# Patient Record
Sex: Female | Born: 1935 | Race: White | Hispanic: No | Marital: Single | Smoking: Former smoker
Health system: Southern US, Community
[De-identification: ages and names within clinical notes are randomized; demographics above are authoritative.]

## PROBLEM LIST (undated history)

## (undated) DIAGNOSIS — I10 Essential (primary) hypertension: Secondary | ICD-10-CM

## (undated) DIAGNOSIS — I495 Sick sinus syndrome: Secondary | ICD-10-CM

## (undated) DIAGNOSIS — H269 Unspecified cataract: Secondary | ICD-10-CM

## (undated) DIAGNOSIS — R112 Nausea with vomiting, unspecified: Secondary | ICD-10-CM

## (undated) DIAGNOSIS — Z860100 Personal history of colon polyps, unspecified: Secondary | ICD-10-CM

## (undated) DIAGNOSIS — C4490 Unspecified malignant neoplasm of skin, unspecified: Secondary | ICD-10-CM

## (undated) DIAGNOSIS — M8580 Other specified disorders of bone density and structure, unspecified site: Secondary | ICD-10-CM

## (undated) DIAGNOSIS — I480 Paroxysmal atrial fibrillation: Secondary | ICD-10-CM

## (undated) DIAGNOSIS — E785 Hyperlipidemia, unspecified: Secondary | ICD-10-CM

## (undated) DIAGNOSIS — Z9889 Other specified postprocedural states: Secondary | ICD-10-CM

## (undated) DIAGNOSIS — R55 Syncope and collapse: Secondary | ICD-10-CM

## (undated) HISTORY — DX: Sick sinus syndrome: I49.5

## (undated) HISTORY — DX: Essential (primary) hypertension: I10

## (undated) HISTORY — DX: Unspecified malignant neoplasm of skin, unspecified: C44.90

## (undated) HISTORY — PX: BREAST BIOPSY: SHX20

## (undated) HISTORY — DX: Unspecified cataract: H26.9

## (undated) HISTORY — DX: Syncope and collapse: R55

## (undated) HISTORY — DX: Personal history of colon polyps, unspecified: Z86.0100

## (undated) HISTORY — PX: COLONOSCOPY W/ BIOPSIES: SHX1374

## (undated) HISTORY — DX: Paroxysmal atrial fibrillation: I48.0

## (undated) HISTORY — PX: TOTAL ABDOMINAL HYSTERECTOMY: SHX209

## (undated) HISTORY — DX: Hyperlipidemia, unspecified: E78.5

## (undated) HISTORY — DX: Other specified disorders of bone density and structure, unspecified site: M85.80

## (undated) HISTORY — PX: HEMORRHOID SURGERY: SHX153

## (undated) HISTORY — DX: Personal history of colonic polyps: Z86.010

## (undated) HISTORY — PX: OTHER SURGICAL HISTORY: SHX169

## (undated) HISTORY — PX: APPENDECTOMY: SHX54

---

## 2004-07-08 ENCOUNTER — Ambulatory Visit: Payer: Self-pay | Admitting: Internal Medicine

## 2004-07-15 ENCOUNTER — Ambulatory Visit: Payer: Self-pay | Admitting: Internal Medicine

## 2004-10-15 ENCOUNTER — Ambulatory Visit: Payer: Self-pay | Admitting: Internal Medicine

## 2005-01-20 ENCOUNTER — Ambulatory Visit: Payer: Self-pay | Admitting: Internal Medicine

## 2005-07-19 ENCOUNTER — Ambulatory Visit: Payer: Self-pay | Admitting: Internal Medicine

## 2005-11-02 DIAGNOSIS — R55 Syncope and collapse: Secondary | ICD-10-CM

## 2005-11-02 HISTORY — DX: Syncope and collapse: R55

## 2005-12-04 ENCOUNTER — Emergency Department (HOSPITAL_COMMUNITY): Admission: EM | Admit: 2005-12-04 | Discharge: 2005-12-04 | Payer: Self-pay | Admitting: Emergency Medicine

## 2005-12-06 ENCOUNTER — Ambulatory Visit: Payer: Self-pay | Admitting: Internal Medicine

## 2005-12-08 HISTORY — PX: US ECHOCARDIOGRAPHY: HXRAD669

## 2006-01-18 ENCOUNTER — Ambulatory Visit: Payer: Self-pay | Admitting: Internal Medicine

## 2006-01-18 LAB — CONVERTED CEMR LAB
ALT: 33 units/L (ref 0–40)
AST: 41 units/L — ABNORMAL HIGH (ref 0–37)

## 2006-04-04 HISTORY — PX: OTHER SURGICAL HISTORY: SHX169

## 2006-05-24 ENCOUNTER — Ambulatory Visit: Payer: Self-pay | Admitting: Internal Medicine

## 2006-05-24 LAB — CONVERTED CEMR LAB
ALT: 27 units/L (ref 0–40)
Calcium: 9.1 mg/dL (ref 8.4–10.5)
Chloride: 105 meq/L (ref 96–112)
GFR calc Af Amer: 89 mL/min
GFR calc non Af Amer: 74 mL/min
LDL Cholesterol: 91 mg/dL (ref 0–99)
Total CK: 49 units/L (ref 7–177)
VLDL: 12 mg/dL (ref 0–40)

## 2006-06-03 DIAGNOSIS — Z950 Presence of cardiac pacemaker: Secondary | ICD-10-CM

## 2006-06-20 ENCOUNTER — Ambulatory Visit (HOSPITAL_COMMUNITY): Admission: RE | Admit: 2006-06-20 | Discharge: 2006-06-21 | Payer: Self-pay | Admitting: *Deleted

## 2006-08-31 ENCOUNTER — Ambulatory Visit: Payer: Self-pay | Admitting: Internal Medicine

## 2006-08-31 ENCOUNTER — Encounter: Payer: Self-pay | Admitting: Internal Medicine

## 2006-09-28 ENCOUNTER — Encounter: Payer: Self-pay | Admitting: Internal Medicine

## 2006-10-03 ENCOUNTER — Ambulatory Visit: Payer: Self-pay | Admitting: Internal Medicine

## 2006-10-03 DIAGNOSIS — Z9189 Other specified personal risk factors, not elsewhere classified: Secondary | ICD-10-CM | POA: Insufficient documentation

## 2006-10-03 DIAGNOSIS — M899 Disorder of bone, unspecified: Secondary | ICD-10-CM | POA: Insufficient documentation

## 2006-10-03 DIAGNOSIS — I10 Essential (primary) hypertension: Secondary | ICD-10-CM

## 2006-10-03 DIAGNOSIS — R928 Other abnormal and inconclusive findings on diagnostic imaging of breast: Secondary | ICD-10-CM | POA: Insufficient documentation

## 2006-10-03 DIAGNOSIS — E7800 Pure hypercholesterolemia, unspecified: Secondary | ICD-10-CM

## 2006-10-03 DIAGNOSIS — M949 Disorder of cartilage, unspecified: Secondary | ICD-10-CM

## 2006-10-09 LAB — CONVERTED CEMR LAB
ALT: 37 units/L — ABNORMAL HIGH (ref 0–35)
AST: 44 units/L — ABNORMAL HIGH (ref 0–37)
BUN: 15 mg/dL (ref 6–23)
CO2: 30 meq/L (ref 19–32)
GFR calc Af Amer: 124 mL/min
Potassium: 4.7 meq/L (ref 3.5–5.1)
Sodium: 141 meq/L (ref 135–145)

## 2007-02-09 ENCOUNTER — Ambulatory Visit: Payer: Self-pay | Admitting: Internal Medicine

## 2007-02-12 LAB — CONVERTED CEMR LAB
ALT: 24 units/L (ref 0–35)
AST: 33 units/L (ref 0–37)
Basophils Relative: 0.6 % (ref 0.0–1.0)
Bilirubin, Direct: 0.1 mg/dL (ref 0.0–0.3)
CO2: 27 meq/L (ref 19–32)
Calcium: 9 mg/dL (ref 8.4–10.5)
Chloride: 103 meq/L (ref 96–112)
Creatinine, Ser: 0.9 mg/dL (ref 0.4–1.2)
Eosinophils Absolute: 0.1 10*3/uL (ref 0.0–0.6)
Eosinophils Relative: 1.8 % (ref 0.0–5.0)
Glucose, Bld: 94 mg/dL (ref 70–99)
HCT: 39.4 % (ref 36.0–46.0)
Iron: 88 ug/dL (ref 42–145)
MCV: 84.8 fL (ref 78.0–100.0)
Neutrophils Relative %: 59.4 % (ref 43.0–77.0)
Platelets: 240 10*3/uL (ref 150–400)
RBC: 4.65 M/uL (ref 3.87–5.11)
Sodium: 141 meq/L (ref 135–145)
Total Bilirubin: 0.9 mg/dL (ref 0.3–1.2)
Total CHOL/HDL Ratio: 2.1
Total Protein: 6.7 g/dL (ref 6.0–8.3)
VLDL: 10 mg/dL (ref 0–40)
WBC: 6.2 10*3/uL (ref 4.5–10.5)

## 2007-03-26 ENCOUNTER — Encounter: Payer: Self-pay | Admitting: Internal Medicine

## 2007-05-04 ENCOUNTER — Ambulatory Visit: Payer: Self-pay | Admitting: Internal Medicine

## 2007-05-11 ENCOUNTER — Telehealth: Payer: Self-pay | Admitting: Internal Medicine

## 2007-07-03 ENCOUNTER — Encounter: Payer: Self-pay | Admitting: Internal Medicine

## 2007-09-24 ENCOUNTER — Encounter: Payer: Self-pay | Admitting: Internal Medicine

## 2007-11-06 ENCOUNTER — Ambulatory Visit: Payer: Self-pay | Admitting: Internal Medicine

## 2007-11-27 ENCOUNTER — Encounter: Payer: Self-pay | Admitting: Internal Medicine

## 2007-11-27 ENCOUNTER — Ambulatory Visit: Payer: Self-pay | Admitting: Internal Medicine

## 2007-11-27 LAB — HM COLONOSCOPY

## 2007-11-29 ENCOUNTER — Encounter: Payer: Self-pay | Admitting: Internal Medicine

## 2008-03-24 ENCOUNTER — Encounter: Payer: Self-pay | Admitting: Internal Medicine

## 2008-04-04 DIAGNOSIS — C4490 Unspecified malignant neoplasm of skin, unspecified: Secondary | ICD-10-CM

## 2008-04-04 HISTORY — DX: Unspecified malignant neoplasm of skin, unspecified: C44.90

## 2008-06-13 ENCOUNTER — Ambulatory Visit: Payer: Self-pay | Admitting: Family Medicine

## 2008-06-13 DIAGNOSIS — R109 Unspecified abdominal pain: Secondary | ICD-10-CM | POA: Insufficient documentation

## 2008-09-29 ENCOUNTER — Encounter: Payer: Self-pay | Admitting: Internal Medicine

## 2008-09-29 LAB — CONVERTED CEMR LAB
Albumin: 4.3 g/dL
Alkaline Phosphatase: 72 units/L
BUN: 21 mg/dL
Creatinine, Ser: 0.8 mg/dL
Glucose, Bld: 97 mg/dL
HDL: 99 mg/dL
LDL Cholesterol: 107 mg/dL
Potassium, serum: 4.5 mmol/L
Total Protein: 7.5 g/dL
Triglycerides: 102 mg/dL

## 2008-10-09 ENCOUNTER — Ambulatory Visit: Payer: Self-pay | Admitting: Family Medicine

## 2008-10-09 ENCOUNTER — Encounter: Payer: Self-pay | Admitting: Internal Medicine

## 2008-11-10 ENCOUNTER — Encounter: Payer: Self-pay | Admitting: Internal Medicine

## 2008-11-20 ENCOUNTER — Encounter (INDEPENDENT_AMBULATORY_CARE_PROVIDER_SITE_OTHER): Payer: Self-pay | Admitting: *Deleted

## 2009-06-15 ENCOUNTER — Encounter: Payer: Self-pay | Admitting: Internal Medicine

## 2009-06-16 ENCOUNTER — Encounter: Payer: Self-pay | Admitting: Internal Medicine

## 2009-06-17 ENCOUNTER — Encounter: Payer: Self-pay | Admitting: Internal Medicine

## 2009-06-17 LAB — CONVERTED CEMR LAB
ALT: 32 units/L
AST: 44 units/L
Albumin: 4.1 g/dL
Alkaline Phosphatase: 75 units/L
Chloride, Serum: 104 mmol/L
Cholesterol: 208 mg/dL
Creatinine, Ser: 0.8 mg/dL
Glucose, Bld: 98 mg/dL
Potassium, serum: 5.4 mmol/L
Total Bilirubin: 0.6 mg/dL

## 2009-06-23 ENCOUNTER — Ambulatory Visit: Payer: Self-pay | Admitting: Internal Medicine

## 2009-06-24 LAB — CONVERTED CEMR LAB
Basophils Relative: 0.9 % (ref 0.0–3.0)
HCT: 41.9 % (ref 36.0–46.0)
Hemoglobin: 13.7 g/dL (ref 12.0–15.0)
Lymphocytes Relative: 27.1 % (ref 12.0–46.0)
Lymphs Abs: 1.5 10*3/uL (ref 0.7–4.0)
Monocytes Relative: 10.5 % (ref 3.0–12.0)
Neutro Abs: 3.2 10*3/uL (ref 1.4–7.7)
RBC: 4.71 M/uL (ref 3.87–5.11)

## 2009-09-23 ENCOUNTER — Ambulatory Visit: Payer: Self-pay | Admitting: Internal Medicine

## 2010-01-09 ENCOUNTER — Encounter: Payer: Self-pay | Admitting: Internal Medicine

## 2010-01-21 ENCOUNTER — Ambulatory Visit: Payer: Self-pay | Admitting: Internal Medicine

## 2010-04-22 ENCOUNTER — Ambulatory Visit
Admission: RE | Admit: 2010-04-22 | Discharge: 2010-04-22 | Payer: Self-pay | Source: Home / Self Care | Attending: Internal Medicine | Admitting: Internal Medicine

## 2010-04-22 ENCOUNTER — Encounter: Payer: Self-pay | Admitting: Internal Medicine

## 2010-04-22 DIAGNOSIS — I4821 Permanent atrial fibrillation: Secondary | ICD-10-CM | POA: Insufficient documentation

## 2010-04-22 DIAGNOSIS — I4891 Unspecified atrial fibrillation: Secondary | ICD-10-CM | POA: Insufficient documentation

## 2010-05-04 NOTE — Assessment & Plan Note (Signed)
Summary: shingles vac/cbs  Nurse Visit   Allergies: No Known Drug Allergies  Immunizations Administered:  Zostavax # 1:    Vaccine Type: Zostavax    Site: RIGHT [place]    Mfr: Merck    Dose: 0.65    Route: [place]    Given by: Jeremy Johann CMA    Exp. Date: 10/28/2010    Lot #: [id number]    VIS given: 01/14/05 given September 23, 2009.  Orders Added: 1)  Zoster (Shingles) Vaccine Live [90736] 2)  Admin 1st Vaccine [phone]

## 2010-05-04 NOTE — Miscellaneous (Signed)
Summary: Device preload  Clinical Lists Changes  Observations: Added new observation of PPM INDICATN: CHB (01/09/2010 12:51) Added new observation of MAGNET RTE: BOL 85 ERI 65 (01/09/2010 12:51) Added new observation of PPMLEADSTAT2: active (01/09/2010 12:51) Added new observation of PPMLEADSER2: [id number] (01/09/2010 12:51) Added new observation of PPMLEADMOD2: 5076  (01/09/2010 12:51) Added new observation of PPMLEADDOI2: 06/20/2006  (01/09/2010 12:51) Added new observation of PPMLEADLOC2: RV  (01/09/2010 12:51) Added new observation of PPMLEADSTAT1: active  (01/09/2010 12:51) Added new observation of PPMLEADSER1: [id number]  (01/09/2010 12:51) Added new observation of PPMLEADMOD1: 5076  (01/09/2010 12:51) Added new observation of PPMLEADDOI1: 06/20/2006  (01/09/2010 12:51) Added new observation of PPMLEADLOC1: RA  (01/09/2010 12:51) Added new observation of PPM IMP MD: Charlynn Court  (01/09/2010 12:51) Added new observation of PPM DOI: 06/20/2006  (01/09/2010 12:51) Added new observation of PPM SERL#: [id number]  (01/09/2010 12:51) Added new observation of PPM MODL#: [id number]  (01/09/2010 65:78) Added new observation of PACEMAKERMFG: Medtronic  (01/09/2010 12:51) Added new observation of PPM REFER MD: Vonna Drafts  (01/09/2010 12:51) Added new observation of PACEMAKER MD: Hillis Range, MD  (01/09/2010 12:51)      PPM Specifications Following MD:  Hillis Range, MD     Referring MD:  Vonna Drafts PPM Vendor:  Medtronic     PPM Model Number:  ADDR01     PPM Serial Number:  [id number] PPM DOI:  06/20/2006     PPM Implanting MD:  Charlynn Court  Lead 1    Location: RA     DOI: 06/20/2006     Model #: [id number]     Serial #: [id number]     Status: active Lead 2    Location: RV     DOI: 06/20/2006     Model #: [id number]     Serial #: [id number]     Status: active  Magnet Response Rate:  BOL 85 ERI 65  Indications:  CHB

## 2010-05-04 NOTE — Miscellaneous (Signed)
Summary: LABS FROM [facility]  Clinical Lists Changes  Observations: Added new observation of SGPT (ALT): 32 units/L (06/17/2009 8:04) Added new observation of SGOT (AST): 44 units/L (06/17/2009 8:04) Added new observation of TRIGLYCERIDE: 62 mg/dL (13/11/6576 4:69) Added new observation of HDL: 104 mg/dL (62/95/2841 3:24) Added new observation of LDL: 91 mg/dL (40/01/2724 3:66) Added new observation of CHOLESTEROL: 208 mg/dL (44/06/4740 5:95) Added new observation of PROTEIN, TOT: 7.4 g/dL (63/87/5643 3:29) Added new observation of ALBUMIN: 4.1 g/dL (51/88/4166 0:63) Added new observation of BILI TOTAL: 0.6 mg/dL (01/60/1093 2:35) Added new observation of ALK PHOS: 75 units/L (06/17/2009 8:03) Added new observation of CALCIUM: 10.0 mg/dL (57/32/2025 4:27) Added new observation of BG RANDOM: 98 mg/dL (09/25/7626 3:15) Added new observation of CREATININE: 0.8 mg/dL (17/61/6073 7:10) Added new observation of BUN: 23 mg/dL (62/69/4854 6:27) Added new observation of CO2 TOTAL: 31 mmol/L (06/17/2009 8:03) Added new observation of CHLORIDE: 104 mmol/L (06/17/2009 8:03) Added new observation of POTASSIUM: 5.4 mmol/L (06/17/2009 8:03) Added new observation of SODIUM: 142 mmol/L (06/17/2009 8:03)      Chemistry Labs Test Date: 06/17/2009                      Value Units        H/L   Reference  Sodium:             142   mmol/L             (137-145) Potassium:          5.4   mmol/L        H    (3.6-5.0) Chloride:           104   mmol/L             (101-111) CO2:                31    mmol/L             (22-31) BUN:                23    mg/dL              (0-35) Creatinine:         0.8   mg/dL              (0.0-9.3) Glucose-random:     98    mg/dL              (81-829) Calcium (total):    10.0  mg/dL              (9-37.1) Alkaline P'tase:    75    U/L                (10-120) T. Bili:            0.6   mg/dL              (6.9-6.7) Albumin:            4.1   g/dL                (3-5) Total Protein:      7.4   g/dL          H    (4-7)  Lab name:           [facility]    Lipid Panel Test Date: 06/17/2009  Value        Units        H/L   Reference  Cholesterol:          208          mg/dL              (161-096) LDL Cholesterol:      91           mg/dL              (04-540) HDL Cholesterol:      104          mg/dL         H    (98-11) Triglyceride:         62           mg/dL              (91-478) SGOT (AST)            44                              SGPT (ALT)            32                               Lab name:             [facility]  Appended Document: LABS FROM [facility] not seen for a ROV in > 2 years  advise patient:: rec a check up with me, no urgent   Appended Document: LABS FROM [facility] discussed w/ patient office visit scheduled

## 2010-05-04 NOTE — Assessment & Plan Note (Signed)
Summary: F/U/CDJ   Vital Signs:  Patient profile:   77 year old female Height:      66 inches Weight:      126.6 pounds BMI:     20.51 Pulse rate:   54 / minute BP sitting:   118 / 70  Vitals Entered By: [email or web address] CC: rov   History of Present Illness: routine office visit Hyperlipidemia-- f/u by cardiology Hypertension-- normal ambulatory BPs  Osteopenia-- last dexa 2010 , on a holiday from actonel, good medication compliance w/ Ca and vit D Pace maker checked q 6 months, note from cards reviewed   Allergies: No Known Drug Allergies  Past History:  Past Medical History: Hyperlipidemia Hypertension Osteopenia Syncope 11-2005: ECHO Ao sclerosis (observe per cards) Pacemaker  L leg skin cancer 2010, SCC  Past Surgical History: Reviewed history from 02/09/2007 and no changes required. Hemorrhoidectomy Hysterectomy Oophorectomy Appendectomy  Family History: colon ca--no Breast ca-- sister   Social History: Single live by self still drives ADL independent Tobacco--no ETOH--socially exercise-- used to walk more until she had leg surgery for skin ca diet-- very healthy  Review of Systems CV:  Denies chest pain or discomfort and swelling of feet. Resp:  Denies cough and shortness of breath. GI:  Denies bloody stools, diarrhea, nausea, and vomiting. GU:  Denies dysuria and hematuria; no vag d/c or spotting . Neuro:  Denies headaches. Psych:  Denies anxiety and depression; insomnia-- no.  Physical Exam  General:  alert, well-developed, and well-nourished.   Neck:  no masses, no thyromegaly, and normal carotid upstroke.   Lungs:  normal respiratory effort, no intercostal retractions, no accessory muscle use, and normal breath sounds.   Heart:  normal rate, regular rhythm, and no murmur.   Abdomen:  soft, non-tender, no distention, and no masses.   Extremities:  no edema Psych:  Oriented X3, memory intact for recent and remote, normally interactive, good eye  contact, not anxious appearing, and not depressed appearing.     Impression & Recommendations:  Problem # 1:  OSTEOPENIA (ICD-733.90) last dexa 2010 , on a holiday from actonel, good medication compliance w/ Ca and vit D recommend daily exercises  Orders: TLB-TSH (Thyroid Stimulating Hormone) (84443-TSH) T-Vitamin D (25-Hydroxy) (78295-62130)  Problem # 2:  HEALTH SCREENING (ICD-V70.0) chart reviewed  Td 06 pneumonia shot 2006  interested in a  shingles shot, it is in back order,see  instructions  several Cscopes  , last 8- 2009, benign polyp, next in 5 years   has a yearly breast exam with her surgeon in [facility] MMG abnormal 04-2006, Bx non malignant. normal MMG 2010,  04-2009  PAP no in several years , never had an abnormal PAP.  Hysterectomy in her 47s due to fibroids and endometriosis . PAPA if so desire     Problem # 3:  HYPERTENSION (ICD-401.9) well-controlled Her updated medication list for this problem includes:    Altace 5 Mg Caps (Ramipril) .Marland Kitchen... 1 by mouth qd  Orders: Venipuncture (86578) TLB-CBC Platelet - w/Differential (85025-CBCD)  Problem # 4:  HYPERLIPIDEMIA (ICD-272.4) per cardiology, good  medication compliance Her updated medication list for this problem includes:    Zocor 20 Mg Tabs (Simvastatin) .Marland Kitchen... Take one tablet daily  Labs Reviewed: SGOT: 44 (06/17/2009)   SGPT: 32 (06/17/2009)   HDL:104 (06/17/2009), 99 (46/96/2952)  LDL:91 (06/17/2009), 107 (84/13/2440)  Chol:208 (06/17/2009), 226 (09/29/2008)  Trig:62 (06/17/2009), 102 (09/29/2008)  Problem # 5:  PACEMAKER, PERMANENT (ICD-V45.01) sees cards routinely   Complete Medication List:  1)  Altace 5 Mg Caps (Ramipril) .Marland Kitchen.. 1 by mouth qd 2)  Calcium  3)  Basa  4)  Mvi  5)  Metrocream Crea (Metronidazole crea) .... Bid 6)  Zocor 20 Mg Tabs (Simvastatin) .... Take one tablet daily 7)  Fish Oil  .Marland Kitchen.. 1 daily  Patient Instructions: 1)  call in 3 months about the shingles shot 2)  Please  schedule a follow-up appointment in 1 year.

## 2010-05-04 NOTE — Letter (Signed)
Summary: [facility]   Imported By: Lanelle Bal 06/22/2009 13:17:41  _____________________________________________________________________  External Attachment:    Type:   Image     Comment:   External Document

## 2010-05-04 NOTE — Procedures (Signed)
Summary: pacer check/medtronic   Current Medications (verified): 1)  Altace 5 Mg  Caps (Ramipril) .Marland Kitchen.. 1 By Mouth Qd 2)  Calcium 3)  Basa 4)  Mvi 5)  Metrocream   Crea (Metronidazole Crea) .... Bid 6)  Zocor 20 Mg Tabs (Simvastatin) .... Take One Tablet Daily 7)  Fish Oil .Marland Kitchen.. 1 Daily  Allergies (verified): No Known Drug Allergies  PPM Specifications Following MD:  Hillis Range, MD     Referring MD:  Vonna Drafts PPM Vendor:  Medtronic     PPM Model Number:  ADDR01     PPM Serial Number:  [id number] PPM DOI:  06/20/2006     PPM Implanting MD:  Charlynn Court  Lead 1    Location: RA     DOI: 06/20/2006     Model #: 4098     Serial #: [id number]     Status: active Lead 2    Location: RV     DOI: 06/20/2006     Model #: 6213     Serial #: [id number]     Status: active  Magnet Response Rate:  BOL 85 ERI 65  Indications:  CHB   PPM Follow Up Remote Check?  No Battery Voltage:  2.78 V     Battery Est. Longevity:  7 years     Pacer Dependent:  No       PPM Device Measurements Atrium  Amplitude: 2.8 mV, Impedance: 459 ohms, Threshold: 0.375 V at 0.4 msec Right Ventricle  Amplitude: 11.2 mV, Impedance: 469 ohms, Threshold: 0.75 V at 0.4 msec  Episodes MS Episodes:  25     Percent Mode Switch:  <0.1%     Coumadin:  No Ventricular High Rate:  1     Atrial Pacing:  18%     Ventricular Pacing:  0.2%  Parameters Mode:  DDDR+     Lower Rate Limit:  60     Upper Rate Limit:  130 Paced AV Delay:  220     Sensed AV Delay:  220 Next Cardiology Appt Due:  04/04/2010 Tech Comments:  No parameter changes.  Device function normal. The longest A-fib episode was 3 hours, -coumadin. ROV 3months with Dr. Johney Frame. Altha Harm, LPN  January 21, 2010 9:49 AM   Appended Document: pacer check/medtronic Will discuss coumadin upon evaluation in clinic.

## 2010-05-06 NOTE — Assessment & Plan Note (Signed)
Summary: device/saf   Referring Provider:  Dr Swaziland   History of Present Illness: Peggy Mendez is a pleasant 77 yo WF with a h/o presyncope and Mobitz II second degree AV block who presentst today to establish care in the pacemaker clinic.  She reports doing very well.  She remains quite active and denies symptoms of  chest pain, shortness of breath, orthopnea, PND, lower extremity edema, dizziness, presyncope, syncope, or neurologic sequela. She reports having tachypalpitatiosn 12/05/09 lasting 4 hours but denies other symptoms.  The patient is tolerating medications without difficulties and is otherwise without complaint today.   Current Medications (verified): 1)  Altace 5 Mg  Caps (Ramipril) .Marland Kitchen.. 1 By Mouth Qd 2)  Calcium 3)  Basa 4)  Mvi 5)  Metrocream   Crea (Metronidazole Crea) .... Bid 6)  Zocor 20 Mg Tabs (Simvastatin) .... Take One Tablet Daily 7)  Fish Oil .Marland Kitchen.. 1 Daily  Allergies (verified): No Known Drug Allergies  Past History:  Past Medical History: HYPERTENSION HYPERLIPIDEMIA  ABDOMINAL PAIN  ABNORMAL MAMMOGRAM PACEMAKER, PERMANENT for mobitz II 2nd degree AV block (MDT) BREAST BIOPSY, HX OF COLONIC POLYPS  OSTEOPENIA paroxysmal atrial fibrillation Syncope 11-2005: ECHO Ao sclerosis (observe per cards)  L leg skin cancer 2010, SCC  Past Surgical History: PPM for mobitz II second degree AV block2008  Hemorrhoidectomy  Hysterectomy  Oophorectomy  Appendectomy  Family History: Reviewed history from 06/23/2009 and no changes required. colon ca--no Breast ca-- sister   Social History: Reviewed history from 06/23/2009 and no changes required. Single live by self still drives ADL independent Tobacco--no ETOH--socially exercise-- used to walk more until she had leg surgery for skin ca diet-- very healthy  Review of Systems       All systems are reviewed and negative except as listed in the HPI.   Vital Signs:  Patient profile:   77 year old  female Height:      66 inches Weight:      129 pounds BMI:     20.90 Pulse rate:   82 / minute Resp:     16 per minute BP sitting:   160 / 71  (left arm)  Vitals Entered By: Marrion Coy, CNA (April 22, 2010 10:46 AM)  Physical Exam  General:  alert, well-developed, and well-nourished.   Head:  normocephalic and atraumatic Eyes:  PERRLA/EOM intact; conjunctiva and lids normal. Mouth:  Teeth, gums and palate normal. Oral mucosa normal. Neck:  supple Chest Wall:  pacemaker pocket is well healed Lungs:  Clear bilaterally to auscultation and percussion. Heart:  normal rate, regular rhythm, and no murmur.   Abdomen:  Bowel sounds positive; abdomen soft and non-tender without masses, organomegaly, or hernias noted. No hepatosplenomegaly. Msk:  Back normal, normal gait. Muscle strength and tone normal. Extremities:  No clubbing or cyanosis. Neurologic:  Alert and oriented x 3. Skin:  Intact without lesions or rashes. Psych:  Normal affect.   PPM Specifications Following MD:  Hillis Range, MD     Referring MD:  Vonna Drafts PPM Vendor:  Medtronic     PPM Model Number:  ADDR01     PPM Serial Number:  [id number] PPM DOI:  06/20/2006     PPM Implanting MD:  Charlynn Court  Lead 1    Location: RA     DOI: 06/20/2006     Model #: 1308     Serial #: [id number]     Status: active Lead 2    Location: RV  DOI: 06/20/2006     Model #: 7829     Serial #: [id number]     Status: active  Magnet Response Rate:  BOL 85 ERI 65  Indications:  CHB   PPM Follow Up Battery Voltage:  2.78 V     Battery Est. Longevity:  7 YRS     Pacer Dependent:  No       PPM Device Measurements Atrium  Amplitude: 5.60 mV, Impedance: 458 ohms, Threshold: 0.50 V at 0.40 msec Right Ventricle  Amplitude: 31.36 mV, Impedance: 502 ohms, Threshold: 0.750 V at 0.40 msec  Episodes Peggy Episodes:  10     Percent Mode Switch:  <0.1%     Coumadin:  No Ventricular High Rate:  0     Atrial Pacing:  11.4%      Ventricular Pacing:  0.2%  Parameters Mode:  MVP     Lower Rate Limit:  60     Upper Rate Limit:  130 Paced AV Delay:  220     Sensed AV Delay:  220 Next Remote Date:  07/22/2010     Next Cardiology Appt Due:  04/05/2011 Tech Comments:  10 MODE SWITCHES--ALL LESS THAN 1 MINUTE.  NORMAL DEVICE FUNCTION. CHANGED RA OUTPUT FROM 1.5 TO 2.0 AND RV OUTPUT FROM 2.0 TO 2.5 V. CARELINK 07-22-10 AND ROV IN 12 MTHS W/JA. Vella Kohler  April 22, 2010 11:15 AM MD Comments:  agree she did have an episode of afib documented by pacemaker also 9/11 which lasted < 4 hours and correlates with her symptoms  Impression & Recommendations:  Problem # 1:  PACEMAKER, PERMANENT (ICD-V45.01) normal pacemaker function for mobitz II second degree AV block  no changes  Problem # 2:  HYPERTENSION (ICD-401.9) above goal, though she reports good BP control at home salt restriction she will check her BP at home and alert Dr Swaziland if it remains elevated  Problem # 3:  PAROXYSMAL ATRIAL FIBRILLATION (ICD-427.31) a single episode of symptomatic afib 9/11 which is confirmed by PPM interrogation Continue ASA at this time.  I would not change our therapies unless her afib burden increases  Patient Instructions: 1)  Your physician recommends that you continue on your current medications as directed. Please refer to the Current Medication list given to you today. 2)  Your physician wants you to follow-up in: 1 year.  You will receive a reminder letter in the mail two months in advance. If you don't receive a letter, please call our office to schedule the follow-up appointment.

## 2010-05-20 NOTE — Cardiovascular Report (Signed)
Summary: Office Visit   Office Visit   Imported By: Roderic Ovens 05/12/2010 10:18:48  _____________________________________________________________________  External Attachment:    Type:   Image     Comment:   External Document

## 2010-07-22 ENCOUNTER — Encounter: Payer: Self-pay | Admitting: *Deleted

## 2010-07-25 ENCOUNTER — Encounter: Payer: Self-pay | Admitting: *Deleted

## 2010-07-27 ENCOUNTER — Other Ambulatory Visit: Payer: Self-pay | Admitting: Internal Medicine

## 2010-07-27 ENCOUNTER — Ambulatory Visit (INDEPENDENT_AMBULATORY_CARE_PROVIDER_SITE_OTHER): Payer: Medicare Other | Admitting: *Deleted

## 2010-07-27 DIAGNOSIS — I442 Atrioventricular block, complete: Secondary | ICD-10-CM

## 2010-07-27 DIAGNOSIS — Z950 Presence of cardiac pacemaker: Secondary | ICD-10-CM

## 2010-07-27 DIAGNOSIS — I4891 Unspecified atrial fibrillation: Secondary | ICD-10-CM

## 2010-07-29 ENCOUNTER — Encounter: Payer: Self-pay | Admitting: Internal Medicine

## 2010-07-30 ENCOUNTER — Ambulatory Visit (INDEPENDENT_AMBULATORY_CARE_PROVIDER_SITE_OTHER): Payer: Medicare Other | Admitting: Internal Medicine

## 2010-07-30 ENCOUNTER — Encounter: Payer: Self-pay | Admitting: Internal Medicine

## 2010-07-30 DIAGNOSIS — M899 Disorder of bone, unspecified: Secondary | ICD-10-CM

## 2010-07-30 DIAGNOSIS — I10 Essential (primary) hypertension: Secondary | ICD-10-CM

## 2010-07-30 DIAGNOSIS — M949 Disorder of cartilage, unspecified: Secondary | ICD-10-CM

## 2010-07-30 DIAGNOSIS — Z950 Presence of cardiac pacemaker: Secondary | ICD-10-CM

## 2010-07-30 DIAGNOSIS — E785 Hyperlipidemia, unspecified: Secondary | ICD-10-CM

## 2010-07-30 DIAGNOSIS — Z0000 Encounter for general adult medical examination without abnormal findings: Secondary | ICD-10-CM | POA: Insufficient documentation

## 2010-07-30 NOTE — Assessment & Plan Note (Addendum)
Labs  Will call for RF

## 2010-07-30 NOTE — Assessment & Plan Note (Signed)
DEA 08-2006 and 10-2008 -------> osteopenia @ hip Plan: Ca and Vit D. Repeat DEXA ~ 7-12 Last vit D level wnl

## 2010-07-30 NOTE — Progress Notes (Signed)
  Subjective:    Patient ID: Peggy Mendez, female    DOB: Aug 03, 1926, 77 y.o.   MRN: [id number]  HPI Here for Medicare AWV:  1. Risk factors based on Past M, S, F history: reviewed  2. Physical Activities:  Very active  3. Depression/mood:  No problems noted or reported  4. Hearing:  No problems noted or reported  5. ADL's:  independent 6. Fall Risk: low risk for age 71. Home Safety: does feelsafe at home  8. Height, weight, &visual acuity:  See VS, vision corrected w/ glasses  9. Counseling: provided 10. Labs ordered based on risk factors: yes  11. Referral Coordination:if needed 12.  Care Plan, see a/p  13.   Cognitive Assessment: motor and cognition seem appropiate for age   In addition we discussed the following: HTN-- amb BPs wnl , good med compliance Cholesterol meds-- good med compliance, no myalgias , great life style  Pacemaker-- no recent activity, f/u by cards routinely    Past Medical History  Diagnosis Date  . Hypertension   . Hyperlipidemia   . Pacemaker     permanent for mobitz II 2nd degree AV block (MDT)  . History of colonic polyps   . Osteopenia   . Paroxysmal atrial fibrillation   . Syncope 11/2005    ECHO ao sclerosis (observe per cards)  . Skin cancer 2010    Left, SCC     Review of Systems  Constitutional: Negative for fever and fatigue.  Respiratory: Negative for cough and shortness of breath.   Cardiovascular: Negative for chest pain, palpitations and leg swelling.  Gastrointestinal: Negative for nausea, abdominal pain, diarrhea and blood in stool.  Genitourinary: Negative for dysuria, hematuria, vaginal bleeding and vaginal discharge.       SBE normal       Objective:   Physical Exam  Constitutional: She is oriented to person, place, and time. She appears well-developed and well-nourished. No distress.  Eyes: No scleral icterus.  Neck: No thyromegaly present.  Cardiovascular: Normal rate, regular rhythm and normal heart sounds.   No  murmur heard. Pulmonary/Chest: Effort normal and breath sounds normal. No respiratory distress. She has no wheezes. She has no rales. She exhibits no tenderness.  Abdominal: Soft. Bowel sounds are normal. She exhibits no distension and no mass. There is no tenderness. There is no rebound.  Genitourinary:       Breast exam WNL w/ no axilary LADs  Musculoskeletal: She exhibits no edema.  Neurological: She is alert and oriented to person, place, and time.  Skin: Skin is warm and dry. She is not diaphoretic.  Psychiatric: She has a normal mood and affect. Her behavior is normal. Judgment and thought content normal.          Assessment & Plan:

## 2010-07-30 NOTE — Assessment & Plan Note (Addendum)
Td , next 2016 Had a shingles shot Pneumonia shot UTD H/o hysterectomy for benign reason remotely, since then had ? 1 PAP... It was normal: no further PAPs H/o an abnormal MMG and breast Bx, last 3 MMGs neg per pt, last MMG ~ 04-2010; breast exam neg today: cont w/ SBE Last Cscope 8-09, 2 polyps, next 5 years Diet, exercise discussed and encouraged

## 2010-07-30 NOTE — Assessment & Plan Note (Signed)
BP slt elevated today, amb BPs great, no change

## 2010-07-30 NOTE — Assessment & Plan Note (Addendum)
Per cards (Allred, also needs to see Dr Swaziland once a year)

## 2010-07-31 LAB — ALT: ALT: 17 U/L (ref 0–35)

## 2010-07-31 LAB — CBC WITH DIFFERENTIAL/PLATELET
Eosinophils Absolute: 0.2 10*3/uL (ref 0.0–0.7)
HCT: 42.7 % (ref 36.0–46.0)
Hemoglobin: 13.7 g/dL (ref 12.0–15.0)
Lymphs Abs: 2 10*3/uL (ref 0.7–4.0)
MCH: 28.4 pg (ref 26.0–34.0)
MCV: 88.6 fL (ref 78.0–100.0)
Monocytes Absolute: 0.9 10*3/uL (ref 0.1–1.0)
Monocytes Relative: 11 % (ref 3–12)
Neutrophils Relative %: 64 % (ref 43–77)
RBC: 4.82 MIL/uL (ref 3.87–5.11)

## 2010-07-31 LAB — LIPID PANEL
Cholesterol: 215 mg/dL — ABNORMAL HIGH (ref 0–200)
Triglycerides: 72 mg/dL (ref <150)
VLDL: 14 mg/dL (ref 0–40)

## 2010-07-31 LAB — BASIC METABOLIC PANEL
BUN: 23 mg/dL (ref 6–23)
CO2: 24 mEq/L (ref 19–32)
Calcium: 9.5 mg/dL (ref 8.4–10.5)
Glucose, Bld: 82 mg/dL (ref 70–99)
Sodium: 137 mEq/L (ref 135–145)

## 2010-07-31 LAB — AST: AST: 28 U/L (ref 0–37)

## 2010-07-31 LAB — TSH: TSH: 2.723 u[IU]/mL (ref 0.350–4.500)

## 2010-08-20 NOTE — H&P (Signed)
NAMEFARRIS, Peggy Mendez            ACCOUNT NO.:  [id number]   MEDICAL RECORD NO.:  [id number]           PATIENT TYPE:   LOCATION:                                 FACILITY:   PHYSICIAN:  Elmore Guise., M.D.DATE OF BIRTH:  [birth year]   DATE OF ADMISSION:  06/20/2006  DATE OF DISCHARGE:                              HISTORY & PHYSICAL   INDICATION FOR ADMISSION:  Presyncope with palpitations.  Event monitor  showing episodes of AV block and ventricular standstill.   HISTORY OF PRESENT ILLNESS:  Ms. Peggy Mendez is a very pleasant 77-year-  old white female with a past medical history of syncope and hypertension  who has been followed by Dr. Swaziland.  Her workup has included an  echocardiogram showing normal LV size and function with an EF of 55-60%.  She also had an event monitor which showed evidence of third-degree  heart block and ventricular standstill.  She will be admitted for  pacemaker implant.  She denies any exertional chest pain.  She does try  to stay active; she either walks three miles daily or works out on a  stationary bike for 30 minutes at a time.  Her spells are infrequent  however have been debilitating when they happen.  She denies any  orthopnea or PND.  Her last two events lasted for seconds at a time.  Her longest spell lasted approximately 30 seconds.  She denies any fever  or cough.   REVIEW OF SYSTEMS:  Otherwise negative.   CURRENT MEDICATIONS:  1. Vytorin 10/10 mg daily.  2. Actonel 35 mg weekly.  3. Multivitamin once daily.  4. Caltrate with vitamin D once daily.  5. Aspirin 81 mg daily.  6. Altace 5 mg daily.   ALLERGIES:  None.   FAMILY HISTORY:  Positive for heart disease with her mother, father, and  brothers.   SOCIAL HISTORY:  She is retired.  She previously worked at [employer].  She is single.  She exercises daily, quit smoking in 1990, and  does drink an occasional alcoholic beverage.   PHYSICAL EXAMINATION:  VITAL SIGNS:  Her  weight is 128 pounds.  Her  blood pressure is 170/100.  Her heart rate is 92 and regular.  GENERAL:  In general, she is a very pleasant elderly white female, alert  and oriented x4, in no acute distress.  NECK:  She has no JVD.  LUNGS:  Clear.  HEART:  Regular with a normal S1-S2.  ABDOMEN:  Soft, nontender, nondistended.  EXTREMITIES:  Warm with 2+ pulses and no significant edema.   Her event monitor was evaluated and it showed evidence of AV  dissociation and ventricular standstill with episodes of atrial flutter  and recurrent AV dissociation.  Otherwise, her event monitor noted sinus  rhythm and sinus tachycardia.  Her blood work is pending at the time of  dictation.   IMPRESSION:  1. A history of atrioventricular dissociation with presyncope.  2. Hypertension.  3. Dyslipidemia.   PLAN:  I discussed the risks and benefits of pacemaker implant with her  at length.  She will be  scheduled for pacemaker implant early next week.  She will have routine blood work and chest x-ray done today.  She is to  call the office should she have any further problems.      Elmore Guise., M.D.  Electronically Signed     TWK/MEDQ  D:  06/15/2006  T:  06/17/2006  Job:  [id number]

## 2010-08-20 NOTE — Discharge Summary (Signed)
NAMECOLLIN, HENDLEY NO.:  [id number]   MEDICAL RECORD NO.:  [id number]          PATIENT TYPE:  OIB   LOCATION:  3742                         FACILITY:  [facility]   PHYSICIAN:  Elmore Guise., M.D.DATE OF BIRTH:  Sep 19, 1926   DATE OF ADMISSION:  06/20/2006  DATE OF DISCHARGE:  06/21/2006                               DISCHARGE SUMMARY   DISCHARGE DIAGNOSES:  1. History of presyncope with intermittent complete heart block.  2. Hypertension.  3. Dyslipidemia.   HISTORY OF PRESENT ILLNESS:  Ms. Bayne is a very pleasant 77-year-  old white female who presented with presyncope. She was found to have  intermittent third-degree heart block. She was referred for pacemaker  implant.   HOSPITAL COURSE:  The patient's hospital course was uncomplicated. She  underwent dual chamber permanent pacemaker implant on June 20, 2006.  She tolerated procedure well. Her postprocedure chest x-ray showed  appropriate placement of her atrial and ventricular leads. Her pacemaker  was interrogated and functioned appropriately. Her pacemaker site showed  no swelling or erythema.  There was mild ecchymosis noted in and around  her site; however, no active areas. She did have some skin tears from  removal of her adhesive; otherwise, was doing very well.   She will be discharged home today to continue her prior medications.  These include Vytorin 10/10 mg daily, Actonel 35 mg weekly, multivitamin  once daily, Caltrate once daily, aspirin 81 mg daily, Altace 5 mg daily,  Tylenol Extra Strength 1-2 every six hours as needed for pain.   She is also to betadine her Steri-Strips twice daily for the next three  days. Post pacemaker instructions and restriction sheet was given to the  patient. The patient has been up and ambulatory with no significant  problems. I did discuss her restrictions with her at length. She is to  slowly increase her activity. However, she is to limit any lifting  or  strenuous activity with her left arm.   FOLLOWUP:  She will follow up with Dr. Lady Deutscher in one week.      Elmore Guise., M.D.  Electronically Signed     TWK/MEDQ  D:  06/21/2006  T:  06/21/2006  Job:  [id number]

## 2010-08-20 NOTE — Op Note (Signed)
NAMEDAJIAH, KOOI NO.:  [id number]   MEDICAL RECORD NO.:  [id number]          PATIENT TYPE:  OIB   LOCATION:  2899                         FACILITY:  [facility]   PHYSICIAN:  Elmore Guise., M.D.DATE OF BIRTH:  Apr 22, 1926   DATE OF PROCEDURE:  06/20/2006  DATE OF DISCHARGE:                               OPERATIVE REPORT   PROCEDURE:  Dual-chamber permanent pacemaker implant.   INDICATIONS FOR PROCEDURE:  Intermittent third-degree heart block with  presyncope.   HISTORY OF PRESENT ILLNESS:  Ms. Peggy Mendez is a very pleasant, 77-year-  old, white female whose had episodes of syncope.  She was found to have  intermittent AV dissociation and third-degree heart block.  She is now  referred for a permanent pacemaker implant.   DESCRIPTION OF PROCEDURE:  The patient was brought to the cardiac cath  lab after appropriate informed consent.  She was prepped and draped in a  sterile fashion.  Approximately 20 mL of 1% lidocaine was used for local  anesthesia.  A 2-inch incision was made in the left deltopectoral  groove.  A subcutaneous pocket was then made with blunt and Bovie  dissection.  A venogram was then performed showing the course of the  left axillary/subclavian vein.  The left axillary vein was then accessed  in two separate sticks under fluoroscopic guidance. Two 7-French safety  sheaths were placed over the retained wires.  The leads were then  placed. The ventricular lead is a Medtronic 5076 - 52 cm, serial #  [id number] active fixation lead.  The following measurements were  obtained.  R waves measured 10.4 millivolts with a threshold of 1.0  volts at 0.5 milliseconds, impedance of 760 ohms, slew rate 1.1 and  current of 1.6 mA.  A 10-volt check was negative.  The ventricular lead  was then sewn into place.  The atrial lead was then placed. The atrial  lead is a Medtronic 5076 - 45 cm, serial #[id number]. The following  measurements were obtained.  P  waves measured 4.1 mV with a threshold of  0.7 volts at 0.5 msec, impedance of 807 ohms, slew rate of 2.1 and  current of 1.1 mA.  A 10-volt checked was again negative.  The peel-away  sheaths were removed prior to sewing the atrial and ventricular leads  down into the pocket. The pocket then was irrigated with Kanamycin  solution.  Hemostasis was obtained.  The atrial and ventricular leads  were then identified and placed in the appropriate portion and a  Medtronic Adapta ADDR01, serial [id number] generator.  The generator  was sewn into the pocket.  The pocket was then closed in three  continuous layers with 2-0 followed by 2-0 followed by 4-0 Vicryl  suture.  The patient tolerated the procedure well.  No apparent  complications.      Elmore Guise., M.D.  Electronically Signed     TWK/MEDQ  D:  06/20/2006  T:  06/20/2006  Job:  [id number]   cc:   Peter M. [place], M.D.

## 2010-08-23 ENCOUNTER — Encounter: Payer: Self-pay | Admitting: *Deleted

## 2010-08-25 NOTE — Progress Notes (Signed)
Pacer remote check  

## 2010-09-22 ENCOUNTER — Other Ambulatory Visit: Payer: Self-pay | Admitting: Internal Medicine

## 2010-09-22 MED ORDER — SIMVASTATIN 20 MG PO TABS: 20.0000 mg | ORAL_TABLET | Freq: Every day | ORAL | Status: DC

## 2010-09-22 NOTE — Telephone Encounter (Signed)
Placed rx upfront for pt to pick up.

## 2010-11-17 ENCOUNTER — Other Ambulatory Visit: Payer: Self-pay

## 2010-11-18 ENCOUNTER — Ambulatory Visit (INDEPENDENT_AMBULATORY_CARE_PROVIDER_SITE_OTHER): Payer: Medicare Other | Admitting: *Deleted

## 2010-11-18 DIAGNOSIS — Z950 Presence of cardiac pacemaker: Secondary | ICD-10-CM

## 2010-11-18 DIAGNOSIS — I4891 Unspecified atrial fibrillation: Secondary | ICD-10-CM

## 2010-11-19 LAB — REMOTE PACEMAKER DEVICE
AL AMPLITUDE: 2.8 mv
BAMS-0001: 175 {beats}/min
BATTERY VOLTAGE: 2.77 V
VENTRICULAR PACING PM: 0

## 2010-11-26 ENCOUNTER — Encounter: Payer: Self-pay | Admitting: *Deleted

## 2010-11-26 NOTE — Progress Notes (Signed)
Pacer remote check  

## 2011-01-04 ENCOUNTER — Telehealth: Payer: Self-pay | Admitting: Cardiology

## 2011-01-04 ENCOUNTER — Other Ambulatory Visit: Payer: Self-pay | Admitting: Internal Medicine

## 2011-01-04 MED ORDER — RAMIPRIL 5 MG PO CAPS: 5.0000 mg | ORAL_CAPSULE | Freq: Every day | ORAL | Status: DC

## 2011-01-04 NOTE — Telephone Encounter (Signed)
appointment

## 2011-01-04 NOTE — Telephone Encounter (Signed)
Last time she saw dr [place] was march 2011

## 2011-01-04 NOTE — Telephone Encounter (Signed)
Pt calling re a question she has

## 2011-01-04 NOTE — Telephone Encounter (Signed)
Rx Done . 

## 2011-01-04 NOTE — Telephone Encounter (Signed)
Refill ramipril 5mg  once a day - 90 day supply - x'3 refills - patient will pickup wed [phone]

## 2011-01-05 ENCOUNTER — Telehealth: Payer: Self-pay | Admitting: *Deleted

## 2011-01-05 NOTE — Telephone Encounter (Signed)
Called to schedule an app. Last seen 3/11. Will see  10/18 as 1 yr

## 2011-01-06 ENCOUNTER — Encounter: Payer: Self-pay | Admitting: Cardiology

## 2011-01-20 ENCOUNTER — Ambulatory Visit (INDEPENDENT_AMBULATORY_CARE_PROVIDER_SITE_OTHER): Payer: Medicare Other | Admitting: Cardiology

## 2011-01-20 ENCOUNTER — Encounter: Payer: Self-pay | Admitting: Cardiology

## 2011-01-20 VITALS — BP 150/83 | HR 80 | Ht 66.0 in | Wt 126.8 lb

## 2011-01-20 DIAGNOSIS — E7800 Pure hypercholesterolemia, unspecified: Secondary | ICD-10-CM

## 2011-01-20 DIAGNOSIS — I10 Essential (primary) hypertension: Secondary | ICD-10-CM

## 2011-01-20 DIAGNOSIS — I441 Atrioventricular block, second degree: Secondary | ICD-10-CM

## 2011-01-20 NOTE — Patient Instructions (Signed)
Monitor your blood pressure at home. Let us know if it is staying high.  I will see you again in 1 year.  We will keep an eye on your rhythm, if it appears you are having more atrial fibrillation we may need to put you on a blood thinner.

## 2011-01-21 DIAGNOSIS — I441 Atrioventricular block, second degree: Secondary | ICD-10-CM | POA: Insufficient documentation

## 2011-01-21 NOTE — Progress Notes (Signed)
[name] Date of Birth: 1926-10-21 Medical Record #[id number]  History of Present Illness: Peggy Mendez is seen for yearly followup. She has a history of symptomatic AV block and is status post pacemaker implant. She reports that she has done very well this year. She has had no symptoms of palpitations, dizziness, chest pain, or shortness of breath. On her last pacemaker evaluation she was noted to have some episodes of mode switching that lasted less than 5 minutes. She does have a history of hypertension and hyperlipidemia.  Current Outpatient Prescriptions on File Prior to Visit  Medication Sig Dispense Refill  . aspirin 81 MG tablet Take 81 mg by mouth daily.        . calcium carbonate 200 MG capsule Take 250 mg by mouth 2 (two) times daily with a meal.        . metroNIDAZOLE (METROCREAM) 0.75 % cream Apply topically 2 (two) times daily.        . multivitamin (THERAGRAN) per tablet Take 1 tablet by mouth daily.        . ramipril (ALTACE) 5 MG capsule Take 1 capsule (5 mg total) by mouth daily.  90 capsule  1  . simvastatin (ZOCOR) 20 MG tablet Take 1 tablet (20 mg total) by mouth at bedtime.  90 tablet  1    No Known Allergies  Past Medical History  Diagnosis Date  . Hypertension   . Hyperlipidemia   . Pacemaker     permanent for mobitz II 2nd degree AV block (MDT)  . History of colonic polyps   . Osteopenia   . Paroxysmal atrial fibrillation   . Syncope 11/2005    ECHO ao sclerosis (observe per cards)  . Skin cancer 2010    Left, SCC    Past Surgical History  Procedure Date  . Ppm     For mobitz II second degree AV block 2008  . Hemorrhoid surgery   . Abdominal hysterectomy   . Oophorectomy     B  . Appendectomy   . Breast biopsy   . Insert / replace / remove pacemaker   . US echocardiography 12/08/2005    EF 55-60%    History  Smoking status  . Never Smoker   Smokeless tobacco  . Not on file    History  Alcohol Use  . Yes    socially     Family History  Problem Relation Age of Onset  . Colon cancer Neg Hx   . Diabetes Neg Hx   . Breast cancer Sister   . Heart attack Sister   . Hypertension Sister   . Coronary artery disease      M, F,other family members  . Heart attack Mother   . Heart failure Mother   . Heart attack Father   . Heart attack Brother     Review of Systems: As noted in history of present illness..  All other systems were reviewed and are negative.  Physical Exam: BP 150/83  Pulse 80  Ht 5\' 6"  (1.676 m)  Wt 126 lb 12.8 oz (57.516 kg)  BMI 20.47 kg/m2 The patient is alert and oriented x 3.  The mood and affect are normal.  The skin is warm and dry.  Color is normal.  The HEENT exam reveals that the sclera are nonicteric.  The mucous membranes are moist.  The carotids are 2+ without bruits.  There is no thyromegaly.  There is no JVD.  The lungs are  clear.  The chest wall is non tender.  The heart exam reveals a regular rate with a normal S1 and S2.  There are no murmurs, gallops, or rubs.  The PMI is not displaced.   Abdominal exam reveals good bowel sounds.  There is no guarding or rebound.  There is no hepatosplenomegaly or tenderness.  There are no masses.  Exam of the legs reveal no clubbing, cyanosis, or edema.  The legs are without rashes.  The distal pulses are intact.  Cranial nerves II - XII are intact.  Motor and sensory functions are intact.  The gait is normal.  LABORATORY DATA: ECG demonstrates normal sinus rhythm with occasional PVCs. It is otherwise normal.  Assessment / Plan:

## 2011-01-21 NOTE — Assessment & Plan Note (Signed)
She has a history of symptomatic AV block. She is asymptomatic with pacemaker in place. It is concerning that she has episodes of mode switching suggesting that she has paroxysmal atrial fibrillation. She has a [place] score of 2. If on her followup pacemaker evaluation she has had increased episodes I think this would argue for long-term anticoagulation. I did discuss this with her today. She is scheduled to see Dr. Johney Frame in December.

## 2011-01-27 ENCOUNTER — Ambulatory Visit (INDEPENDENT_AMBULATORY_CARE_PROVIDER_SITE_OTHER): Payer: Medicare Other | Admitting: Internal Medicine

## 2011-01-27 ENCOUNTER — Encounter: Payer: Self-pay | Admitting: Internal Medicine

## 2011-01-27 VITALS — BP 118/70 | HR 83 | Temp 97.9°F | Resp 14 | Wt 127.5 lb

## 2011-01-27 DIAGNOSIS — M949 Disorder of cartilage, unspecified: Secondary | ICD-10-CM

## 2011-01-27 DIAGNOSIS — E785 Hyperlipidemia, unspecified: Secondary | ICD-10-CM

## 2011-01-27 DIAGNOSIS — I441 Atrioventricular block, second degree: Secondary | ICD-10-CM

## 2011-01-27 DIAGNOSIS — M899 Disorder of bone, unspecified: Secondary | ICD-10-CM

## 2011-01-27 DIAGNOSIS — Z23 Encounter for immunization: Secondary | ICD-10-CM

## 2011-01-27 DIAGNOSIS — I10 Essential (primary) hypertension: Secondary | ICD-10-CM

## 2011-01-27 NOTE — Assessment & Plan Note (Signed)
Well-controlled, no change 

## 2011-01-27 NOTE — Progress Notes (Signed)
  Subjective:    Patient ID: Peggy Mendez, female    DOB: 1927/01/12, 77 y.o.   MRN: [id number]  HPI Routine office visit, doing well Chart reviewed, saw  cardiology few days ago, she was asymptomatic.   Past Medical History  Diagnosis Date  . Hypertension   . Hyperlipidemia   . Pacemaker     permanent for mobitz II 2nd degree AV block (MDT)  . History of colonic polyps   . Osteopenia   . Paroxysmal atrial fibrillation   . Syncope 11/2005    ECHO ao sclerosis (observe per cards)  . Skin cancer 2010    Left, SCC   Past Surgical History  Procedure Date  . Ppm     For mobitz II second degree AV block 2008  . Hemorrhoid surgery   . Abdominal hysterectomy   . Oophorectomy     B  . Appendectomy   . Breast biopsy   . Insert / replace / remove pacemaker   . US echocardiography 12/08/2005    EF 55-60%   Review of Systems Good medication compliance. diet very healthy 95% of the time.  She remains active, walks outdoors an hour daily or bikes indoors instead  depending on the weather. Denies any chest pain or shortness of breath Ambulatory blood pressures within normal.    Objective:   Physical Exam  Constitutional: She is oriented to person, place, and time. She appears well-developed and well-nourished.  Cardiovascular: Normal rate, regular rhythm and normal heart sounds.   Pulmonary/Chest: Effort normal and breath sounds normal. No respiratory distress. She has no wheezes. She has no rales.  Musculoskeletal: She exhibits no edema.  Neurological: She is alert and oriented to person, place, and time.  Psychiatric: She has a normal mood and affect. Her behavior is normal. Judgment and thought content normal.          Assessment & Plan:

## 2011-01-27 NOTE — Assessment & Plan Note (Signed)
Stable, followed by cardiology

## 2011-01-27 NOTE — Assessment & Plan Note (Addendum)
  DEA 08-2006 and 10-2008 -------> osteopenia @ hip Plan: Due for a bone density test, we'll schedule

## 2011-01-27 NOTE — Assessment & Plan Note (Addendum)
We reviewed with the patient all her previous panels, HDL is excellent, LDL slightly > 100. Plan: Continue with present care and  recheck on RTC

## 2011-02-28 ENCOUNTER — Other Ambulatory Visit: Payer: Self-pay | Admitting: Internal Medicine

## 2011-02-28 DIAGNOSIS — M8580 Other specified disorders of bone density and structure, unspecified site: Secondary | ICD-10-CM

## 2011-03-15 ENCOUNTER — Ambulatory Visit (INDEPENDENT_AMBULATORY_CARE_PROVIDER_SITE_OTHER)
Admission: RE | Admit: 2011-03-15 | Discharge: 2011-03-15 | Disposition: A | Discharge status: Home / Self Care | Payer: Medicare Other | Source: Ambulatory Visit

## 2011-03-15 DIAGNOSIS — M8580 Other specified disorders of bone density and structure, unspecified site: Secondary | ICD-10-CM

## 2011-03-15 DIAGNOSIS — M899 Disorder of bone, unspecified: Secondary | ICD-10-CM

## 2011-04-06 ENCOUNTER — Other Ambulatory Visit: Payer: Self-pay | Admitting: Internal Medicine

## 2011-04-06 DIAGNOSIS — Z1231 Encounter for screening mammogram for malignant neoplasm of breast: Secondary | ICD-10-CM

## 2011-04-14 ENCOUNTER — Telehealth: Payer: Self-pay | Admitting: Internal Medicine

## 2011-04-14 MED ORDER — SIMVASTATIN 20 MG PO TABS: 20.0000 mg | ORAL_TABLET | Freq: Every day | ORAL | Status: DC

## 2011-04-14 NOTE — Telephone Encounter (Signed)
Patient would like a 90 day refill 20mg  of simvastatin sent to [facility] on [address]

## 2011-04-18 ENCOUNTER — Ambulatory Visit (INDEPENDENT_AMBULATORY_CARE_PROVIDER_SITE_OTHER): Payer: Medicare Other | Admitting: Internal Medicine

## 2011-04-18 ENCOUNTER — Encounter: Payer: Self-pay | Admitting: Internal Medicine

## 2011-04-18 DIAGNOSIS — I10 Essential (primary) hypertension: Secondary | ICD-10-CM | POA: Diagnosis not present

## 2011-04-18 DIAGNOSIS — Z950 Presence of cardiac pacemaker: Secondary | ICD-10-CM

## 2011-04-18 DIAGNOSIS — I441 Atrioventricular block, second degree: Secondary | ICD-10-CM

## 2011-04-18 DIAGNOSIS — I4891 Unspecified atrial fibrillation: Secondary | ICD-10-CM | POA: Diagnosis not present

## 2011-04-18 LAB — PACEMAKER DEVICE OBSERVATION
AL AMPLITUDE: 5.6 mv
ATRIAL PACING PM: 12
BAMS-0001: 175 {beats}/min
BATTERY VOLTAGE: 2.77 V
RV LEAD IMPEDENCE PM: 532 Ohm
RV LEAD THRESHOLD: 0.75 V
VENTRICULAR PACING PM: 0

## 2011-04-18 NOTE — Assessment & Plan Note (Signed)
Elevated today, though she reports good bp control at home No changes 2 gram sodium restriction advised

## 2011-04-18 NOTE — Assessment & Plan Note (Signed)
Though her chadsvasc score is 3, she has had only 1 episode of afib lasting <6 minutes since last interrogation 1/12.  She would therefore prefer to continue ASA, but will consider coumadin/ pradaxa/ xarelto if her afib burden increases.

## 2011-04-18 NOTE — Patient Instructions (Signed)
Your physician wants you to follow-up in: 12 months with Dr Allred You will receive a reminder letter in the mail two months in advance. If you don't receive a letter, please call our office to schedule the follow-up appointment.   Remote monitoring is used to monitor your Pacemaker of ICD from home. This monitoring reduces the number of office visits required to check your device to one time per year. It allows us to keep an eye on the functioning of your device to ensure it is working properly. You are scheduled for a device check from home on 07/21/11. You may send your transmission at any time that day. If you have a wireless device, the transmission will be sent automatically. After your physician reviews your transmission, you will receive a postcard with your next transmission date.   

## 2011-04-18 NOTE — Assessment & Plan Note (Signed)
Normal pacemaker function See Pace Art report No changes today  

## 2011-04-18 NOTE — Progress Notes (Signed)
PCP:  Willow Ora, MD, MD  The patient presents today for routine electrophysiology followup.  Since last being seen in our clinic, the patient reports doing very well.  Today, she denies symptoms of palpitations, chest pain, shortness of breath, dizziness, presyncope, or syncope.  The patient feels that she is tolerating medications without difficulties and is otherwise without complaint today.   Past Medical History  Diagnosis Date  . Hypertension   . Hyperlipidemia   . Pacemaker     permanent for mobitz II 2nd degree AV block (MDT)  . History of colonic polyps   . Osteopenia   . Paroxysmal atrial fibrillation   . Syncope 11/2005    ECHO ao sclerosis (observe per cards)  . Skin cancer 2010    Left, SCC   Past Surgical History  Procedure Date  . Ppm     For mobitz II second degree AV block 2008  . Hemorrhoid surgery   . Abdominal hysterectomy   . Oophorectomy     B  . Appendectomy   . Breast biopsy   . Insert / replace / remove pacemaker   . US echocardiography 12/08/2005    EF 55-60%    Current Outpatient Prescriptions  Medication Sig Dispense Refill  . aspirin 81 MG tablet Take 81 mg by mouth daily.        . calcium citrate-vitamin D 200-200 MG-UNIT TABS Take 1 tablet by mouth daily. Pt takes 300 mg calcium daily.       . metroNIDAZOLE (METROCREAM) 0.75 % cream Apply topically 2 (two) times daily as needed.       . multivitamin (THERAGRAN) per tablet Take 1 tablet by mouth daily.        . Omega-3 Fatty Acids (FISH OIL) 1200 MG CAPS Take by mouth. Take one tablet daily       . ramipril (ALTACE) 5 MG capsule Take 1 capsule (5 mg total) by mouth daily.  90 capsule  1  . simvastatin (ZOCOR) 20 MG tablet Take 1 tablet (20 mg total) by mouth at bedtime.  90 tablet  0    No Known Allergies  History   Social History  . Marital Status: Single    Spouse Name: N/A    Number of Children: 0  . Years of Education: N/A   Occupational History  . retired     Social History Main  Topics  . Smoking status: Former Games developer  . Smokeless tobacco: Not on file   Comment: >20 yrs ago  . Alcohol Use: Yes     socially  . Drug Use: No  . Sexually Active: Not on file   Other Topics Concern  . Not on file   Social History Narrative   Lives by herself.... still drives...Marland KitchenADL independent.Marland KitchenMarland KitchenMarland KitchenDiet- very healthy    Family History  Problem Relation Age of Onset  . Colon cancer Neg Hx   . Diabetes Neg Hx   . Breast cancer Sister   . Heart attack Sister   . Hypertension Sister   . Coronary artery disease      M, F,other family members  . Heart attack Mother   . Heart failure Mother   . Heart attack Father   . Heart attack Brother    Physical Exam: Filed Vitals:   04/18/11 1115  BP: 151/78  Pulse: 84  Weight: 129 lb (58.514 kg)    GEN- The patient is well appearing, alert and oriented x 3 today.   Head- normocephalic, atraumatic Eyes-  Sclera clear, conjunctiva pink Ears- hearing intact Oropharynx- clear Neck- supple, no JVP Lymph- no cervical lymphadenopathy Lungs- Clear to ausculation bilaterally, normal work of breathing Chest- pacemaker pocket is well healed Heart- Regular rate and rhythm, no murmurs, rubs or gallops, PMI not laterally displaced GI- soft, NT, ND, + BS Extremities- no clubbing, cyanosis, or edema  Pacemaker interrogation- reviewed in detail today,  See PACEART report  Assessment and Plan:

## 2011-04-26 ENCOUNTER — Encounter: Payer: Self-pay | Admitting: Internal Medicine

## 2011-05-03 ENCOUNTER — Ambulatory Visit (HOSPITAL_COMMUNITY)
Admission: RE | Admit: 2011-05-03 | Discharge: 2011-05-03 | Disposition: A | Discharge status: Home / Self Care | Payer: Medicare Other | Source: Ambulatory Visit | Attending: Internal Medicine | Admitting: Internal Medicine

## 2011-05-03 DIAGNOSIS — Z1231 Encounter for screening mammogram for malignant neoplasm of breast: Secondary | ICD-10-CM | POA: Insufficient documentation

## 2011-06-29 ENCOUNTER — Encounter: Payer: Self-pay | Admitting: Internal Medicine

## 2011-07-08 ENCOUNTER — Other Ambulatory Visit: Payer: Self-pay | Admitting: Internal Medicine

## 2011-07-08 MED ORDER — SIMVASTATIN 20 MG PO TABS: 20.0000 mg | ORAL_TABLET | Freq: Every day | ORAL | Status: DC

## 2011-07-08 NOTE — Telephone Encounter (Signed)
Pt came into office to request that we fax rx for simvastatin 20 mg. Take 1 tablet at bedtime. Qty 90. Last fill 04-16-11. [facility] [address]

## 2011-07-08 NOTE — Telephone Encounter (Signed)
Refill done.  

## 2011-07-21 ENCOUNTER — Encounter: Payer: Medicare Other | Admitting: *Deleted

## 2011-07-27 ENCOUNTER — Encounter: Payer: Self-pay | Admitting: *Deleted

## 2011-08-02 ENCOUNTER — Encounter: Payer: Self-pay | Admitting: Internal Medicine

## 2011-08-03 ENCOUNTER — Ambulatory Visit (INDEPENDENT_AMBULATORY_CARE_PROVIDER_SITE_OTHER): Payer: Medicare Other | Admitting: *Deleted

## 2011-08-03 DIAGNOSIS — Z950 Presence of cardiac pacemaker: Secondary | ICD-10-CM

## 2011-08-03 DIAGNOSIS — I4891 Unspecified atrial fibrillation: Secondary | ICD-10-CM | POA: Diagnosis not present

## 2011-08-03 DIAGNOSIS — I441 Atrioventricular block, second degree: Secondary | ICD-10-CM

## 2011-08-03 LAB — REMOTE PACEMAKER DEVICE
AL IMPEDENCE PM: 446 Ohm
ATRIAL PACING PM: 8
BATTERY VOLTAGE: 2.77 V
RV LEAD IMPEDENCE PM: 544 Ohm
VENTRICULAR PACING PM: 0

## 2011-08-08 NOTE — Progress Notes (Signed)
Remote pacer check  

## 2011-08-15 ENCOUNTER — Encounter: Payer: Self-pay | Admitting: *Deleted

## 2011-08-24 ENCOUNTER — Ambulatory Visit (INDEPENDENT_AMBULATORY_CARE_PROVIDER_SITE_OTHER): Payer: Medicare Other | Admitting: Internal Medicine

## 2011-08-24 ENCOUNTER — Encounter: Payer: Self-pay | Admitting: Internal Medicine

## 2011-08-24 VITALS — BP 126/68 | HR 83 | Temp 97.9°F | Ht 65.25 in | Wt 126.0 lb

## 2011-08-24 DIAGNOSIS — L719 Rosacea, unspecified: Secondary | ICD-10-CM | POA: Insufficient documentation

## 2011-08-24 DIAGNOSIS — M949 Disorder of cartilage, unspecified: Secondary | ICD-10-CM

## 2011-08-24 DIAGNOSIS — E785 Hyperlipidemia, unspecified: Secondary | ICD-10-CM

## 2011-08-24 DIAGNOSIS — R1900 Intra-abdominal and pelvic swelling, mass and lump, unspecified site: Secondary | ICD-10-CM

## 2011-08-24 DIAGNOSIS — I10 Essential (primary) hypertension: Secondary | ICD-10-CM

## 2011-08-24 DIAGNOSIS — Z0000 Encounter for general adult medical examination without abnormal findings: Secondary | ICD-10-CM | POA: Diagnosis not present

## 2011-08-24 DIAGNOSIS — M899 Disorder of bone, unspecified: Secondary | ICD-10-CM | POA: Diagnosis not present

## 2011-08-24 LAB — LIPID PANEL
Cholesterol: 219 mg/dL — ABNORMAL HIGH (ref 0–200)
Total CHOL/HDL Ratio: 2

## 2011-08-24 LAB — CBC WITH DIFFERENTIAL/PLATELET
Basophils Absolute: 0.1 10*3/uL (ref 0.0–0.1)
Basophils Relative: 1 % (ref 0.0–3.0)
Eosinophils Absolute: 0.1 10*3/uL (ref 0.0–0.7)
HCT: 40.3 % (ref 36.0–46.0)
Hemoglobin: 13.3 g/dL (ref 12.0–15.0)
Lymphs Abs: 1.5 10*3/uL (ref 0.7–4.0)
MCHC: 32.9 g/dL (ref 30.0–36.0)
MCV: 86.8 fl (ref 78.0–100.0)
Monocytes Absolute: 0.5 10*3/uL (ref 0.1–1.0)
Neutro Abs: 4.4 10*3/uL (ref 1.4–7.7)
RBC: 4.64 Mil/uL (ref 3.87–5.11)
RDW: 13.6 % (ref 11.5–14.6)

## 2011-08-24 LAB — COMPREHENSIVE METABOLIC PANEL
Albumin: 4.1 g/dL (ref 3.5–5.2)
Alkaline Phosphatase: 64 U/L (ref 39–117)
CO2: 27 mEq/L (ref 19–32)
GFR: 76.1 mL/min (ref >60.00)
Glucose, Bld: 87 mg/dL (ref 70–99)
Potassium: 4.3 mEq/L (ref 3.5–5.1)
Sodium: 142 mEq/L (ref 135–145)
Total Protein: 7.2 g/dL (ref 6.0–8.3)

## 2011-08-24 NOTE — Assessment & Plan Note (Signed)
Good medication compliance, we'll check labs

## 2011-08-24 NOTE — Assessment & Plan Note (Addendum)
Td , next 2016 Had a shingles shot 2011  Pneumonia shot 2005 H/o hysterectomy for benign reason remotely, since then had other normal   PAPs, we agreed on no further screening H/o an abnormal MMG and breast Bx,multiple (-) MMGs , last 04-2011--->  breast exam neg today: cont w/ SBE Last Cscope 8-09, 2 polyps, next 5 years Diet, exercise discussed and encouraged  Pulsatile mass in the epigastric area, likely an enlarged aorta versus AAA ---> check a ultrasound.

## 2011-08-24 NOTE — Assessment & Plan Note (Signed)
Good medication compliance, good ambulatory BPs. No change, labs

## 2011-08-24 NOTE — Progress Notes (Signed)
Subjective:    Patient ID: Peggy Mendez, female    DOB: [birth year], 78 y.o.   MRN: [id number]  HPI  Here for Medicare AWV: 1. Risk factors based on Past M, S, F history: reviewed   2. Physical Activities:  Very active, almost every day   3. Depression/mood:  No problems noted or reported   4. Hearing:  No problems noted or reported   5. ADL's:  independent 6. Fall Risk: no recent falls, prevention discussed  7. Home Safety: does feelsafe at home   8. Height, weight, &visual acuity:  See VS, vision corrected w/ glasses , sees eye doctor routinely, has early cataracts  9. Counseling: provided counseling about future care, has a living will, POA, etc 10. Labs ordered based on risk factors: yes   11. Referral Coordination:if needed 12.  Care Plan, see a/p   13.   Cognitive Assessment: motor and cognition seem appropiate for age   In addition we discussed the following: High cholesterol, good medication compliance without apparent side effects Hypertension, takes Altace routinely, ambulatory blood pressures under excellent control.  Pacemaker, sees cardiology routinely. Rosacea, uses MetroCream as needed  Past Medical History  Diagnosis Date  . Hypertension   . Hyperlipidemia   . Pacemaker     permanent for mobitz II 2nd degree AV block (MDT)  . History of colonic polyps   . Osteopenia   . Paroxysmal atrial fibrillation   . Syncope 11/2005    ECHO ao sclerosis (observe per cards)  . Skin cancer 2010    Left leg, SCC, sees derm    Past Surgical History  Procedure Date  . Ppm     For mobitz II second degree AV block 2008  . Hemorrhoid surgery   . Abdominal hysterectomy   . Oophorectomy     B  . Appendectomy   . Breast biopsy   . Insert / replace / remove pacemaker   . US echocardiography 12/08/2005    EF 55-60%    Family History  Problem Relation Age of Onset  . Colon cancer Neg Hx   . Diabetes Neg Hx   . Breast cancer Sister   . Hypertension Sister   .  Coronary artery disease      F, brothers x 2other family members  . Heart failure Mother   . Prostate cancer Brother     History   Social History  . Marital Status: Single    Spouse Name: N/A    Number of Children: 0  . Years of Education: N/A   Occupational History  . retired     Social History Main Topics  . Smoking status: Former Smoker    Quit date: 08/23/1988  . Smokeless tobacco: Not on file   Comment: >20 yrs ago, used to smoke ~ 1 ppd  . Alcohol Use: Yes     socially  . Drug Use: No  . Sexually Active: Not on file   Other Topics Concern  . Not on file   Social History Narrative   Lives by herself,  still drives, ADL independent, Diet- very healthy. Has a twin sister in [place], a nephew in [place]    Review of Systems No chest pain or shortness or breath Note nausea, vomiting, diarrhea. No blood in the stools.     Objective:   Physical Exam General -- alert, well-developed, and well-nourished.   Neck --no thyromegaly Breasts-- No mass, nodules, thickening, tenderness, bulging, retraction, inflamation, nipple discharge or skin  changes noted.  no axillary lymph nodes Lungs -- normal respiratory effort, no intercostal retractions, no accessory muscle use, and normal breath sounds.   Heart-- normal rate, regular rhythm, no murmur, and no gallop.   Abdomen--soft, non-tender, no distention, no masses;   Pulsatile mass @  the upper abdomen, nontender, no bruit Extremities-- no pretibial edema bilaterally Neurologic-- alert & oriented X3 and strength normal in all extremities. Psych-- Cognition and judgment appear intact. Alert and cooperative with normal attention span and concentration.  not anxious appearing and not depressed appearing.        Assessment & Plan:

## 2011-08-24 NOTE — Assessment & Plan Note (Addendum)
DEA 08-2006 and 10-2008 -----T score at the spine normal. DEXA 02-2011 T score of the hips was -2.0, -2.1, mild osteopenia. Previous Vit d was normal Currently on calcium, vitamin D. She is very active. She reports height loss.(2008 height was 66 inches, today 65.2 inches) Option of biphosphonate is discussed , she declined at this time, benefits discussed

## 2011-08-25 ENCOUNTER — Encounter: Payer: Self-pay | Admitting: Internal Medicine

## 2011-08-26 MED ORDER — ATORVASTATIN CALCIUM 20 MG PO TABS: 20.0000 mg | ORAL_TABLET | Freq: Every day | ORAL | Status: DC

## 2011-08-26 NOTE — Progress Notes (Signed)
Addended by: Edwena Felty T on: 08/26/2011 04:09 PM   Modules accepted: Orders

## 2011-09-07 ENCOUNTER — Other Ambulatory Visit: Payer: Self-pay | Admitting: Cardiology

## 2011-09-07 DIAGNOSIS — I700 Atherosclerosis of aorta: Secondary | ICD-10-CM

## 2011-09-14 ENCOUNTER — Encounter (INDEPENDENT_AMBULATORY_CARE_PROVIDER_SITE_OTHER): Payer: Medicare Other

## 2011-09-14 DIAGNOSIS — I700 Atherosclerosis of aorta: Secondary | ICD-10-CM

## 2011-09-14 DIAGNOSIS — I739 Peripheral vascular disease, unspecified: Secondary | ICD-10-CM

## 2011-10-31 ENCOUNTER — Other Ambulatory Visit (INDEPENDENT_AMBULATORY_CARE_PROVIDER_SITE_OTHER): Payer: Medicare Other

## 2011-10-31 DIAGNOSIS — E785 Hyperlipidemia, unspecified: Secondary | ICD-10-CM

## 2011-10-31 LAB — LIPID PANEL
LDL Cholesterol: 83 mg/dL (ref 0–99)
Total CHOL/HDL Ratio: 2

## 2011-10-31 LAB — AST: AST: 26 U/L (ref 0–37)

## 2011-11-01 ENCOUNTER — Encounter: Payer: Self-pay | Admitting: *Deleted

## 2011-11-10 ENCOUNTER — Encounter: Payer: Self-pay | Admitting: *Deleted

## 2011-11-10 ENCOUNTER — Encounter: Payer: Self-pay | Admitting: Internal Medicine

## 2011-11-10 ENCOUNTER — Ambulatory Visit (INDEPENDENT_AMBULATORY_CARE_PROVIDER_SITE_OTHER): Payer: Medicare Other | Admitting: *Deleted

## 2011-11-10 DIAGNOSIS — I441 Atrioventricular block, second degree: Secondary | ICD-10-CM

## 2011-11-10 LAB — REMOTE PACEMAKER DEVICE
AL THRESHOLD: 0.375 V
ATRIAL PACING PM: 8
BAMS-0001: 175 {beats}/min
RV LEAD THRESHOLD: 0.5 V

## 2011-11-14 ENCOUNTER — Other Ambulatory Visit: Payer: Self-pay | Admitting: Internal Medicine

## 2011-11-14 NOTE — Telephone Encounter (Signed)
rx sent to pharmacy by e-script  

## 2011-11-22 ENCOUNTER — Encounter: Payer: Self-pay | Admitting: *Deleted

## 2012-01-04 DIAGNOSIS — H2510 Age-related nuclear cataract, unspecified eye: Secondary | ICD-10-CM | POA: Diagnosis not present

## 2012-01-04 DIAGNOSIS — H524 Presbyopia: Secondary | ICD-10-CM | POA: Diagnosis not present

## 2012-01-04 DIAGNOSIS — G43809 Other migraine, not intractable, without status migrainosus: Secondary | ICD-10-CM | POA: Diagnosis not present

## 2012-01-04 DIAGNOSIS — H538 Other visual disturbances: Secondary | ICD-10-CM | POA: Diagnosis not present

## 2012-01-04 DIAGNOSIS — H53149 Visual discomfort, unspecified: Secondary | ICD-10-CM | POA: Diagnosis not present

## 2012-01-20 ENCOUNTER — Ambulatory Visit (INDEPENDENT_AMBULATORY_CARE_PROVIDER_SITE_OTHER): Payer: Medicare Other | Admitting: Cardiology

## 2012-01-20 ENCOUNTER — Encounter: Payer: Self-pay | Admitting: Cardiology

## 2012-01-20 VITALS — BP 138/66 | HR 79 | Ht 62.5 in | Wt 127.0 lb

## 2012-01-20 DIAGNOSIS — Z950 Presence of cardiac pacemaker: Secondary | ICD-10-CM

## 2012-01-20 DIAGNOSIS — I4891 Unspecified atrial fibrillation: Secondary | ICD-10-CM | POA: Diagnosis not present

## 2012-01-20 DIAGNOSIS — I10 Essential (primary) hypertension: Secondary | ICD-10-CM

## 2012-01-20 DIAGNOSIS — E785 Hyperlipidemia, unspecified: Secondary | ICD-10-CM | POA: Diagnosis not present

## 2012-01-20 MED ORDER — APIXABAN 2.5 MG PO TABS: 2.5000 mg | ORAL_TABLET | Freq: Two times a day (BID) | ORAL | Status: DC

## 2012-01-20 NOTE — Progress Notes (Signed)
D Peggy Mendez Date of Birth: 09-Mar-1927 Medical Record #[id number]  History of Present Illness: Peggy Mendez is seen for yearly followup. She has a history of symptomatic AV block and is status post pacemaker implant. On pacemaker followup she has been found to have paroxysmal atrial fibrillation. These have been asymptomatic. Her last pacemaker check in August showed 35 mode switch episodes the longest lasting 3 hours and 49 minutes. She denies any chest pain. She has no shortness of breath.  Current Outpatient Prescriptions on File Prior to Visit  Medication Sig Dispense Refill  . aspirin 81 MG tablet Take 81 mg by mouth daily.        Marland Kitchen atorvastatin (LIPITOR) 20 MG tablet TAKE 1 TABLET BY MOUTH DAILY  90 tablet  0  . calcium citrate-vitamin D 200-200 MG-UNIT TABS Take 1 tablet by mouth daily. Pt takes 300 mg calcium daily.       . metroNIDAZOLE (METROCREAM) 0.75 % cream Apply topically 2 (two) times daily as needed.       . multivitamin (THERAGRAN) per tablet Take 1 tablet by mouth daily.        . Omega-3 Fatty Acids (FISH OIL) 1200 MG CAPS Take by mouth. Take one tablet daily       . ramipril (ALTACE) 5 MG capsule TAKE ONE CAPSULE EVERY DAY  90 capsule  3  . apixaban (ELIQUIS) 2.5 MG TABS tablet Take 1 tablet (2.5 mg total) by mouth 2 (two) times daily.  60 tablet  6    No Known Allergies  Past Medical History  Diagnosis Date  . Hypertension   . Hyperlipidemia   . Pacemaker     permanent for mobitz II 2nd degree AV block (MDT)  . History of colonic polyps   . Osteopenia   . Paroxysmal atrial fibrillation   . Syncope 11/2005    ECHO ao sclerosis (observe per cards)  . Skin cancer 2010    Left leg, SCC, sees derm    Past Surgical History  Procedure Date  . Ppm     For mobitz II second degree AV block 2008  . Hemorrhoid surgery   . Abdominal hysterectomy   . Oophorectomy     B  . Appendectomy   . Breast biopsy   . Insert / replace / remove pacemaker   . US  echocardiography 12/08/2005    EF 55-60%    History  Smoking status  . Former Smoker  . Quit date: 08/23/1988  Smokeless tobacco  . Not on file  Comment: >20 yrs ago, used to smoke ~ 1 ppd    History  Alcohol Use  . Yes    socially    Family History  Problem Relation Age of Onset  . Colon cancer Neg Hx   . Diabetes Neg Hx   . Breast cancer Sister   . Hypertension Sister   . Coronary artery disease      F, brothers x 2other family members  . Heart failure Mother   . Prostate cancer Brother     Review of Systems: As noted in history of present illness.  All other systems were reviewed and are negative.  Physical Exam: BP 138/66  Pulse 79  Ht 5' 2.5" (1.588 m)  Wt 57.607 kg (127 lb)  BMI 22.86 kg/m2  SpO2 97% She is very pleasant and in no distress. The skin is warm and dry.  Color is normal.  The HEENT exam reveals that the sclera are nonicteric.  The mucous membranes are moist.  The carotids are 2+ without bruits.  There is no thyromegaly.  There is no JVD.  The lungs are clear.  The chest wall is non tender.  The heart exam reveals a regular rate with a normal S1 and S2.  There are no murmurs, gallops, or rubs.  The PMI is not displaced.     There is no hepatosplenomegaly or tenderness.  There are no masses.  Exam of the legs reveal no clubbing, cyanosis, or edema.  The legs are without rashes.  The distal pulses are intact.  Cranial nerves II - XII are intact.  Motor and sensory functions are intact.  The gait is normal.  LABORATORY DATA: ECG demonstrates normal sinus rhythm with a normal ECG.  Assessment / Plan: 1. Paroxysmal atrial fibrillation. Patient is asymptomatic. Since she has AV block her rate does not change with atrial fibrillation and she is asymptomatic. Based on her risk score she has an annual risk of stroke of about 8%. She really has no significant bleeding risk. I recommended she go on anticoagulation now. We will start her on Apixaban 2.5 mg twice a  day. I instructed her to hold her aspirin. We will followup again in 3 months and check a CBC and basic metabolic panel at that time.  2. Mobitz type 2 second degree AV block status post pacemaker implant.  3. Hypertension, controlled.  4. Hyperlipidemia.

## 2012-01-20 NOTE — Patient Instructions (Addendum)
We will start Eliquis 2.5 mg twice a day for blood thinner.  Stop ASA  I will see you in 3 months with lab work.

## 2012-02-13 ENCOUNTER — Ambulatory Visit (INDEPENDENT_AMBULATORY_CARE_PROVIDER_SITE_OTHER): Payer: Medicare Other | Admitting: *Deleted

## 2012-02-13 ENCOUNTER — Encounter: Payer: Self-pay | Admitting: Internal Medicine

## 2012-02-13 DIAGNOSIS — Z950 Presence of cardiac pacemaker: Secondary | ICD-10-CM

## 2012-02-13 DIAGNOSIS — I441 Atrioventricular block, second degree: Secondary | ICD-10-CM

## 2012-02-16 LAB — REMOTE PACEMAKER DEVICE
AL AMPLITUDE: 2.8 mv
AL IMPEDENCE PM: 446 Ohm
BATTERY VOLTAGE: 2.76 V
RV LEAD AMPLITUDE: 22.4 mv
RV LEAD IMPEDENCE PM: 513 Ohm

## 2012-02-20 ENCOUNTER — Other Ambulatory Visit: Payer: Self-pay | Admitting: Internal Medicine

## 2012-02-20 NOTE — Telephone Encounter (Signed)
Refill done.  

## 2012-02-28 ENCOUNTER — Encounter: Payer: Self-pay | Admitting: *Deleted

## 2012-03-07 DIAGNOSIS — H2510 Age-related nuclear cataract, unspecified eye: Secondary | ICD-10-CM | POA: Diagnosis not present

## 2012-03-14 DIAGNOSIS — L259 Unspecified contact dermatitis, unspecified cause: Secondary | ICD-10-CM | POA: Diagnosis not present

## 2012-03-14 DIAGNOSIS — L708 Other acne: Secondary | ICD-10-CM | POA: Diagnosis not present

## 2012-03-14 DIAGNOSIS — D485 Neoplasm of uncertain behavior of skin: Secondary | ICD-10-CM | POA: Diagnosis not present

## 2012-03-14 DIAGNOSIS — L570 Actinic keratosis: Secondary | ICD-10-CM | POA: Diagnosis not present

## 2012-03-20 DIAGNOSIS — H269 Unspecified cataract: Secondary | ICD-10-CM | POA: Diagnosis not present

## 2012-03-20 DIAGNOSIS — H2510 Age-related nuclear cataract, unspecified eye: Secondary | ICD-10-CM | POA: Diagnosis not present

## 2012-04-02 ENCOUNTER — Other Ambulatory Visit: Payer: Self-pay | Admitting: Internal Medicine

## 2012-04-02 DIAGNOSIS — Z1231 Encounter for screening mammogram for malignant neoplasm of breast: Secondary | ICD-10-CM

## 2012-04-16 DIAGNOSIS — H2510 Age-related nuclear cataract, unspecified eye: Secondary | ICD-10-CM | POA: Diagnosis not present

## 2012-04-17 ENCOUNTER — Other Ambulatory Visit (INDEPENDENT_AMBULATORY_CARE_PROVIDER_SITE_OTHER): Payer: Medicare Other

## 2012-04-17 ENCOUNTER — Encounter: Payer: Self-pay | Admitting: Cardiology

## 2012-04-17 ENCOUNTER — Ambulatory Visit (INDEPENDENT_AMBULATORY_CARE_PROVIDER_SITE_OTHER): Payer: Medicare Other | Admitting: Cardiology

## 2012-04-17 VITALS — BP 138/70 | HR 94 | Resp 16 | Ht 66.0 in | Wt 125.1 lb

## 2012-04-17 DIAGNOSIS — E785 Hyperlipidemia, unspecified: Secondary | ICD-10-CM | POA: Diagnosis not present

## 2012-04-17 DIAGNOSIS — I4891 Unspecified atrial fibrillation: Secondary | ICD-10-CM

## 2012-04-17 DIAGNOSIS — I441 Atrioventricular block, second degree: Secondary | ICD-10-CM

## 2012-04-17 DIAGNOSIS — I10 Essential (primary) hypertension: Secondary | ICD-10-CM | POA: Diagnosis not present

## 2012-04-17 DIAGNOSIS — R0989 Other specified symptoms and signs involving the circulatory and respiratory systems: Secondary | ICD-10-CM

## 2012-04-17 LAB — BASIC METABOLIC PANEL
CO2: 29 mEq/L (ref 19–32)
Glucose, Bld: 97 mg/dL (ref 70–99)
Potassium: 4 mEq/L (ref 3.5–5.1)
Sodium: 138 mEq/L (ref 135–145)

## 2012-04-17 MED ORDER — APIXABAN 2.5 MG PO TABS: 2.5000 mg | ORAL_TABLET | Freq: Two times a day (BID) | ORAL | Status: DC

## 2012-04-17 NOTE — Progress Notes (Signed)
[name] Date of Birth: 1926/06/09 Medical Record #[id number]  History of Present Illness: Peggy Mendez is seen for followup. She has a history of symptomatic AV block and is status post pacemaker implant. On pacemaker followup she has been found to have paroxysmal atrial fibrillation. These episodes have been asymptomatic.  She denies any chest pain. She has no shortness of breath. She is on anticoagulation with Eliquis. She reports on October 20th that she awoke and felt like the was a cat sitting on her chest and she needed to swat it away. She isn't sure whether she was dreaming or if something was going on with her heart rhythm.  Current Outpatient Prescriptions on File Prior to Visit  Medication Sig Dispense Refill  . apixaban (ELIQUIS) 2.5 MG TABS tablet Take 1 tablet (2.5 mg total) by mouth 2 (two) times daily.  180 tablet  3  . atorvastatin (LIPITOR) 20 MG tablet TAKE 1 TABLET BY MOUTH DAILY  90 tablet  1  . calcium citrate-vitamin D 200-200 MG-UNIT TABS Take 1 tablet by mouth daily. Pt takes 300 mg calcium daily.       . multivitamin (THERAGRAN) per tablet Take 1 tablet by mouth daily.        . Omega-3 Fatty Acids (FISH OIL) 1200 MG CAPS Take by mouth. Take one tablet daily       . ramipril (ALTACE) 5 MG capsule TAKE ONE CAPSULE EVERY DAY  90 capsule  3    No Known Allergies  Past Medical History  Diagnosis Date  . Hypertension   . Hyperlipidemia   . Pacemaker     permanent for mobitz II 2nd degree AV block (MDT)  . History of colonic polyps   . Osteopenia   . Paroxysmal atrial fibrillation   . Syncope 11/2005    ECHO ao sclerosis (observe per cards)  . Skin cancer 2010    Left leg, SCC, sees derm    Past Surgical History  Procedure Date  . Ppm     For mobitz II second degree AV block 2008  . Hemorrhoid surgery   . Abdominal hysterectomy   . Oophorectomy     B  . Appendectomy   . Breast biopsy   . Insert / replace / remove pacemaker   . US  echocardiography 12/08/2005    EF 55-60%    History  Smoking status  . Former Smoker  . Quit date: 08/23/1988  Smokeless tobacco  . Not on file    Comment: >20 yrs ago, used to smoke ~ 1 ppd    History  Alcohol Use  . Yes    Comment: socially    Family History  Problem Relation Age of Onset  . Colon cancer Neg Hx   . Diabetes Neg Hx   . Breast cancer Sister   . Hypertension Sister   . Coronary artery disease      F, brothers x 2other family members  . Heart failure Mother   . Prostate cancer Brother     Review of Systems: As noted in history of present illness.  All other systems were reviewed and are negative.  Physical Exam: BP 138/70  Pulse 94  Resp 16  Ht 5\' 6"  (1.676 m)  Wt 125 lb 1.9 oz (56.754 kg)  BMI 20.19 kg/m2  SpO2 99% She is very pleasant and in no distress. The skin is warm and dry.   The HEENT exam is normal. The carotids are 2+ without bruits.  There is no thyromegaly.  There is no JVD.  The lungs are clear.  The chest wall is non tender.  The heart exam reveals a regular rate with a normal S1 and S2.  There are no murmurs, gallops, or rubs.  The PMI is not displaced.     There is no hepatosplenomegaly or tenderness.  There are no masses.  Exam of the legs reveal no clubbing, cyanosis, or edema.  The legs are without rashes.  The distal pulses are intact.  Cranial nerves II - XII are intact.  Motor and sensory functions are intact.  The gait is normal.  LABORATORY DATA: Lab Results  Component Value Date   WBC 6.6 08/24/2011   HGB 13.3 08/24/2011   HCT 40.3 08/24/2011   PLT 212.0 08/24/2011   GLUCOSE 97 04/17/2012   CHOL 195 10/31/2011   TRIG 64.0 10/31/2011   HDL 99.50 10/31/2011   LDLDIRECT 125.6 08/24/2011   LDLCALC 83 10/31/2011   ALT 19 10/31/2011   AST 26 10/31/2011   NA 138 04/17/2012   K 4.0 04/17/2012   CL 102 04/17/2012   CREATININE 0.7 04/17/2012   BUN 18 04/17/2012   CO2 29 04/17/2012   TSH 2.723 07/30/2010     Assessment / Plan: 1.  Paroxysmal atrial fibrillation. Patient is asymptomatic. Since she has AV block her rate does not change with atrial fibrillation and she is asymptomatic.  Continue on Apixaban 2.5 mg twice a day due to age. I instructed her to hold her aspirin. BMET today is normal.  2. Mobitz type 2 second degree AV block status post pacemaker implant. Keep follow up in pacemaker clinic.  3. Hypertension, controlled.  4. Hyperlipidemia.

## 2012-04-17 NOTE — Patient Instructions (Signed)
Continue your current therapy  I will see you again in 6 months.   

## 2012-04-20 ENCOUNTER — Ambulatory Visit (INDEPENDENT_AMBULATORY_CARE_PROVIDER_SITE_OTHER): Payer: Medicare Other | Admitting: Internal Medicine

## 2012-04-20 ENCOUNTER — Encounter: Payer: Self-pay | Admitting: Internal Medicine

## 2012-04-20 VITALS — BP 134/80 | HR 88 | Ht 66.0 in | Wt 126.4 lb

## 2012-04-20 DIAGNOSIS — I441 Atrioventricular block, second degree: Secondary | ICD-10-CM

## 2012-04-20 DIAGNOSIS — I4891 Unspecified atrial fibrillation: Secondary | ICD-10-CM

## 2012-04-20 LAB — PACEMAKER DEVICE OBSERVATION
ATRIAL PACING PM: 11
BAMS-0001: 175 {beats}/min
BATTERY VOLTAGE: 2.76 V
VENTRICULAR PACING PM: 0

## 2012-04-20 NOTE — Progress Notes (Signed)
PCP: Willow Ora, MD Primary Cardiologist:  Dr [place]  Peggy Mendez is a 79 y.o. female who presents today for routine electrophysiology followup.  Since last being seen in our clinic, the patient reports doing very well.  Today, she denies symptoms of palpitations, chest pain, shortness of breath,  lower extremity edema, dizziness, presyncope, or syncope.  The patient is otherwise without complaint today.   Past Medical History  Diagnosis Date  . Hypertension   . Hyperlipidemia   . Pacemaker     permanent for mobitz II 2nd degree AV block (MDT)  . History of colonic polyps   . Osteopenia   . Paroxysmal atrial fibrillation   . Syncope 11/2005    ECHO ao sclerosis (observe per cards)  . Skin cancer 2010    Left leg, SCC, sees derm   Past Surgical History  Procedure Date  . Ppm     For mobitz II second degree AV block 2008  . Hemorrhoid surgery   . Abdominal hysterectomy   . Oophorectomy     B  . Appendectomy   . Breast biopsy   . Insert / replace / remove pacemaker   . US echocardiography 12/08/2005    EF 55-60%    Current Outpatient Prescriptions  Medication Sig Dispense Refill  . apixaban (ELIQUIS) 2.5 MG TABS tablet Take 1 tablet (2.5 mg total) by mouth 2 (two) times daily.  180 tablet  3  . atorvastatin (LIPITOR) 20 MG tablet TAKE 1 TABLET BY MOUTH DAILY  90 tablet  1  . calcium citrate-vitamin Peggy 200-200 MG-UNIT TABS Take 1 tablet by mouth daily. Pt takes 300 mg calcium daily.       . multivitamin (THERAGRAN) per tablet Take 1 tablet by mouth daily.        . Omega-3 Fatty Acids (FISH OIL) 1200 MG CAPS Take by mouth. Take one tablet daily       . ramipril (ALTACE) 5 MG capsule TAKE ONE CAPSULE EVERY DAY  90 capsule  3    Physical Exam: Filed Vitals:   04/20/12 1142  BP: 134/80  Pulse: 88  Height: 5\' 6"  (1.676 m)  Weight: 126 lb 6.4 oz (57.335 kg)    GEN- The patient is well appearing, alert and oriented x 3 today.   Head- normocephalic, atraumatic Eyes-  Sclera  clear, conjunctiva pink Ears- hearing intact Oropharynx- clear Lungs- Clear to ausculation bilaterally, normal work of breathing Heart- Regular rate and rhythm, no murmurs, rubs or gallops, PMI not laterally displaced GI- soft, NT, ND, + BS Extremities- no clubbing, cyanosis, or edema   Assessment and Plan:   AV block, 2nd degre  Normal pacemaker function  See Pace Art report  No changes today   PAROXYSMAL ATRIAL FIBRILLATION  Controlled Appropriately anticoagulated with eliquis  Return to the device clinic in 1year

## 2012-04-20 NOTE — Patient Instructions (Addendum)
Your physician wants you to follow-up in: 12 months with Dr Allred You will receive a reminder letter in the mail two months in advance. If you don't receive a letter, please call our office to schedule the follow-up appointment.   Remote monitoring is used to monitor your Pacemaker of ICD from home. This monitoring reduces the number of office visits required to check your device to one time per year. It allows us to keep an eye on the functioning of your device to ensure it is working properly. You are scheduled for a device check from home on 07/23/12. You may send your transmission at any time that day. If you have a wireless device, the transmission will be sent automatically. After your physician reviews your transmission, you will receive a postcard with your next transmission date.    

## 2012-04-24 DIAGNOSIS — H269 Unspecified cataract: Secondary | ICD-10-CM | POA: Diagnosis not present

## 2012-04-24 DIAGNOSIS — H2510 Age-related nuclear cataract, unspecified eye: Secondary | ICD-10-CM | POA: Diagnosis not present

## 2012-05-03 ENCOUNTER — Ambulatory Visit (HOSPITAL_COMMUNITY)
Admission: RE | Admit: 2012-05-03 | Discharge: 2012-05-03 | Disposition: A | Discharge status: Home / Self Care | Payer: Medicare Other | Source: Ambulatory Visit | Attending: Internal Medicine | Admitting: Internal Medicine

## 2012-05-03 DIAGNOSIS — Z1231 Encounter for screening mammogram for malignant neoplasm of breast: Secondary | ICD-10-CM

## 2012-05-04 ENCOUNTER — Other Ambulatory Visit: Payer: Self-pay | Admitting: Internal Medicine

## 2012-05-04 DIAGNOSIS — R928 Other abnormal and inconclusive findings on diagnostic imaging of breast: Secondary | ICD-10-CM

## 2012-05-10 ENCOUNTER — Other Ambulatory Visit: Payer: Self-pay | Admitting: Internal Medicine

## 2012-05-10 ENCOUNTER — Other Ambulatory Visit: Payer: Self-pay | Admitting: Family Medicine

## 2012-05-10 ENCOUNTER — Encounter: Payer: Self-pay | Admitting: Internal Medicine

## 2012-05-10 DIAGNOSIS — R928 Other abnormal and inconclusive findings on diagnostic imaging of breast: Secondary | ICD-10-CM

## 2012-05-15 ENCOUNTER — Ambulatory Visit
Admission: RE | Admit: 2012-05-15 | Discharge: 2012-05-15 | Disposition: A | Discharge status: Home / Self Care | Payer: Medicare Other | Source: Ambulatory Visit | Attending: Internal Medicine | Admitting: Internal Medicine

## 2012-05-15 DIAGNOSIS — R928 Other abnormal and inconclusive findings on diagnostic imaging of breast: Secondary | ICD-10-CM

## 2012-05-24 DIAGNOSIS — D485 Neoplasm of uncertain behavior of skin: Secondary | ICD-10-CM | POA: Diagnosis not present

## 2012-05-24 DIAGNOSIS — D235 Other benign neoplasm of skin of trunk: Secondary | ICD-10-CM | POA: Diagnosis not present

## 2012-05-24 DIAGNOSIS — D1801 Hemangioma of skin and subcutaneous tissue: Secondary | ICD-10-CM | POA: Diagnosis not present

## 2012-05-24 DIAGNOSIS — L570 Actinic keratosis: Secondary | ICD-10-CM | POA: Diagnosis not present

## 2012-07-06 ENCOUNTER — Other Ambulatory Visit: Payer: Self-pay | Admitting: Internal Medicine

## 2012-07-06 NOTE — Telephone Encounter (Signed)
Refill done.  

## 2012-07-23 ENCOUNTER — Ambulatory Visit (INDEPENDENT_AMBULATORY_CARE_PROVIDER_SITE_OTHER): Payer: Medicare Other | Admitting: *Deleted

## 2012-07-23 ENCOUNTER — Encounter: Payer: Self-pay | Admitting: Internal Medicine

## 2012-07-23 ENCOUNTER — Other Ambulatory Visit: Payer: Self-pay | Admitting: Internal Medicine

## 2012-07-23 DIAGNOSIS — Z950 Presence of cardiac pacemaker: Secondary | ICD-10-CM

## 2012-07-23 DIAGNOSIS — I441 Atrioventricular block, second degree: Secondary | ICD-10-CM

## 2012-07-30 LAB — REMOTE PACEMAKER DEVICE
AL IMPEDENCE PM: 464 Ohm
AL THRESHOLD: 0.375 V
ATRIAL PACING PM: 17
BATTERY VOLTAGE: 2.76 V
RV LEAD IMPEDENCE PM: 560 Ohm
VENTRICULAR PACING PM: 0

## 2012-08-08 ENCOUNTER — Other Ambulatory Visit: Payer: Self-pay | Admitting: Internal Medicine

## 2012-08-08 ENCOUNTER — Encounter: Payer: Self-pay | Admitting: *Deleted

## 2012-08-08 NOTE — Telephone Encounter (Signed)
Pt made aware OV & lab work are due.  Refill done.

## 2012-08-13 ENCOUNTER — Encounter: Payer: Self-pay | Admitting: *Deleted

## 2012-09-07 ENCOUNTER — Ambulatory Visit (INDEPENDENT_AMBULATORY_CARE_PROVIDER_SITE_OTHER): Payer: Medicare Other | Admitting: Internal Medicine

## 2012-09-07 ENCOUNTER — Encounter: Payer: Self-pay | Admitting: Internal Medicine

## 2012-09-07 VITALS — BP 124/80 | HR 74 | Temp 97.9°F | Ht 65.5 in | Wt 126.0 lb

## 2012-09-07 DIAGNOSIS — M899 Disorder of bone, unspecified: Secondary | ICD-10-CM

## 2012-09-07 DIAGNOSIS — M949 Disorder of cartilage, unspecified: Secondary | ICD-10-CM

## 2012-09-07 DIAGNOSIS — I4891 Unspecified atrial fibrillation: Secondary | ICD-10-CM | POA: Diagnosis not present

## 2012-09-07 DIAGNOSIS — I10 Essential (primary) hypertension: Secondary | ICD-10-CM | POA: Diagnosis not present

## 2012-09-07 DIAGNOSIS — Z0000 Encounter for general adult medical examination without abnormal findings: Secondary | ICD-10-CM | POA: Diagnosis not present

## 2012-09-07 DIAGNOSIS — E785 Hyperlipidemia, unspecified: Secondary | ICD-10-CM | POA: Diagnosis not present

## 2012-09-07 LAB — CBC WITH DIFFERENTIAL/PLATELET
Basophils Absolute: 0 10*3/uL (ref 0.0–0.1)
HCT: 40.9 % (ref 36.0–46.0)
Lymphs Abs: 1.9 10*3/uL (ref 0.7–4.0)
MCV: 87.5 fl (ref 78.0–100.0)
Monocytes Absolute: 0.6 10*3/uL (ref 0.1–1.0)
Monocytes Relative: 9.6 % (ref 3.0–12.0)
Neutrophils Relative %: 56.9 % (ref 43.0–77.0)
Platelets: 230 10*3/uL (ref 150.0–400.0)
RDW: 14 % (ref 11.5–14.6)
WBC: 6.1 10*3/uL (ref 4.5–10.5)

## 2012-09-07 LAB — COMPREHENSIVE METABOLIC PANEL
ALT: 22 U/L (ref 0–35)
Albumin: 3.9 g/dL (ref 3.5–5.2)
CO2: 24 mEq/L (ref 19–32)
Chloride: 104 mEq/L (ref 96–112)
GFR: 84.73 mL/min (ref >60.00)
Glucose, Bld: 84 mg/dL (ref 70–99)
Potassium: 4.2 mEq/L (ref 3.5–5.1)
Sodium: 140 mEq/L (ref 135–145)
Total Bilirubin: 0.8 mg/dL (ref 0.3–1.2)
Total Protein: 7.2 g/dL (ref 6.0–8.3)

## 2012-09-07 LAB — LIPID PANEL
Cholesterol: 206 mg/dL — ABNORMAL HIGH (ref 0–200)
Triglycerides: 50 mg/dL (ref 0.0–149.0)

## 2012-09-07 NOTE — Assessment & Plan Note (Addendum)
Good compliance with medication, ambulatory BPs normal,  no change, labs

## 2012-09-07 NOTE — Patient Instructions (Addendum)
Take calcium and vitamin D daily. Next visit in one year. --- Fall Prevention and Home Safety Falls cause injuries and can affect all age groups. It is possible to use preventive measures to significantly decrease the likelihood of falls. There are many simple measures which can make your home safer and prevent falls. OUTDOORS  Repair cracks and edges of walkways and driveways.  Remove high doorway thresholds.  Trim shrubbery on the main path into your home.  Have good outside lighting.  Clear walkways of tools, rocks, debris, and clutter.  Check that handrails are not broken and are securely fastened. Both sides of steps should have handrails.  Have leaves, snow, and ice cleared regularly.  Use sand or salt on walkways during winter months.  In the garage, clean up grease or oil spills. BATHROOM  Install night lights.  Install grab bars by the toilet and in the tub and shower.  Use non-skid mats or decals in the tub or shower.  Place a plastic non-slip stool in the shower to sit on, if needed.  Keep floors dry and clean up all water on the floor immediately.  Remove soap buildup in the tub or shower on a regular basis.  Secure bath mats with non-slip, double-sided rug tape.  Remove throw rugs and tripping hazards from the floors. BEDROOMS  Install night lights.  Make sure a bedside light is easy to reach.  Do not use oversized bedding.  Keep a telephone by your bedside.  Have a firm chair with side arms to use for getting dressed.  Remove throw rugs and tripping hazards from the floor. KITCHEN  Keep handles on pots and pans turned toward the center of the stove. Use back burners when possible.  Clean up spills quickly and allow time for drying.  Avoid walking on wet floors.  Avoid hot utensils and knives.  Position shelves so they are not too high or low.  Place commonly used objects within easy reach.  If necessary, use a sturdy step stool with a  grab bar when reaching.  Keep electrical cables out of the way.  Do not use floor polish or wax that makes floors slippery. If you must use wax, use non-skid floor wax.  Remove throw rugs and tripping hazards from the floor. STAIRWAYS  Never leave objects on stairs.  Place handrails on both sides of stairways and use them. Fix any loose handrails. Make sure handrails on both sides of the stairways are as long as the stairs.  Check carpeting to make sure it is firmly attached along stairs. Make repairs to worn or loose carpet promptly.  Avoid placing throw rugs at the top or bottom of stairways, or properly secure the rug with carpet tape to prevent slippage. Get rid of throw rugs, if possible.  Have an electrician put in a light switch at the top and bottom of the stairs. OTHER FALL PREVENTION TIPS  Wear low-heel or rubber-soled shoes that are supportive and fit well. Wear closed toe shoes.  When using a stepladder, make sure it is fully opened and both spreaders are firmly locked. Do not climb a closed stepladder.  Add color or contrast paint or tape to grab bars and handrails in your home. Place contrasting color strips on first and last steps.  Learn and use mobility aids as needed. Install an electrical emergency response system.  Turn on lights to avoid dark areas. Replace light bulbs that burn out immediately. Get light switches that glow.  Arrange furniture to create clear pathways. Keep furniture in the same place.  Firmly attach carpet with non-skid or double-sided tape.  Eliminate uneven floor surfaces.  Select a carpet pattern that does not visually hide the edge of steps.  Be aware of all pets. OTHER HOME SAFETY TIPS  Set the water temperature for 120 F (48.8 C).  Keep emergency numbers on or near the telephone.  Keep smoke detectors on every level of the home and near sleeping areas. Document Released: 03/11/2002 Document Revised: 09/20/2011 Document  Reviewed: 06/10/2011 [facility] Patient Information 2014 [place], [place].

## 2012-09-07 NOTE — Progress Notes (Signed)
Subjective:    Patient ID: Peggy Mendez, female    DOB: 09-16-1926, 79 y.o.   MRN: [id number]  HPI  Here for Medicare AWV:  1. Risk factors based on Past M, S, F history: reviewed  2. Physical Activities: Very active, walks almost every day  3. Depression/mood: (-) screening  4. Hearing: No problems noted or reported  5. ADL's: independent . drives 6. Fall Risk: no recent falls, prevention discussed  7. Home Safety: does feel safe at home  8. Height, weight, &visual acuity: See VS, vision corrected w/ glasses , s/p cataracts surgery 9. Counseling: provided counseling about future care, has a living will, POA, etc  10. Labs ordered based on risk factors: yes  11. Referral Coordination:if needed  12. Care Plan, see a/p  13. Cognitive Assessment: motor and cognition seem appropiate for age   In addition we discussed the following: Atrial fibrillation, good compliance with medications, sees cardiology regularly. Stable. High cholesterol, good compliance with medications, no apparent side effects. Hypertension, good compliance with meds, good ambulatory BPs.  Past Medical History  Diagnosis Date  . Hypertension   . Hyperlipidemia   . Pacemaker     permanent for mobitz II 2nd degree AV block (MDT)  . History of colonic polyps   . Osteopenia   . Paroxysmal atrial fibrillation   . Syncope 11/2005    ECHO ao sclerosis (observe per cards)  . Skin cancer 2010    Left leg, SCC, sees derm   Past Surgical History  Procedure Laterality Date  . Ppm      For mobitz II second degree AV block 2008  . Hemorrhoid surgery    . Abdominal hysterectomy    . Oophorectomy      B  . Appendectomy    . Breast biopsy    . Insert / replace / remove pacemaker    . US echocardiography  12/08/2005    EF 55-60%  . Cataracts     History   Social History  . Marital Status: Single    Spouse Name: N/A    Number of Children: 0  . Years of Education: N/A   Occupational History  . retired      Social History Main Topics  . Smoking status: Former Smoker    Quit date: 08/23/1988  . Smokeless tobacco: Never Used     Comment: >20 yrs ago, used to smoke ~ 1 ppd  . Alcohol Use: Yes     Comment: socially  . Drug Use: No  . Sexually Active: Not on file   Other Topics Concern  . Not on file   Social History Narrative   Lives by herself,  still drives, DL independent, Diet- very healthy.    Has a twin sister in [place], a nephew in [place].                  Family History  Problem Relation Age of Onset  . Colon cancer Neg Hx   . Diabetes Neg Hx   . Breast cancer Sister   . Hypertension Sister   . Coronary artery disease      F, brothers x 2other family members  . Heart failure Mother   . Prostate cancer Brother      Review of Systems No chest pain or shortness or breath No nausea, vomiting, diarrhea or blood in the stools. No dysuria or gross hematuria.     Objective:   Physical Exam BP 124/80  Pulse  74  Temp(Src) 97.9 F (36.6 C) (Oral)  Ht 5' 5.5" (1.664 m)  Wt 126 lb (57.153 kg)  BMI 20.64 kg/m2  SpO2 98%  General -- alert, well-developed, NAD Neck --no thyromegaly , normal carotid pulse Breasts-- No mass, nodules, thickening, tenderness, bulging, retraction, inflamation, nipple discharge or skin changes noted.  no axillary lymph nodes Lungs -- normal respiratory effort, no intercostal retractions, no accessory muscle use, and normal breath sounds.   Heart-- normal rate, regular rhythm, no murmur, and no gallop.   Abdomen--soft, non-tender, no distention, palpable non-tender Ao, no bruit Extremities-- no pretibial edema bilaterally  Neurologic-- alert & oriented X3 and strength normal in all extremities. Psych-- Cognition and judgment appear intact. Alert and cooperative with normal attention span and concentration.  not anxious appearing and not depressed appearing.       Assessment & Plan:

## 2012-09-07 NOTE — Assessment & Plan Note (Signed)
Good med ompliance, will check labs

## 2012-09-07 NOTE — Assessment & Plan Note (Addendum)
Td , next 2016 Had a shingles shot 2011  Pneumonia shot 2005 H/o hysterectomy for benign reason remotely, since then had other normal   PAPs, we agreed on no further screening h/o abnormal MMG and breast Bx; multiple (-) MMGs , last 03-2012 f/u by a neg R MMG U/S  Last Cscope 8-09, 2 polyps, due for a Cscope 8-14, pt is actually  inclined to proceed  Continue with her healthy lifestyle Pulsatile mass in the epigastric area--- abd u/s 2013 (-) for AAA

## 2012-09-07 NOTE — Assessment & Plan Note (Signed)
Again declined biphosphonates, see previous entry. Vitamin D was normal when checked . Plan: Continue to be physically active, recommend calcium and vitamin D supplements

## 2012-09-08 ENCOUNTER — Encounter: Payer: Self-pay | Admitting: Internal Medicine

## 2012-09-11 ENCOUNTER — Encounter: Payer: Self-pay | Admitting: *Deleted

## 2012-09-27 ENCOUNTER — Encounter: Payer: Self-pay | Admitting: Internal Medicine

## 2012-10-01 ENCOUNTER — Other Ambulatory Visit: Payer: Self-pay | Admitting: Internal Medicine

## 2012-10-01 ENCOUNTER — Encounter: Payer: Self-pay | Admitting: Internal Medicine

## 2012-10-01 NOTE — Telephone Encounter (Signed)
Refill done.  

## 2012-10-12 ENCOUNTER — Ambulatory Visit (INDEPENDENT_AMBULATORY_CARE_PROVIDER_SITE_OTHER): Payer: Medicare Other | Admitting: Cardiology

## 2012-10-12 ENCOUNTER — Encounter: Payer: Self-pay | Admitting: Cardiology

## 2012-10-12 VITALS — BP 126/68 | HR 85 | Ht 65.5 in | Wt 127.4 lb

## 2012-10-12 DIAGNOSIS — I441 Atrioventricular block, second degree: Secondary | ICD-10-CM | POA: Diagnosis not present

## 2012-10-12 DIAGNOSIS — I4891 Unspecified atrial fibrillation: Secondary | ICD-10-CM

## 2012-10-12 DIAGNOSIS — I10 Essential (primary) hypertension: Secondary | ICD-10-CM | POA: Diagnosis not present

## 2012-10-12 DIAGNOSIS — Z950 Presence of cardiac pacemaker: Secondary | ICD-10-CM | POA: Diagnosis not present

## 2012-10-12 NOTE — Progress Notes (Signed)
D Peggy Mendez Date of Birth: 07-03-1926 Medical Record #[id number]  History of Present Illness: Peggy Mendez is seen for followup. She has a history of symptomatic AV block and is status post pacemaker implant. On pacemaker followup she has been found to have paroxysmal atrial fibrillation. These episodes have been asymptomatic.  She denies any chest pain. She has no shortness of breath. She is on anticoagulation with Eliquis. Her last pacemaker check in April showed that she was in atrial fibrillation 0.1% of the time with her longest episode lasting just over 3 hours.  Current Outpatient Prescriptions on File Prior to Visit  Medication Sig Dispense Refill  . apixaban (ELIQUIS) 2.5 MG TABS tablet Take 1 tablet (2.5 mg total) by mouth 2 (two) times daily.  180 tablet  3  . atorvastatin (LIPITOR) 20 MG tablet TAKE 1 TABLET BY MOUTH DAILY  90 tablet  0  . calcium citrate-vitamin D 200-200 MG-UNIT TABS Take 1 tablet by mouth daily. Pt takes 300 mg calcium daily.       . multivitamin (THERAGRAN) per tablet Take 1 tablet by mouth daily.        . Omega-3 Fatty Acids (FISH OIL) 1200 MG CAPS Take by mouth. Take one tablet daily       . ramipril (ALTACE) 5 MG capsule TAKE ONE CAPSULE EVERY DAY  90 capsule  1   No current facility-administered medications on file prior to visit.    No Known Allergies  Past Medical History  Diagnosis Date  . Hypertension   . Hyperlipidemia   . Pacemaker     permanent for mobitz II 2nd degree AV block (MDT)  . History of colonic polyps   . Osteopenia   . Paroxysmal atrial fibrillation   . Syncope 11/2005    ECHO ao sclerosis (observe per cards)  . Skin cancer 2010    Left leg, SCC, sees derm    Past Surgical History  Procedure Laterality Date  . Ppm      For mobitz II second degree AV block 2008  . Hemorrhoid surgery    . Abdominal hysterectomy    . Oophorectomy      B  . Appendectomy    . Breast biopsy    . Insert / replace / remove pacemaker     . US echocardiography  12/08/2005    EF 55-60%  . Cataracts      History  Smoking status  . Former Smoker  . Quit date: 08/23/1988  Smokeless tobacco  . Never Used    Comment: >20 yrs ago, used to smoke ~ 1 ppd    History  Alcohol Use  . Yes    Comment: socially    Family History  Problem Relation Age of Onset  . Colon cancer Neg Hx   . Diabetes Neg Hx   . Breast cancer Sister   . Hypertension Sister   . Coronary artery disease      F, brothers x 2other family members  . Heart failure Mother   . Prostate cancer Brother     Review of Systems: As noted in history of present illness.  All other systems were reviewed and are negative.  Physical Exam: BP 126/68  Pulse 85  Ht 5' 5.5" (1.664 m)  Wt 127 lb 6.4 oz (57.788 kg)  BMI 20.87 kg/m2  SpO2 98% She is very pleasant and in no distress. The skin is warm and dry.   The HEENT exam is normal. The carotids  are 2+ without bruits.  There is no thyromegaly.  There is no JVD.  The lungs are clear.  Her pacemaker site is normal. The heart exam reveals a regular rate with a normal S1 and S2.  There are no murmurs, gallops, or rubs.  The PMI is not displaced.     There is no hepatosplenomegaly or tenderness.  There are no masses.  Exam of the legs reveal no clubbing, cyanosis, or edema.  The legs are without rashes.  The distal pulses are intact.  Cranial nerves II - XII are intact.  Motor and sensory functions are intact.  The gait is normal.  LABORATORY DATA: Lab Results  Component Value Date   WBC 6.1 09/07/2012   HGB 13.7 09/07/2012   HCT 40.9 09/07/2012   PLT 230.0 09/07/2012   GLUCOSE 84 09/07/2012   CHOL 206* 09/07/2012   TRIG 50.0 09/07/2012   HDL 89.30 09/07/2012   LDLDIRECT 105.8 09/07/2012   LDLCALC 83 10/31/2011   ALT 22 09/07/2012   AST 30 09/07/2012   NA 140 09/07/2012   K 4.2 09/07/2012   CL 104 09/07/2012   CREATININE 0.7 09/07/2012   BUN 19 09/07/2012   CO2 24 09/07/2012   TSH 2.12 09/07/2012     Assessment / Plan: 1. Paroxysmal  atrial fibrillation. Patient is asymptomatic.  Continue on Apixaban 2.5 mg twice a day due to age.   2. Mobitz type 2 second degree AV block status post pacemaker implant. Keep follow up in pacemaker clinic.  3. Hypertension, controlled.  4. Hyperlipidemia.

## 2012-10-12 NOTE — Patient Instructions (Signed)
Continue your current therapy  I will see you in 6 months.   

## 2012-10-29 ENCOUNTER — Ambulatory Visit (INDEPENDENT_AMBULATORY_CARE_PROVIDER_SITE_OTHER): Payer: Medicare Other | Admitting: *Deleted

## 2012-10-29 ENCOUNTER — Encounter: Payer: Self-pay | Admitting: Internal Medicine

## 2012-10-29 DIAGNOSIS — Z950 Presence of cardiac pacemaker: Secondary | ICD-10-CM | POA: Diagnosis not present

## 2012-10-29 DIAGNOSIS — I441 Atrioventricular block, second degree: Secondary | ICD-10-CM | POA: Diagnosis not present

## 2012-11-02 DIAGNOSIS — H524 Presbyopia: Secondary | ICD-10-CM | POA: Diagnosis not present

## 2012-11-02 DIAGNOSIS — H35039 Hypertensive retinopathy, unspecified eye: Secondary | ICD-10-CM | POA: Diagnosis not present

## 2012-11-02 DIAGNOSIS — Z961 Presence of intraocular lens: Secondary | ICD-10-CM | POA: Diagnosis not present

## 2012-11-02 DIAGNOSIS — H26499 Other secondary cataract, unspecified eye: Secondary | ICD-10-CM | POA: Diagnosis not present

## 2012-11-03 ENCOUNTER — Other Ambulatory Visit: Payer: Self-pay | Admitting: Internal Medicine

## 2012-11-05 LAB — REMOTE PACEMAKER DEVICE
AL THRESHOLD: 0.375 V
ATRIAL PACING PM: 14
BAMS-0001: 175 {beats}/min
RV LEAD AMPLITUDE: 2.4 mv
RV LEAD THRESHOLD: 0.875 V

## 2012-11-05 NOTE — Telephone Encounter (Signed)
Refill done.  

## 2012-11-12 ENCOUNTER — Ambulatory Visit (INDEPENDENT_AMBULATORY_CARE_PROVIDER_SITE_OTHER): Payer: Medicare Other | Admitting: Internal Medicine

## 2012-11-12 ENCOUNTER — Telehealth: Payer: Self-pay

## 2012-11-12 ENCOUNTER — Encounter: Payer: Self-pay | Admitting: Internal Medicine

## 2012-11-12 VITALS — BP 114/68 | HR 84 | Ht 65.25 in | Wt 129.2 lb

## 2012-11-12 DIAGNOSIS — Z860100 Personal history of colon polyps, unspecified: Secondary | ICD-10-CM | POA: Insufficient documentation

## 2012-11-12 DIAGNOSIS — I4891 Unspecified atrial fibrillation: Secondary | ICD-10-CM

## 2012-11-12 DIAGNOSIS — Z7901 Long term (current) use of anticoagulants: Secondary | ICD-10-CM

## 2012-11-12 DIAGNOSIS — I480 Paroxysmal atrial fibrillation: Secondary | ICD-10-CM

## 2012-11-12 MED ORDER — SOD PICOSULFATE-MAG OX-CIT ACD 10-3.5-12 MG-GM-GM PO PACK: 1.0000 | PACK | Freq: Once | ORAL | Status: DC

## 2012-11-12 NOTE — Telephone Encounter (Signed)
Ok to hold Eliquis for 48 hours prior to colonoscopy.  Peter [place] MD, [facility]

## 2012-11-12 NOTE — Telephone Encounter (Signed)
Patient called Dr.Jordan advised ok to hold eliquis 48 hrs prior to colonoscopy.

## 2012-11-12 NOTE — Telephone Encounter (Signed)
[facility] [address] [place]. [address]   11/12/2012    RE: Peggy Mendez DOB: 12/22/1926 MRN: [id number]   Dear Dr. Peter Swaziland,    We have scheduled the above patient for an endoscopic procedure. Our records show that she is on anticoagulation therapy.   Please advise as to how long the patient may come off her therapy of Eliquis prior to the colonoscopy procedure, which is scheduled for 11/27/12.  Please fax back/ or route the completed form to Emiyah Spraggins [place], CMA at [phone].   Sincerely,  [place], Clayborne Dana

## 2012-11-12 NOTE — Progress Notes (Signed)
  Subjective:    Patient ID: Peggy Mendez, female    DOB: 03/31/1927, 78 y.o.   MRN: [id number]  HPI The patient is a delightful elderly woman with a history of adenomatous colon polyps, last procedure 2009. She is very active, lives independently, and her mother lived to 57. Thus, she says she is interested in repeating a colonoscopy because of potential colon cancer and ability to prevent it. She has PAF and is on apixaban, treated by Dr. Swaziland. Allergies  Allergen Reactions  . Anesthetics, Ester     Anesthetics not particularly Ester   Outpatient Prescriptions Prior to Visit  Medication Sig Dispense Refill  . apixaban (ELIQUIS) 2.5 MG TABS tablet Take 1 tablet (2.5 mg total) by mouth 2 (two) times daily.  180 tablet  3  . atorvastatin (LIPITOR) 20 MG tablet TAKE 1 TABLET BY MOUTH DAILY  90 tablet  1  . multivitamin (THERAGRAN) per tablet Take 1 tablet by mouth daily.        . Omega-3 Fatty Acids (FISH OIL) 1200 MG CAPS Take by mouth. Take one tablet daily       . ramipril (ALTACE) 5 MG capsule TAKE ONE CAPSULE EVERY DAY  90 capsule  1  . calcium citrate-vitamin D 200-200 MG-UNIT TABS Take 1 tablet by mouth daily. Pt takes 300 mg calcium daily.        No facility-administered medications prior to visit.   Past Medical History  Diagnosis Date  . Hypertension   . Hyperlipidemia   . Pacemaker     permanent for mobitz II 2nd degree AV block (MDT)  . History of colonic polyps   . Osteopenia   . Paroxysmal atrial fibrillation   . Syncope 11/2005    ECHO ao sclerosis (observe per cards)  . Skin cancer 2010    Left leg, SCC, sees derm   Past Surgical History  Procedure Laterality Date  . Ppm      For mobitz II second degree AV block 2008  . Hemorrhoid surgery    . Total abdominal hysterectomy    . Appendectomy    . Breast biopsy Bilateral     several  . Insert / replace / remove pacemaker    . US echocardiography  12/08/2005    EF 55-60%  . Cataracts Bilateral   .  Colonoscopy w/ biopsies      Review of Systems Joint pain, otherwise negative.    Objective:   Physical Exam General:  NAD Eyes:   anicteric Lungs:  clear Heart:  S1S2 no rubs, murmurs or gallops Abdomen:  soft and nontender, BS+ Ext:   no edema  Data Reviewed:  2009 colonoscopy Recent cardiology note     Assessment & Plan:  Personal history of colonic polyps  PAF (paroxysmal atrial fibrillation)  Long term (current) use of anticoagulants - apixiban

## 2012-11-12 NOTE — Patient Instructions (Addendum)
You have been scheduled for a colonoscopy with propofol. Please follow written instructions given to you at your visit today.  Please use the prep kit we have given you today, prepopik. If you use inhalers (even only as needed), please bring them with you on the day of your procedure. Your physician has requested that you go to www.startemmi.com and enter the access code given to you at your visit today. This web site gives a general overview about your procedure. However, you should still follow specific instructions given to you by our office regarding your preparation for the procedure.  You will be contaced by our office prior to your procedure for directions on holding your eliquis.  If you do not hear from our office 1 week prior to your scheduled procedure, please call [phone] to discuss.  I appreciate the opportunity to care for you.

## 2012-11-12 NOTE — Assessment & Plan Note (Signed)
Given her high functional status and longevity of her mother - it is reasonable to pursue colonoscopy now. She will get peace of mind and reassurance also - which should help quality of life. She is aware of risks, benefits and indications including increased risk of rare but possible stroke off apixiban, which needs to be held 48 hrs before colonoscopy to reduce bleeding risk. We discussed all of this and I answered her ?'s.  Will schedule the procedure and clarify holding apixiban with Dr. Swaziland.Marland Kitchen

## 2012-11-14 ENCOUNTER — Encounter: Payer: Self-pay | Admitting: *Deleted

## 2012-11-19 NOTE — Telephone Encounter (Signed)
Left message to call me back regarding holding her Eliquis.  Since she takes it 7AM and 7PM her last dose will be 7AM on 11/25/12.

## 2012-11-19 NOTE — Telephone Encounter (Signed)
Spoke to patient and she verbalized understanding regarding holding the Eliquis for 48 hours prior to her colonoscopy.

## 2012-11-27 ENCOUNTER — Ambulatory Visit (AMBULATORY_SURGERY_CENTER): Payer: Medicare Other | Admitting: Internal Medicine

## 2012-11-27 ENCOUNTER — Encounter: Payer: Self-pay | Admitting: Internal Medicine

## 2012-11-27 VITALS — BP 142/68 | HR 72 | Temp 98.4°F | Resp 38 | Ht 65.25 in | Wt 129.0 lb

## 2012-11-27 DIAGNOSIS — D126 Benign neoplasm of colon, unspecified: Secondary | ICD-10-CM | POA: Diagnosis not present

## 2012-11-27 DIAGNOSIS — I441 Atrioventricular block, second degree: Secondary | ICD-10-CM | POA: Diagnosis not present

## 2012-11-27 DIAGNOSIS — Z86010 Personal history of colonic polyps: Secondary | ICD-10-CM | POA: Diagnosis not present

## 2012-11-27 DIAGNOSIS — Z8679 Personal history of other diseases of the circulatory system: Secondary | ICD-10-CM | POA: Diagnosis not present

## 2012-11-27 DIAGNOSIS — K5520 Angiodysplasia of colon without hemorrhage: Secondary | ICD-10-CM | POA: Insufficient documentation

## 2012-11-27 DIAGNOSIS — I10 Essential (primary) hypertension: Secondary | ICD-10-CM | POA: Diagnosis not present

## 2012-11-27 MED ORDER — SODIUM CHLORIDE 0.9 % IV SOLN: 500.0000 mL | INTRAVENOUS | Status: DC

## 2012-11-27 NOTE — Progress Notes (Signed)
Called to room to assist during endoscopic procedure.  Patient ID and intended procedure confirmed with present staff. Received instructions for my participation in the procedure from the performing physician.  

## 2012-11-27 NOTE — Op Note (Signed)
[facility] [address] [place], [address]   COLONOSCOPY PROCEDURE REPORT  PATIENT: Peggy Mendez, Peggy Mendez.  MR#: [id number] BIRTHDATE: Feb 27, 1927 , 86  yrs. old GENDER: Female ENDOSCOPIST: Iva Boop, MD, [facility] PROCEDURE DATE:  11/27/2012 PROCEDURE:   Colonoscopy with snare polypectomy First Screening Colonoscopy - Avg.  risk and is 50 yrs.  old or older - No.  Prior Negative Screening - Now for repeat screening. N/A  History of Adenoma - Now for follow-up colonoscopy & has been > or = to 3 yrs.  Yes hx of adenoma.  Has been 3 or more years since last colonoscopy.  Polyps Removed Today? Yes. ASA CLASS:   Class III INDICATIONS:Patient's personal history of adenomatous colon polyps.  MEDICATIONS: propofol (Diprivan) 200mg  IV, MAC sedation, administered by CRNA, and These medications were titrated to patient response per physician's verbal order  DESCRIPTION OF PROCEDURE:   After the risks benefits and alternatives of the procedure were thoroughly explained, informed consent was obtained.  A digital rectal exam revealed no abnormalities of the rectum.   The LB [id number]  endoscope was introduced through the anus and advanced to the cecum, which was identified by both the appendix and ileocecal valve. No adverse events experienced.   The quality of the prep was Prepopik excellent  The instrument was then slowly withdrawn as the colon was fully examined.   COLON FINDINGS: A sessile polyp measuring 6 mm in size was found at the hepatic flexure.  A polypectomy was performed with a cold snare.  The resection was complete and the polyp tissue was completely retrieved.   2 mm angiodysplastic lesion was found in the ascending colon.   The colon mucosa was otherwise normal. Retroflexed views revealed no abnormalities. The time to cecum=4 minutes 18 seconds.  Withdrawal time=10 minutes 50 seconds.  The scope was withdrawn and the procedure completed. COMPLICATIONS:  There were no complications.  ENDOSCOPIC IMPRESSION: 1.   Sessile polyp measuring 6 mm in size was found at the hepatic flexure; polypectomy was performed with a cold snare 2.   2mm angiodysplastic lesion and in the ascending colon 3.   The colon mucosa was otherwise normal  RECOMMENDATIONS: Will send a letter about polyp results - would not recommend routine repeat colonoscopy. Resume apixiban tomorrow.   eSigned:  Iva Boop, MD, [facility] 11/27/2012 3:44 PM  cc: The Patient

## 2012-11-27 NOTE — Progress Notes (Signed)
Patient did not experience any of the following events: a burn prior to discharge; a fall within the facility; wrong site/side/patient/procedure/implant event; or a hospital transfer or hospital admission upon discharge from the facility. (G8907) Patient did not have preoperative order for IV antibiotic SSI prophylaxis. (G8918)  

## 2012-11-27 NOTE — Patient Instructions (Addendum)
I found and removed one small polyp, there was a tiny AVM (also called angiodysplasia - blood vessels grow on surface of colon lining) and everything else was ok.  I will let you know the pathology results by mail. I don't think I am going to recommend another routine colonoscopy in 5 years   I appreciate the opportunity to care for you.  Iva Boop, MD, FACG  YOU HAD AN ENDOSCOPIC PROCEDURE TODAY AT THE [facility]: Refer to the procedure report that was given to you for any specific questions about what was found during the examination.  If the procedure report does not answer your questions, please call your gastroenterologist to clarify.  If you requested that your care partner not be given the details of your procedure findings, then the procedure report has been included in a sealed envelope for you to review at your convenience later.  YOU SHOULD EXPECT: Some feelings of bloating in the abdomen. Passage of more gas than usual.  Walking can help get rid of the air that was put into your GI tract during the procedure and reduce the bloating. If you had a lower endoscopy (such as a colonoscopy or flexible sigmoidoscopy) you may notice spotting of blood in your stool or on the toilet paper. If you underwent a bowel prep for your procedure, then you may not have a normal bowel movement for a few days.  DIET: Your first meal following the procedure should be a light meal and then it is ok to progress to your normal diet.  A half-sandwich or bowl of soup is an example of a good first meal.  Heavy or fried foods are harder to digest and may make you feel nauseous or bloated.  Likewise meals heavy in dairy and vegetables can cause extra gas to form and this can also increase the bloating.  Drink plenty of fluids but you should avoid alcoholic beverages for 24 hours.  ACTIVITY: Your care partner should take you home directly after the procedure.  You should plan to take it easy, moving  slowly for the rest of the day.  You can resume normal activity the day after the procedure however you should NOT DRIVE or use heavy machinery for 24 hours (because of the sedation medicines used during the test).    SYMPTOMS TO REPORT IMMEDIATELY: A gastroenterologist can be reached at any hour.  During normal business hours, 8:30 AM to 5:00 PM Monday through Friday, call [phone].  After hours and on weekends, please call the GI answering service at [phone] who will take a message and have the physician on call contact you.   Following lower endoscopy (colonoscopy or flexible sigmoidoscopy):  Excessive amounts of blood in the stool  Significant tenderness or worsening of abdominal pains  Swelling of the abdomen that is new, acute  Fever of 100F or higher   FOLLOW UP: If any biopsies were taken you will be contacted by phone or by letter within the next 1-3 weeks.  Call your gastroenterologist if you have not heard about the biopsies in 3 weeks.  Our staff will call the home number listed on your records the next business day following your procedure to check on you and address any questions or concerns that you may have at that time regarding the information given to you following your procedure. This is a courtesy call and so if there is no answer at the home number and we have not heard  from you through the emergency physician on call, we will assume that you have returned to your regular daily activities without incident.  SIGNATURES/CONFIDENTIALITY: You and/or your care partner have signed paperwork which will be entered into your electronic medical record.  These signatures attest to the fact that that the information above on your After Visit Summary has been reviewed and is understood.  Full responsibility of the confidentiality of this discharge information lies with you and/or your care-partner.   Polyp information given.    Resume apixiban tomorrrow.

## 2012-11-27 NOTE — Progress Notes (Signed)
Propofol given over incremental dosages 

## 2012-11-28 ENCOUNTER — Telehealth: Payer: Self-pay | Admitting: *Deleted

## 2012-11-28 NOTE — Telephone Encounter (Signed)
No answer, left message to call if questions or concerns. 

## 2012-12-05 ENCOUNTER — Encounter: Payer: Self-pay | Admitting: Internal Medicine

## 2012-12-05 NOTE — Progress Notes (Signed)
Quick Note:  Diminutive adenoma - at her age would not do routine repeat colonoscopy ______

## 2013-02-04 ENCOUNTER — Ambulatory Visit (INDEPENDENT_AMBULATORY_CARE_PROVIDER_SITE_OTHER): Payer: Medicare Other | Admitting: *Deleted

## 2013-02-04 DIAGNOSIS — I441 Atrioventricular block, second degree: Secondary | ICD-10-CM

## 2013-02-04 DIAGNOSIS — Z950 Presence of cardiac pacemaker: Secondary | ICD-10-CM

## 2013-02-05 LAB — REMOTE PACEMAKER DEVICE
AL AMPLITUDE: 2.8 mv
AL IMPEDENCE PM: 465 Ohm
BAMS-0001: 175 {beats}/min
BATTERY VOLTAGE: 2.75 V
RV LEAD IMPEDENCE PM: 488 Ohm
VENTRICULAR PACING PM: 0

## 2013-02-11 ENCOUNTER — Encounter: Payer: Self-pay | Admitting: *Deleted

## 2013-02-14 ENCOUNTER — Encounter: Payer: Self-pay | Admitting: Internal Medicine

## 2013-03-21 DIAGNOSIS — L819 Disorder of pigmentation, unspecified: Secondary | ICD-10-CM | POA: Diagnosis not present

## 2013-03-21 DIAGNOSIS — D235 Other benign neoplasm of skin of trunk: Secondary | ICD-10-CM | POA: Diagnosis not present

## 2013-03-21 DIAGNOSIS — D1801 Hemangioma of skin and subcutaneous tissue: Secondary | ICD-10-CM | POA: Diagnosis not present

## 2013-03-21 DIAGNOSIS — L570 Actinic keratosis: Secondary | ICD-10-CM | POA: Diagnosis not present

## 2013-03-29 ENCOUNTER — Other Ambulatory Visit: Payer: Self-pay | Admitting: Internal Medicine

## 2013-04-01 NOTE — Telephone Encounter (Signed)
Ramipril refilled per protocol. JG//CMA 

## 2013-04-11 ENCOUNTER — Other Ambulatory Visit: Payer: Self-pay | Admitting: Internal Medicine

## 2013-04-11 DIAGNOSIS — Z1231 Encounter for screening mammogram for malignant neoplasm of breast: Secondary | ICD-10-CM

## 2013-04-16 ENCOUNTER — Encounter: Payer: Self-pay | Admitting: Cardiology

## 2013-04-16 ENCOUNTER — Ambulatory Visit (INDEPENDENT_AMBULATORY_CARE_PROVIDER_SITE_OTHER): Payer: Medicare Other | Admitting: Cardiology

## 2013-04-16 VITALS — BP 130/70 | HR 88 | Ht 62.5 in | Wt 128.4 lb

## 2013-04-16 DIAGNOSIS — I10 Essential (primary) hypertension: Secondary | ICD-10-CM

## 2013-04-16 DIAGNOSIS — I4891 Unspecified atrial fibrillation: Secondary | ICD-10-CM | POA: Diagnosis not present

## 2013-04-16 DIAGNOSIS — I441 Atrioventricular block, second degree: Secondary | ICD-10-CM | POA: Diagnosis not present

## 2013-04-16 MED ORDER — APIXABAN 2.5 MG PO TABS: 2.5000 mg | ORAL_TABLET | Freq: Two times a day (BID) | ORAL | Status: DC

## 2013-04-16 NOTE — Patient Instructions (Signed)
Your physician wants you to follow-up in:  6 months. You will receive a reminder letter in the mail two months in advance. If you don't receive a letter, please call our office to schedule the follow-up appointment.   

## 2013-04-16 NOTE — Progress Notes (Signed)
[name] Date of Birth: 15-Apr-1926 Medical Record #[id number]  History of Present Illness: Peggy Mendez is seen for followup. She has a history of symptomatic AV block and is status post pacemaker implant. On pacemaker followup she has been found to have paroxysmal atrial fibrillation. These episodes have been asymptomatic.  She denies any chest pain. She has no shortness of breath. She is on anticoagulation with Eliquis. She had one episode in August where she just felt "off" but this does not correlate with arrhythmia.  Current Outpatient Prescriptions on File Prior to Visit  Medication Sig Dispense Refill  . atorvastatin (LIPITOR) 20 MG tablet TAKE 1 TABLET BY MOUTH DAILY  90 tablet  1  . Calcium Carbonate-Vitamin D (CALCIUM 600/VITAMIN D PO) Take 1 capsule by mouth daily.      . multivitamin (THERAGRAN) per tablet Take 1 tablet by mouth daily.        . Omega-3 Fatty Acids (FISH OIL) 1200 MG CAPS Take by mouth. Take one tablet daily       . ramipril (ALTACE) 5 MG capsule TAKE ONE CAPSULE EVERY DAY  90 capsule  1   No current facility-administered medications on file prior to visit.    Allergies  Allergen Reactions  . Anesthetics, Ester     Anesthetics not particularly Ester    Past Medical History  Diagnosis Date  . Hypertension   . Hyperlipidemia   . Pacemaker     permanent for mobitz II 2nd degree AV block (MDT)  . History of colonic polyps   . Osteopenia   . Paroxysmal atrial fibrillation   . Syncope 11/2005    ECHO ao sclerosis (observe per cards)  . Skin cancer 2010    Left leg, SCC, sees derm  . Cataract     REMOVED    Past Surgical History  Procedure Laterality Date  . Ppm      For mobitz II second degree AV block 2008  . Hemorrhoid surgery    . Total abdominal hysterectomy    . Appendectomy    . Breast biopsy Bilateral     several  . Insert / replace / remove pacemaker    . US echocardiography  12/08/2005    EF 55-60%  . Cataracts Bilateral   .  Colonoscopy w/ biopsies    . Colonoscopy      History  Smoking status  . Former Smoker  . Types: Cigarettes  . Quit date: 08/23/1988  Smokeless tobacco  . Never Used    Comment: >20 yrs ago, used to smoke ~ 1 ppd    History  Alcohol Use  . Yes    Comment: socially    Family History  Problem Relation Age of Onset  . Colon cancer Neg Hx   . Diabetes Neg Hx   . Breast cancer Sister   . Hypertension Sister   . Coronary artery disease      F, brothers x 2other family members  . Heart failure Mother   . Prostate cancer Brother   . Heart disease Father     Review of Systems: As noted in history of present illness.  All other systems were reviewed and are negative.  Physical Exam: BP 130/70  Pulse 88  Ht 5' 2.5" (1.588 m)  Wt 128 lb 6.4 oz (58.242 kg)  BMI 23.10 kg/m2 She is very pleasant and in no distress. The skin is warm and dry.   The HEENT exam is normal. The carotids are  2+ without bruits.  There is no thyromegaly.  There is no JVD.  The lungs are clear.  Her pacemaker site is normal. The heart exam reveals a regular rate with a normal S1 and S2.  There are no murmurs, gallops, or rubs.  The PMI is not displaced.     There is no hepatosplenomegaly or tenderness.  There are no masses.  Exam of the legs reveal no clubbing, cyanosis, or edema.    The distal pulses are intact.  Cranial nerves II - XII are intact.  Motor and sensory functions are intact.  The gait is normal.  LABORATORY DATA: Lab Results  Component Value Date   WBC 6.1 09/07/2012   HGB 13.7 09/07/2012   HCT 40.9 09/07/2012   PLT 230.0 09/07/2012   GLUCOSE 84 09/07/2012   CHOL 206* 09/07/2012   TRIG 50.0 09/07/2012   HDL 89.30 09/07/2012   LDLDIRECT 105.8 09/07/2012   LDLCALC 83 10/31/2011   ALT 22 09/07/2012   AST 30 09/07/2012   NA 140 09/07/2012   K 4.2 09/07/2012   CL 104 09/07/2012   CREATININE 0.7 09/07/2012   BUN 19 09/07/2012   CO2 24 09/07/2012   TSH 2.12 09/07/2012   Ecg: atrial paced rhythm rate 87 bpm, possible  LVH.  Assessment / Plan: 1. Paroxysmal atrial fibrillation. Patient is asymptomatic.  Continue on Apixaban 2.5 mg twice a day due to age.   2. Mobitz type 2 second degree AV block status post pacemaker implant. Keep follow up in pacemaker clinic.  3. Hypertension, controlled.  4. Hyperlipidemia.

## 2013-04-25 ENCOUNTER — Encounter: Payer: Self-pay | Admitting: Internal Medicine

## 2013-04-25 ENCOUNTER — Ambulatory Visit (INDEPENDENT_AMBULATORY_CARE_PROVIDER_SITE_OTHER): Payer: Medicare Other | Admitting: Internal Medicine

## 2013-04-25 VITALS — BP 118/70 | HR 78 | Ht 65.0 in | Wt 129.0 lb

## 2013-04-25 DIAGNOSIS — I441 Atrioventricular block, second degree: Secondary | ICD-10-CM

## 2013-04-25 DIAGNOSIS — I4891 Unspecified atrial fibrillation: Secondary | ICD-10-CM | POA: Diagnosis not present

## 2013-04-25 LAB — MDC_IDC_ENUM_SESS_TYPE_INCLINIC
Battery Impedance: 1187 Ohm
Brady Statistic AP VS Percent: 16 %
Brady Statistic AS VS Percent: 84 %
Date Time Interrogation Session: 20150122105816
Lead Channel Impedance Value: 453 Ohm
Lead Channel Impedance Value: 521 Ohm
Lead Channel Pacing Threshold Pulse Width: 0.4 ms
Lead Channel Pacing Threshold Pulse Width: 0.4 ms
Lead Channel Setting Sensing Sensitivity: 5.6 mV
MDC IDC MSMT BATTERY REMAINING LONGEVITY: 42 mo
MDC IDC MSMT BATTERY VOLTAGE: 2.75 V
MDC IDC MSMT LEADCHNL RA PACING THRESHOLD AMPLITUDE: 0.5 V
MDC IDC MSMT LEADCHNL RA SENSING INTR AMPL: 4 mV
MDC IDC MSMT LEADCHNL RV PACING THRESHOLD AMPLITUDE: 0.75 V
MDC IDC MSMT LEADCHNL RV SENSING INTR AMPL: 15.67 mV
MDC IDC SET LEADCHNL RA PACING AMPLITUDE: 2 V
MDC IDC SET LEADCHNL RV PACING AMPLITUDE: 2.5 V
MDC IDC SET LEADCHNL RV PACING PULSEWIDTH: 0.4 ms
MDC IDC STAT BRADY AP VP PERCENT: 0 %
MDC IDC STAT BRADY AS VP PERCENT: 0 %

## 2013-04-25 NOTE — Progress Notes (Signed)
PCP: Kathlene November, MD Primary Cardiologist:  Dr Martinique  Peggy Mendez is a 80 y.o. female who presents today for routine electrophysiology followup.  Since last being seen in our clinic, the patient reports doing very well.  She remains active for her age.  Today, she denies symptoms of palpitations, chest pain, shortness of breath,  lower extremity edema, dizziness, presyncope, or syncope.  The patient is otherwise without complaint today.   Past Medical History  Diagnosis Date  . Hypertension   . Hyperlipidemia   . Pacemaker     permanent for mobitz II 2nd degree AV block (MDT)  . History of colonic polyps   . Osteopenia   . Paroxysmal atrial fibrillation   . Syncope 11/2005    ECHO ao sclerosis (observe per cards)  . Skin cancer 2010    Left leg, SCC, sees derm  . Cataract     REMOVED   Past Surgical History  Procedure Laterality Date  . Ppm      For mobitz II second degree AV block 2008  . Hemorrhoid surgery    . Total abdominal hysterectomy    . Appendectomy    . Breast biopsy Bilateral     several  . Insert / replace / remove pacemaker    . US echocardiography  12/08/2005    EF 55-60%  . Cataracts Bilateral   . Colonoscopy w/ biopsies    . Colonoscopy      Current Outpatient Prescriptions  Medication Sig Dispense Refill  . apixaban (ELIQUIS) 2.5 MG TABS tablet Take 1 tablet (2.5 mg total) by mouth 2 (two) times daily.  180 tablet  3  . atorvastatin (LIPITOR) 20 MG tablet TAKE 1 TABLET BY MOUTH DAILY  90 tablet  1  . Calcium Carbonate-Vitamin Peggy (CALCIUM 600/VITAMIN Peggy PO) Take 1 capsule by mouth daily.      . multivitamin (THERAGRAN) per tablet Take 1 tablet by mouth daily.        . Omega-3 Fatty Acids (FISH OIL) 1200 MG CAPS Take by mouth. Take one tablet daily       . ramipril (ALTACE) 5 MG capsule TAKE ONE CAPSULE EVERY DAY  90 capsule  1   No current facility-administered medications for this visit.    Physical Exam: Filed Vitals:   04/25/13 1047  BP: 118/70   Pulse: 78  Height: 5\' 5"  (1.651 m)  Weight: 129 lb (58.514 kg)    GEN- The patient is well appearing, alert and oriented x 3 today.   Head- normocephalic, atraumatic Eyes-  Sclera clear, conjunctiva pink Ears- hearing intact Oropharynx- clear Lungs- Clear to ausculation bilaterally, normal work of breathing Heart- Regular rate and rhythm, no murmurs, rubs or gallops, PMI not laterally displaced GI- soft, NT, ND, + BS Extremities- no clubbing, cyanosis, or edema   Assessment and Plan:   AV block, 2nd degre  Normal pacemaker function  See Pace Art report  No changes today   PAROXYSMAL ATRIAL FIBRILLATION  Controlled Appropriately anticoagulated with eliquis 2.5mg  BID  carelink  Return to see Ileene Hutchinson in 1 year Dr Martinique to see as scheduled

## 2013-04-25 NOTE — Patient Instructions (Addendum)
Your physician wants you to follow-up in: 12 months with Peggy Mendez You will receive a reminder letter in the mail two months in advance. If you don't receive a letter, please call our office to schedule the follow-up appointment.    Remote monitoring is used to monitor your Pacemaker or ICD from home. This monitoring reduces the number of office visits required to check your device to one time per year. It allows [place] to keep an eye on the functioning of your device to ensure it is working properly. You are scheduled for a device check from home on 07/29/13. You may send your transmission at any time that day. If you have a wireless device, the transmission will be sent automatically. After your physician reviews your transmission, you will receive a postcard with your next transmission date.

## 2013-04-30 ENCOUNTER — Other Ambulatory Visit: Payer: Self-pay | Admitting: Internal Medicine

## 2013-04-30 DIAGNOSIS — E785 Hyperlipidemia, unspecified: Secondary | ICD-10-CM

## 2013-04-30 NOTE — Telephone Encounter (Signed)
Refill for lipitor sent to [facility] in [place]

## 2013-05-07 ENCOUNTER — Ambulatory Visit (HOSPITAL_COMMUNITY)
Admission: RE | Admit: 2013-05-07 | Discharge: 2013-05-07 | Disposition: A | Discharge status: Home / Self Care | Payer: Medicare Other | Source: Ambulatory Visit | Attending: Internal Medicine | Admitting: Internal Medicine

## 2013-05-07 DIAGNOSIS — Z1231 Encounter for screening mammogram for malignant neoplasm of breast: Secondary | ICD-10-CM | POA: Insufficient documentation

## 2013-07-29 ENCOUNTER — Ambulatory Visit (INDEPENDENT_AMBULATORY_CARE_PROVIDER_SITE_OTHER): Payer: Medicare Other | Admitting: *Deleted

## 2013-07-29 DIAGNOSIS — I442 Atrioventricular block, complete: Secondary | ICD-10-CM

## 2013-07-29 LAB — MDC_IDC_ENUM_SESS_TYPE_REMOTE
Battery Impedance: 1328 Ohm
Battery Remaining Longevity: 38 mo
Battery Voltage: 2.75 V
Date Time Interrogation Session: 20150427123047
Lead Channel Impedance Value: 473 Ohm
Lead Channel Pacing Threshold Pulse Width: 0.4 ms
Lead Channel Setting Pacing Amplitude: 2 V
Lead Channel Setting Pacing Amplitude: 2.5 V
Lead Channel Setting Sensing Sensitivity: 5.6 mV
MDC IDC MSMT LEADCHNL RA PACING THRESHOLD AMPLITUDE: 0.375 V
MDC IDC MSMT LEADCHNL RA PACING THRESHOLD PULSEWIDTH: 0.4 ms
MDC IDC MSMT LEADCHNL RA SENSING INTR AMPL: 2.8 mV
MDC IDC MSMT LEADCHNL RV IMPEDANCE VALUE: 529 Ohm
MDC IDC MSMT LEADCHNL RV PACING THRESHOLD AMPLITUDE: 0.75 V
MDC IDC MSMT LEADCHNL RV SENSING INTR AMPL: 22.4 mV
MDC IDC SET LEADCHNL RV PACING PULSEWIDTH: 0.4 ms
MDC IDC STAT BRADY AP VP PERCENT: 0 %
MDC IDC STAT BRADY AP VS PERCENT: 14 %
MDC IDC STAT BRADY AS VP PERCENT: 0 %
MDC IDC STAT BRADY AS VS PERCENT: 86 %

## 2013-08-22 ENCOUNTER — Encounter: Payer: Self-pay | Admitting: Cardiology

## 2013-09-19 ENCOUNTER — Encounter: Payer: Self-pay | Admitting: Internal Medicine

## 2013-09-23 ENCOUNTER — Other Ambulatory Visit: Payer: Self-pay | Admitting: Internal Medicine

## 2013-10-15 ENCOUNTER — Encounter: Payer: Self-pay | Admitting: Cardiology

## 2013-10-15 ENCOUNTER — Ambulatory Visit (INDEPENDENT_AMBULATORY_CARE_PROVIDER_SITE_OTHER): Payer: Medicare Other | Admitting: Cardiology

## 2013-10-15 VITALS — BP 134/74 | HR 82 | Ht 65.0 in | Wt 128.5 lb

## 2013-10-15 DIAGNOSIS — I10 Essential (primary) hypertension: Secondary | ICD-10-CM

## 2013-10-15 DIAGNOSIS — E785 Hyperlipidemia, unspecified: Secondary | ICD-10-CM | POA: Diagnosis not present

## 2013-10-15 DIAGNOSIS — I4891 Unspecified atrial fibrillation: Secondary | ICD-10-CM

## 2013-10-15 DIAGNOSIS — Z950 Presence of cardiac pacemaker: Secondary | ICD-10-CM

## 2013-10-15 DIAGNOSIS — I441 Atrioventricular block, second degree: Secondary | ICD-10-CM

## 2013-10-15 DIAGNOSIS — I480 Paroxysmal atrial fibrillation: Secondary | ICD-10-CM

## 2013-10-15 LAB — BASIC METABOLIC PANEL
BUN: 19 mg/dL (ref 6–23)
CHLORIDE: 101 meq/L (ref 96–112)
CO2: 31 mEq/L (ref 19–32)
Calcium: 9.9 mg/dL (ref 8.4–10.5)
Creat: 0.61 mg/dL (ref 0.50–1.10)
GLUCOSE: 91 mg/dL (ref 70–99)
POTASSIUM: 5 meq/L (ref 3.5–5.3)
Sodium: 138 mEq/L (ref 135–145)

## 2013-10-15 NOTE — Progress Notes (Signed)
[name] Date of Birth: Nov 17, 1926 Medical Record #[id number]  History of Present Illness: [name] is seen for followup. She has a history of symptomatic AV block and is status post pacemaker implant. On pacemaker followup she has been found to have paroxysmal atrial fibrillation. These episodes have been asymptomatic.  She denies any chest pain. She has no shortness of breath. No dizziness or syncope. She is on anticoagulation with Eliquis.   Current Outpatient Prescriptions on File Prior to Visit  Medication Sig Dispense Refill  . apixaban (ELIQUIS) 2.5 MG TABS tablet Take 1 tablet (2.5 mg total) by mouth 2 (two) times daily.  180 tablet  3  . atorvastatin (LIPITOR) 20 MG tablet TAKE 1 TABLET BY MOUTH DAILY  90 tablet  2  . Calcium Carbonate-Vitamin D (CALCIUM 600/VITAMIN D PO) Take 1 capsule by mouth daily.      . multivitamin (THERAGRAN) per tablet Take 1 tablet by mouth daily.        . Omega-3 Fatty Acids (FISH OIL) 1200 MG CAPS Take by mouth. Take one tablet daily       . ramipril (ALTACE) 5 MG capsule TAKE ONE CAPSULE EVERY DAY  90 capsule  0   No current facility-administered medications on file prior to visit.    Allergies  Allergen Reactions  . Anesthetics, Ester     Anesthetics not particularly Ester    Past Medical History  Diagnosis Date  . Hypertension   . Hyperlipidemia   . Pacemaker     permanent for mobitz II 2nd degree AV block (MDT)  . History of colonic polyps   . Osteopenia   . Paroxysmal atrial fibrillation   . Syncope 11/2005    ECHO ao sclerosis (observe per cards)  . Skin cancer 2010    Left leg, SCC, sees derm  . Cataract     REMOVED    Past Surgical History  Procedure Laterality Date  . Ppm      For mobitz II second degree AV block 2008  . Hemorrhoid surgery    . Total abdominal hysterectomy    . Appendectomy    . Breast biopsy Bilateral     several  . Insert / replace / remove pacemaker    . US echocardiography  12/08/2005     EF 55-60%  . Cataracts Bilateral   . Colonoscopy w/ biopsies    . Colonoscopy      History  Smoking status  . Former Smoker  . Types: Cigarettes  . Quit date: 08/23/1988  Smokeless tobacco  . Never Used    Comment: >20 yrs ago, used to smoke ~ 1 ppd    History  Alcohol Use  . Yes    Comment: socially    Family History  Problem Relation Age of Onset  . Colon cancer Neg Hx   . Diabetes Neg Hx   . Breast cancer Sister   . Hypertension Sister   . Coronary artery disease      F, brothers x 2other family members  . Heart failure Mother   . Prostate cancer Brother   . Heart disease Father     Review of Systems: As noted in history of present illness.  All other systems were reviewed and are negative.  Physical Exam: BP 134/74  Pulse 82  Ht 5\' 5"  (1.651 m)  Wt 128 lb 8 oz (58.287 kg)  BMI 21.38 kg/m2 She is very pleasant and in no distress. The HEENT exam is  normal. The carotids are 2+ without bruits.  There is no thyromegaly.  There is no JVD.  The lungs are clear.  Her pacemaker site is normal. The heart exam reveals a regular rate with a normal S1 and S2.  There are no murmurs, gallops, or rubs.  The PMI is not displaced.     There is no hepatosplenomegaly or tenderness.  There are no masses.  Exam of the legs reveal no clubbing, cyanosis, or edema.    The distal pulses are intact.  Cranial nerves II - XII are intact.  Motor and sensory functions are intact.  The gait is normal.  LABORATORY DATA: Lab Results  Component Value Date   WBC 6.1 09/07/2012   HGB 13.7 09/07/2012   HCT 40.9 09/07/2012   PLT 230.0 09/07/2012   GLUCOSE 84 09/07/2012   CHOL 206* 09/07/2012   TRIG 50.0 09/07/2012   HDL 89.30 09/07/2012   LDLDIRECT 105.8 09/07/2012   LDLCALC 83 10/31/2011   ALT 22 09/07/2012   AST 30 09/07/2012   NA 140 09/07/2012   K 4.2 09/07/2012   CL 104 09/07/2012   CREATININE 0.7 09/07/2012   BUN 19 09/07/2012   CO2 24 09/07/2012   TSH 2.12 09/07/2012   Pacemaker interrogation April 27 showed  18 mode switches all lasting less than 1 minute. Estimated longevity 3 years.  Assessment / Plan: 1. Paroxysmal atrial fibrillation. Patient is asymptomatic.  Continue on Apixaban 2.5 mg twice a day due to age. Will check BMET today.  2. Mobitz type 2 second degree AV block status post pacemaker implant. Keep follow up in pacemaker clinic.  3. Hypertension, controlled.  4. Hyperlipidemia.

## 2013-10-15 NOTE — Patient Instructions (Signed)
We will check your kidney function today  Otherwise continue your current therapy  I will see you in 6 months.

## 2013-10-31 ENCOUNTER — Ambulatory Visit (INDEPENDENT_AMBULATORY_CARE_PROVIDER_SITE_OTHER): Payer: Medicare Other | Admitting: *Deleted

## 2013-10-31 ENCOUNTER — Encounter: Payer: Self-pay | Admitting: Internal Medicine

## 2013-10-31 DIAGNOSIS — I441 Atrioventricular block, second degree: Secondary | ICD-10-CM | POA: Diagnosis not present

## 2013-10-31 DIAGNOSIS — Z950 Presence of cardiac pacemaker: Secondary | ICD-10-CM | POA: Diagnosis not present

## 2013-11-06 LAB — MDC_IDC_ENUM_SESS_TYPE_REMOTE
Battery Impedance: 1495 Ohm
Battery Remaining Longevity: 35 mo
Brady Statistic AP VP Percent: 0 %
Brady Statistic AS VP Percent: 0 %
Lead Channel Impedance Value: 480 Ohm
Lead Channel Impedance Value: 495 Ohm
Lead Channel Pacing Threshold Amplitude: 0.375 V
Lead Channel Sensing Intrinsic Amplitude: 2.8 mV
Lead Channel Setting Pacing Amplitude: 2 V
MDC IDC MSMT BATTERY VOLTAGE: 2.75 V
MDC IDC MSMT LEADCHNL RA PACING THRESHOLD PULSEWIDTH: 0.4 ms
MDC IDC MSMT LEADCHNL RV PACING THRESHOLD AMPLITUDE: 0.75 V
MDC IDC MSMT LEADCHNL RV PACING THRESHOLD PULSEWIDTH: 0.4 ms
MDC IDC MSMT LEADCHNL RV SENSING INTR AMPL: 11.2 mV
MDC IDC SESS DTM: 20150730131216
MDC IDC SET LEADCHNL RV PACING AMPLITUDE: 2.5 V
MDC IDC SET LEADCHNL RV PACING PULSEWIDTH: 0.4 ms
MDC IDC SET LEADCHNL RV SENSING SENSITIVITY: 5.6 mV
MDC IDC STAT BRADY AP VS PERCENT: 13 %
MDC IDC STAT BRADY AS VS PERCENT: 87 %

## 2013-11-08 DIAGNOSIS — Z961 Presence of intraocular lens: Secondary | ICD-10-CM | POA: Diagnosis not present

## 2013-11-08 DIAGNOSIS — H04129 Dry eye syndrome of unspecified lacrimal gland: Secondary | ICD-10-CM | POA: Diagnosis not present

## 2013-11-08 DIAGNOSIS — H35039 Hypertensive retinopathy, unspecified eye: Secondary | ICD-10-CM | POA: Diagnosis not present

## 2013-11-08 DIAGNOSIS — H26499 Other secondary cataract, unspecified eye: Secondary | ICD-10-CM | POA: Diagnosis not present

## 2013-11-14 ENCOUNTER — Encounter: Payer: Self-pay | Admitting: Cardiology

## 2013-12-05 DIAGNOSIS — D485 Neoplasm of uncertain behavior of skin: Secondary | ICD-10-CM | POA: Diagnosis not present

## 2013-12-05 DIAGNOSIS — D235 Other benign neoplasm of skin of trunk: Secondary | ICD-10-CM | POA: Diagnosis not present

## 2013-12-05 DIAGNOSIS — L819 Disorder of pigmentation, unspecified: Secondary | ICD-10-CM | POA: Diagnosis not present

## 2013-12-05 DIAGNOSIS — L570 Actinic keratosis: Secondary | ICD-10-CM | POA: Diagnosis not present

## 2013-12-18 DIAGNOSIS — Z23 Encounter for immunization: Secondary | ICD-10-CM | POA: Diagnosis not present

## 2013-12-19 ENCOUNTER — Encounter: Payer: Self-pay | Admitting: Internal Medicine

## 2013-12-19 ENCOUNTER — Ambulatory Visit (INDEPENDENT_AMBULATORY_CARE_PROVIDER_SITE_OTHER): Payer: Medicare Other | Admitting: Internal Medicine

## 2013-12-19 VITALS — BP 128/64 | HR 85 | Temp 98.2°F | Ht 65.0 in | Wt 125.1 lb

## 2013-12-19 DIAGNOSIS — H6120 Impacted cerumen, unspecified ear: Secondary | ICD-10-CM

## 2013-12-19 DIAGNOSIS — H6121 Impacted cerumen, right ear: Secondary | ICD-10-CM

## 2013-12-19 DIAGNOSIS — E785 Hyperlipidemia, unspecified: Secondary | ICD-10-CM | POA: Diagnosis not present

## 2013-12-19 DIAGNOSIS — I10 Essential (primary) hypertension: Secondary | ICD-10-CM

## 2013-12-19 DIAGNOSIS — Z0000 Encounter for general adult medical examination without abnormal findings: Secondary | ICD-10-CM | POA: Diagnosis not present

## 2013-12-19 DIAGNOSIS — I4891 Unspecified atrial fibrillation: Secondary | ICD-10-CM

## 2013-12-19 DIAGNOSIS — Z23 Encounter for immunization: Secondary | ICD-10-CM | POA: Diagnosis not present

## 2013-12-19 DIAGNOSIS — I480 Paroxysmal atrial fibrillation: Secondary | ICD-10-CM

## 2013-12-19 LAB — CBC WITH DIFFERENTIAL/PLATELET
BASOS PCT: 0.4 % (ref 0.0–3.0)
Basophils Absolute: 0 10*3/uL (ref 0.0–0.1)
Eosinophils Absolute: 0.1 10*3/uL (ref 0.0–0.7)
Eosinophils Relative: 1 % (ref 0.0–5.0)
HCT: 40.4 % (ref 36.0–46.0)
HEMOGLOBIN: 13.5 g/dL (ref 12.0–15.0)
LYMPHS ABS: 1.5 10*3/uL (ref 0.7–4.0)
LYMPHS PCT: 23.5 % (ref 12.0–46.0)
MCHC: 33.4 g/dL (ref 30.0–36.0)
MCV: 85.9 fl (ref 78.0–100.0)
MONOS PCT: 10.8 % (ref 3.0–12.0)
Monocytes Absolute: 0.7 10*3/uL (ref 0.1–1.0)
Neutro Abs: 4 10*3/uL (ref 1.4–7.7)
Neutrophils Relative %: 64.3 % (ref 43.0–77.0)
Platelets: 230 10*3/uL (ref 150.0–400.0)
RBC: 4.7 Mil/uL (ref 3.87–5.11)
RDW: 14 % (ref 11.5–15.5)
WBC: 6.3 10*3/uL (ref 4.0–10.5)

## 2013-12-19 LAB — LIPID PANEL
CHOL/HDL RATIO: 2
Cholesterol: 194 mg/dL (ref 0–200)
HDL: 80.5 mg/dL (ref >39.00)
LDL Cholesterol: 101 mg/dL — ABNORMAL HIGH (ref 0–99)
NONHDL: 113.5
Triglycerides: 65 mg/dL (ref 0.0–149.0)
VLDL: 13 mg/dL (ref 0.0–40.0)

## 2013-12-19 LAB — AST: AST: 31 U/L (ref 0–37)

## 2013-12-19 LAB — ALT: ALT: 23 U/L (ref 0–35)

## 2013-12-19 NOTE — Assessment & Plan Note (Addendum)
Td , next 2016 Had a shingles shot 2011  Pneumonia shot 2005 prevnar-- today Had a flu shot yesterday  no further screening for cervical ca h/o abnormal MMG and breast Bx; multiple (-) MMGs , last 05-2013 (-) SBE neg   Last Cscope 8-09, 2 polyps,  Cscope 8-14, + polyps, no further scopes  Continue with her healthy lifestyle Pulsatile mass in the epigastric area--- abd u/s 2013 (-) for AAA

## 2013-12-19 NOTE — Assessment & Plan Note (Signed)
Well-controlled, recent BMP normal

## 2013-12-19 NOTE — Assessment & Plan Note (Signed)
Good compliance w/ medication, check FLP, AST, ALT

## 2013-12-19 NOTE — Progress Notes (Signed)
Pre visit review using our clinic review tool, if applicable. No additional management support is needed unless otherwise documented below in the visit note. 

## 2013-12-19 NOTE — Assessment & Plan Note (Signed)
Closely follow up by cardiology, asymptomatic

## 2013-12-19 NOTE — Progress Notes (Signed)
Subjective:    Patient ID: Peggy Mendez, female    DOB: 1926-05-21, 80 y.o.   MRN: [id number]  DOS:  12/19/2013 Type of visit - description :   Here for Medicare AWV:  1. Risk factors based on Past M, S, F history: reviewed  2. Physical Activities: Very active, walking slt less than last year due to DJD  3. Depression/mood: (-) screening  4. Hearing: No problems noted or reported  5. ADL's: independent,  drives  6. Fall Risk: no recent falls, prevention discussed  7. Home Safety: does feel safe at home  8. Height, weight, &visual acuity: See VS, vision corrected w/ glasses , s/p cataracts surgery  9. Counseling: provided   10. Labs ordered based on risk factors: yes  11. Referral Coordination:if needed  12. Care Plan, see a/p  13. Cognitive Assessment: motor and cognition seem appropiate for age   In addition we discussed the following:  Atrial fibrillation, good compliance with eliquis, sees cards regulalrly High cholesterol, on Lipitor, no apparent side effects High blood pressure, good compliance with medications, ambulatory BPs normal, never more than 140. Occasional right ear pain on and off, denies any discharge from the ear or increased pain with chewing   ROS No  CP, SOB No palpitations  Denies  nausea, vomiting diarrhea, blood in the stools No abdominal pain (-) cough, sputum production (-) wheezing, chest congestion  No dysuria, gross hematuria, difficulty urinating  No vaginal discharge,bleed.     Past Medical History  Diagnosis Date  . Hypertension   . Hyperlipidemia   . Pacemaker     permanent for mobitz II 2nd degree AV block (MDT)  . History of colonic polyps   . Osteopenia   . Paroxysmal atrial fibrillation   . Syncope 11/2005    ECHO ao sclerosis (observe per cards)  . Skin cancer 2010    Left leg, SCC, sees derm  . Cataract     REMOVED    Past Surgical History  Procedure Laterality Date  . Ppm      For mobitz II second degree AV block  2008  . Hemorrhoid surgery    . Total abdominal hysterectomy      oophorectomy  . Appendectomy    . Breast biopsy Bilateral     several  . Insert / replace / remove pacemaker    . US echocardiography  12/08/2005    EF 55-60%  . Cataracts Bilateral   . Colonoscopy w/ biopsies      History   Social History  . Marital Status: Single    Spouse Name: N/A    Number of Children: 0  . Years of Education: N/A   Occupational History  . retired     Social History Main Topics  . Smoking status: Former Smoker    Types: Cigarettes    Quit date: 08/23/1988  . Smokeless tobacco: Never Used     Comment: >20 yrs ago, used to smoke ~ 1 ppd  . Alcohol Use: Yes     Comment: socially  . Drug Use: No  . Sexual Activity: Not on file   Other Topics Concern  . Not on file   Social History Narrative   Lives by herself,  still drives   Has a twin sister in [place], a nephew in [place].                      Family History  Problem Relation Age of  Onset  . Colon cancer Neg Hx   . Diabetes Neg Hx   . Breast cancer Sister   . Hypertension Sister   . Coronary artery disease Other     F, brothers x 2other family members  . Heart failure Mother   . Prostate cancer Brother   . Heart disease Father          Medication List       This list is accurate as of: 12/19/13  4:36 PM.  Always use your most recent med list.               apixaban 2.5 MG Tabs tablet  Commonly known as:  ELIQUIS  Take 1 tablet (2.5 mg total) by mouth 2 (two) times daily.     atorvastatin 20 MG tablet  Commonly known as:  LIPITOR  TAKE 1 TABLET BY MOUTH DAILY     CALCIUM 600/VITAMIN D PO  Take 1 capsule by mouth daily.     Fish Oil 1200 MG Caps  Take by mouth. Take one tablet daily     multivitamin per tablet  Take 1 tablet by mouth daily.     ramipril 5 MG capsule  Commonly known as:  ALTACE  TAKE ONE CAPSULE EVERY DAY           Objective:   Physical Exam BP 128/64  Pulse 85   Temp(Src) 98.2 F (36.8 C) (Oral)  Ht 5\' 5"  (1.651 m)  Wt 125 lb 2 oz (56.756 kg)  BMI 20.82 kg/m2  SpO2 95% General -- alert, well-developed, NAD.  Neck --no thyromegaly , normal carotid pulse  HEENT-- Not pale.  TMs L normal,R obscured by hard wax Lungs -- normal respiratory effort, no intercostal retractions, no accessory muscle use, and normal breath sounds.  Heart-- normal rate, regular rhythm, no murmur.  Abdomen-- Not distended, good bowel sounds,soft, non-tender. Palpable, no tender Ao upper abd, no bruit Extremities-- no pretibial edema bilaterally  Neurologic--  alert & oriented X3. Speech normal, gait appropriate for age, strength symmetric and appropriate for age.  Psych-- Cognition and judgment appear intact. Cooperative with normal attention span and concentration. No anxious or depressed appearing.        Assessment & Plan:   Right ear discomfort, on exam she has cerumen impaction. Lavage and several tries w/ a spoon were unsuccessful Refer to Dr Redmond Baseman

## 2013-12-19 NOTE — Patient Instructions (Signed)
Get your blood work before you leave     Please come back to the office in year for a physical exam. Come back fasting    Stop by the front desk and schedule the visit       Fall Prevention and Home Safety Falls cause injuries and can affect all age groups. It is possible to use preventive measures to significantly decrease the likelihood of falls. There are many simple measures which can make your home safer and prevent falls. OUTDOORS  Repair cracks and edges of walkways and driveways.  Remove high doorway thresholds.  Trim shrubbery on the main path into your home.  Have good outside lighting.  Clear walkways of tools, rocks, debris, and clutter.  Check that handrails are not broken and are securely fastened. Both sides of steps should have handrails.  Have leaves, snow, and ice cleared regularly.  Use sand or salt on walkways during winter months.  In the garage, clean up grease or oil spills. BATHROOM  Install night lights.  Install grab bars by the toilet and in the tub and shower.  Use non-skid mats or decals in the tub or shower.  Place a plastic non-slip stool in the shower to sit on, if needed.  Keep floors dry and clean up all water on the floor immediately.  Remove soap buildup in the tub or shower on a regular basis.  Secure bath mats with non-slip, double-sided rug tape.  Remove throw rugs and tripping hazards from the floors. BEDROOMS  Install night lights.  Make sure a bedside light is easy to reach.  Do not use oversized bedding.  Keep a telephone by your bedside.  Have a firm chair with side arms to use for getting dressed.  Remove throw rugs and tripping hazards from the floor. KITCHEN  Keep handles on pots and pans turned toward the center of the stove. Use back burners when possible.  Clean up spills quickly and allow time for drying.  Avoid walking on wet floors.  Avoid hot utensils and knives.  Position shelves so they are  not too high or low.  Place commonly used objects within easy reach.  If necessary, use a sturdy step stool with a grab bar when reaching.  Keep electrical cables out of the way.  Do not use floor polish or wax that makes floors slippery. If you must use wax, use non-skid floor wax.  Remove throw rugs and tripping hazards from the floor. STAIRWAYS  Never leave objects on stairs.  Place handrails on both sides of stairways and use them. Fix any loose handrails. Make sure handrails on both sides of the stairways are as long as the stairs.  Check carpeting to make sure it is firmly attached along stairs. Make repairs to worn or loose carpet promptly.  Avoid placing throw rugs at the top or bottom of stairways, or properly secure the rug with carpet tape to prevent slippage. Get rid of throw rugs, if possible.  Have an electrician put in a light switch at the top and bottom of the stairs. OTHER FALL PREVENTION TIPS  Wear low-heel or rubber-soled shoes that are supportive and fit well. Wear closed toe shoes.  When using a stepladder, make sure it is fully opened and both spreaders are firmly locked. Do not climb a closed stepladder.  Add color or contrast paint or tape to grab bars and handrails in your home. Place contrasting color strips on first and last steps.  Learn and  use mobility aids as needed. Install an electrical emergency response system.  Turn on lights to avoid dark areas. Replace light bulbs that burn out immediately. Get light switches that glow.  Arrange furniture to create clear pathways. Keep furniture in the same place.  Firmly attach carpet with non-skid or double-sided tape.  Eliminate uneven floor surfaces.  Select a carpet pattern that does not visually hide the edge of steps.  Be aware of all pets. OTHER HOME SAFETY TIPS  Set the water temperature for 120 F (48.8 C).  Keep emergency numbers on or near the telephone.  Keep smoke detectors on  every level of the home and near sleeping areas. Document Released: 03/11/2002 Document Revised: 09/20/2011 Document Reviewed: 06/10/2011 [facility] Patient Information 2015 [place], [place]. This information is not intended to replace advice given to you by your health care provider. Make sure you discuss any questions you have with your health care provider.

## 2013-12-20 ENCOUNTER — Other Ambulatory Visit: Payer: Self-pay | Admitting: Internal Medicine

## 2014-01-27 ENCOUNTER — Other Ambulatory Visit: Payer: Self-pay

## 2014-01-27 DIAGNOSIS — E785 Hyperlipidemia, unspecified: Secondary | ICD-10-CM

## 2014-01-27 MED ORDER — ATORVASTATIN CALCIUM 20 MG PO TABS: ORAL_TABLET | ORAL | Status: DC

## 2014-01-29 ENCOUNTER — Telehealth: Payer: Self-pay | Admitting: Cardiology

## 2014-01-29 NOTE — Telephone Encounter (Signed)
Please call,question about what pain meds she can take?She is on Moosic concerned. Please call before 11 this morning if possible,

## 2014-01-29 NOTE — Telephone Encounter (Signed)
Returned call to patient no answer.LMTC. 

## 2014-01-30 NOTE — Telephone Encounter (Signed)
Returning your call. °

## 2014-01-30 NOTE — Telephone Encounter (Signed)
Returned call to patient she stated she needed to know what pain medication she can take since she is on eliquis.Advised no aspirin.Advised ok to take tylenol.

## 2014-02-03 ENCOUNTER — Ambulatory Visit (INDEPENDENT_AMBULATORY_CARE_PROVIDER_SITE_OTHER): Payer: Medicare Other | Admitting: *Deleted

## 2014-02-03 ENCOUNTER — Encounter: Payer: Self-pay | Admitting: Internal Medicine

## 2014-02-03 DIAGNOSIS — I441 Atrioventricular block, second degree: Secondary | ICD-10-CM

## 2014-02-03 NOTE — Progress Notes (Signed)
Remote pacemaker transmission.   

## 2014-02-04 LAB — MDC_IDC_ENUM_SESS_TYPE_REMOTE
Battery Remaining Longevity: 34 mo
Battery Voltage: 2.74 V
Brady Statistic AP VS Percent: 14 %
Brady Statistic AS VP Percent: 0 %
Lead Channel Impedance Value: 565 Ohm
Lead Channel Pacing Threshold Amplitude: 0.5 V
Lead Channel Pacing Threshold Amplitude: 0.875 V
Lead Channel Pacing Threshold Pulse Width: 0.4 ms
Lead Channel Sensing Intrinsic Amplitude: 11.2 mV
Lead Channel Setting Pacing Amplitude: 2 V
Lead Channel Setting Pacing Amplitude: 2.5 V
Lead Channel Setting Pacing Pulse Width: 0.4 ms
Lead Channel Setting Sensing Sensitivity: 5.6 mV
MDC IDC MSMT BATTERY IMPEDANCE: 1556 Ohm
MDC IDC MSMT LEADCHNL RA IMPEDANCE VALUE: 460 Ohm
MDC IDC MSMT LEADCHNL RA PACING THRESHOLD PULSEWIDTH: 0.4 ms
MDC IDC MSMT LEADCHNL RA SENSING INTR AMPL: 2.8 mV
MDC IDC SESS DTM: 20151102155124
MDC IDC STAT BRADY AP VP PERCENT: 0 %
MDC IDC STAT BRADY AS VS PERCENT: 86 %

## 2014-02-19 ENCOUNTER — Encounter: Payer: Self-pay | Admitting: Cardiology

## 2014-03-18 ENCOUNTER — Telehealth: Payer: Self-pay | Admitting: Internal Medicine

## 2014-03-18 NOTE — Telephone Encounter (Signed)
New Message  Pt had last Remote check in Nov/15 has next Remote sched Feb/16; has a recall date for Dr. Rayann Heman in 06/24/14, but 12 months from last [facility]red check should be Jan/16. Pt requests to speak with someone so that she can see Dr. Rayann Heman.in February. Please call back and discuss.

## 2014-03-19 ENCOUNTER — Telehealth: Payer: Self-pay | Admitting: Cardiology

## 2014-03-19 NOTE — Telephone Encounter (Signed)
Pt called and stated that she wanted to cancel the May 07, 2014 remote pacemaker check b/c she is due for her yearly follow up with Dr. Rayann Heman. Pt will continue remote transmission 3 months after her February appt w/ Dr. Rayann Heman.

## 2014-04-09 ENCOUNTER — Other Ambulatory Visit: Payer: Self-pay | Admitting: Internal Medicine

## 2014-04-09 DIAGNOSIS — Z1231 Encounter for screening mammogram for malignant neoplasm of breast: Secondary | ICD-10-CM

## 2014-04-11 ENCOUNTER — Other Ambulatory Visit: Payer: Self-pay | Admitting: *Deleted

## 2014-04-11 DIAGNOSIS — I480 Paroxysmal atrial fibrillation: Secondary | ICD-10-CM

## 2014-04-11 MED ORDER — APIXABAN 2.5 MG PO TABS: 2.5000 mg | ORAL_TABLET | Freq: Two times a day (BID) | ORAL | Status: DC

## 2014-04-17 ENCOUNTER — Encounter: Payer: Self-pay | Admitting: Cardiology

## 2014-04-17 ENCOUNTER — Ambulatory Visit (INDEPENDENT_AMBULATORY_CARE_PROVIDER_SITE_OTHER): Payer: Medicare Other | Admitting: Cardiology

## 2014-04-17 VITALS — BP 114/68 | HR 79 | Ht 65.0 in | Wt 124.3 lb

## 2014-04-17 DIAGNOSIS — I441 Atrioventricular block, second degree: Secondary | ICD-10-CM

## 2014-04-17 DIAGNOSIS — I480 Paroxysmal atrial fibrillation: Secondary | ICD-10-CM

## 2014-04-17 DIAGNOSIS — I10 Essential (primary) hypertension: Secondary | ICD-10-CM

## 2014-04-17 NOTE — Patient Instructions (Signed)
Continue your current therapy  I will see you in 6 months.   

## 2014-04-17 NOTE — Progress Notes (Signed)
Peggy Mendez Date of Birth: [birth year] Medical Record #[id number]  History of Present Illness: Peggy Mendez is seen for followup of Pacemaker and atrial fibrillation. She has a history of symptomatic AV block and is status post pacemaker implant. On pacemaker followup she has been found to have paroxysmal atrial fibrillation. These episodes have been asymptomatic.  She denies any chest pain. She has no shortness of breath. No dizziness or syncope. She is on anticoagulation with Eliquis. Dose reduced for age and renal function. Her last pacemaker evaluation showed some Afib. Longest episode > 3 hours but burden less than 0.1%. No sustained ventricular high rates. She stays active. She does note some numbness in her arms at night bilaterally. She reports a history of spinal stenosis.  Current Outpatient Prescriptions on File Prior to Visit  Medication Sig Dispense Refill  . apixaban (ELIQUIS) 2.5 MG TABS tablet Take 1 tablet (2.5 mg total) by mouth 2 (two) times daily. 180 tablet 3  . atorvastatin (LIPITOR) 20 MG tablet TAKE 1 TABLET BY MOUTH DAILY 90 tablet 1  . Calcium Carbonate-Vitamin Peggy (CALCIUM 600/VITAMIN Peggy PO) Take 1 capsule by mouth daily.    . multivitamin (THERAGRAN) per tablet Take 1 tablet by mouth daily.      . Omega-3 Fatty Acids (FISH OIL) 1200 MG CAPS Take by mouth. Take one tablet daily     . ramipril (ALTACE) 5 MG capsule TAKE ONE CAPSULE EVERY DAY 90 capsule 1   No current facility-administered medications on file prior to visit.    Allergies  Allergen Reactions  . Anesthetics, Ester     Anesthetics not particularly Ester    Past Medical History  Diagnosis Date  . Hypertension   . Hyperlipidemia   . Pacemaker     permanent for mobitz II 2nd degree AV block (MDT)  . History of colonic polyps   . Osteopenia   . Paroxysmal atrial fibrillation   . Syncope 11/2005    ECHO ao sclerosis (observe per cards)  . Skin cancer 2010    Left leg, SCC, sees derm  .  Cataract     REMOVED    Past Surgical History  Procedure Laterality Date  . Ppm      For mobitz II second degree AV block 2008  . Hemorrhoid surgery    . Total abdominal hysterectomy      oophorectomy  . Appendectomy    . Breast biopsy Bilateral     several  . Insert / replace / remove pacemaker    . US echocardiography  12/08/2005    EF 55-60%  . Cataracts Bilateral   . Colonoscopy w/ biopsies      History  Smoking status  . Former Smoker  . Types: Cigarettes  . Quit date: 08/23/1988  Smokeless tobacco  . Never Used    Comment: >20 yrs ago, used to smoke ~ 1 ppd    History  Alcohol Use  . Yes    Comment: socially    Family History  Problem Relation Age of Onset  . Colon cancer Neg Hx   . Diabetes Neg Hx   . Breast cancer Sister   . Hypertension Sister   . Coronary artery disease Other     F, brothers x 2other family members  . Heart failure Mother   . Prostate cancer Brother   . Heart disease Father     Review of Systems: As noted in history of present illness.  All other systems were reviewed  and are negative.  Physical Exam: BP 114/68 mmHg  Pulse 79  Ht 5\' 5"  (1.651 m)  Wt 124 lb 4.8 oz (56.382 kg)  BMI 20.68 kg/m2 She is very pleasant and in no distress. The HEENT exam is normal. The carotids are 2+ without bruits.  There is no thyromegaly.  There is no JVD.  The lungs are clear.  Her pacemaker site is normal. The heart exam reveals a regular rate with a normal S1 and S2.  There are no murmurs, gallops, or rubs.  The PMI is not displaced.     There is no hepatosplenomegaly or tenderness.  There are no masses.  Exam of the legs reveal no clubbing, cyanosis, or edema.    The distal pulses are intact.  Cranial nerves II - XII are intact.  Motor and sensory functions are intact.  The gait is normal.  LABORATORY DATA: Lab Results  Component Value Date   WBC 6.3 12/19/2013   HGB 13.5 12/19/2013   HCT 40.4 12/19/2013   PLT 230.0 12/19/2013   GLUCOSE 91  10/15/2013   CHOL 194 12/19/2013   TRIG 65.0 12/19/2013   HDL 80.50 12/19/2013   LDLDIRECT 105.8 09/07/2012   LDLCALC 101* 12/19/2013   ALT 23 12/19/2013   AST 31 12/19/2013   NA 138 10/15/2013   K 5.0 10/15/2013   CL 101 10/15/2013   CREATININE 0.61 10/15/2013   BUN 19 10/15/2013   CO2 31 10/15/2013   TSH 2.12 09/07/2012   Ecg: today shows atrial pacing rate 79. Otherwise normal.   Assessment / Plan: 1. Paroxysmal atrial fibrillation. Patient is asymptomatic.  Continue on Apixaban 2.5 mg twice a day.  2. Mobitz type 2 second degree AV block status post pacemaker implant. Keep follow up in pacemaker clinic.  3. Hypertension, controlled.  4. Hyperlipidemia.

## 2014-05-09 ENCOUNTER — Ambulatory Visit (HOSPITAL_COMMUNITY)
Admission: RE | Admit: 2014-05-09 | Discharge: 2014-05-09 | Disposition: A | Discharge status: Home / Self Care | Payer: Medicare Other | Source: Ambulatory Visit | Attending: Internal Medicine | Admitting: Internal Medicine

## 2014-05-09 DIAGNOSIS — Z1231 Encounter for screening mammogram for malignant neoplasm of breast: Secondary | ICD-10-CM | POA: Insufficient documentation

## 2014-05-12 ENCOUNTER — Other Ambulatory Visit: Payer: Self-pay | Admitting: Internal Medicine

## 2014-05-12 DIAGNOSIS — R928 Other abnormal and inconclusive findings on diagnostic imaging of breast: Secondary | ICD-10-CM

## 2014-05-13 ENCOUNTER — Other Ambulatory Visit: Payer: Self-pay

## 2014-05-14 ENCOUNTER — Encounter: Payer: Self-pay | Admitting: Internal Medicine

## 2014-05-14 ENCOUNTER — Telehealth: Payer: Self-pay | Admitting: Cardiology

## 2014-05-14 ENCOUNTER — Ambulatory Visit
Admission: RE | Admit: 2014-05-14 | Discharge: 2014-05-14 | Disposition: A | Discharge status: Home / Self Care | Payer: Medicare Other | Source: Ambulatory Visit | Attending: Internal Medicine | Admitting: Internal Medicine

## 2014-05-14 ENCOUNTER — Other Ambulatory Visit: Payer: Self-pay | Admitting: Internal Medicine

## 2014-05-14 ENCOUNTER — Ambulatory Visit (INDEPENDENT_AMBULATORY_CARE_PROVIDER_SITE_OTHER): Payer: Medicare Other | Admitting: Internal Medicine

## 2014-05-14 VITALS — BP 118/62 | HR 82 | Ht 65.0 in | Wt 125.0 lb

## 2014-05-14 DIAGNOSIS — N6310 Unspecified lump in the right breast, unspecified quadrant: Secondary | ICD-10-CM

## 2014-05-14 DIAGNOSIS — I480 Paroxysmal atrial fibrillation: Secondary | ICD-10-CM | POA: Diagnosis not present

## 2014-05-14 DIAGNOSIS — I441 Atrioventricular block, second degree: Secondary | ICD-10-CM

## 2014-05-14 DIAGNOSIS — R928 Other abnormal and inconclusive findings on diagnostic imaging of breast: Secondary | ICD-10-CM

## 2014-05-14 DIAGNOSIS — N6011 Diffuse cystic mastopathy of right breast: Secondary | ICD-10-CM | POA: Diagnosis not present

## 2014-05-14 LAB — MDC_IDC_ENUM_SESS_TYPE_INCLINIC
Battery Remaining Longevity: 32 mo
Brady Statistic AP VP Percent: 0 %
Brady Statistic AS VP Percent: 0 %
Brady Statistic AS VS Percent: 85 %
Date Time Interrogation Session: 20160210112449
Lead Channel Impedance Value: 441 Ohm
Lead Channel Impedance Value: 552 Ohm
Lead Channel Pacing Threshold Amplitude: 0.75 V
Lead Channel Pacing Threshold Pulse Width: 0.4 ms
Lead Channel Pacing Threshold Pulse Width: 0.4 ms
Lead Channel Sensing Intrinsic Amplitude: 15.67 mV
Lead Channel Sensing Intrinsic Amplitude: 4 mV
Lead Channel Setting Pacing Amplitude: 2.5 V
MDC IDC MSMT BATTERY IMPEDANCE: 1639 Ohm
MDC IDC MSMT BATTERY VOLTAGE: 2.74 V
MDC IDC MSMT LEADCHNL RA PACING THRESHOLD AMPLITUDE: 0.5 V
MDC IDC SET LEADCHNL RA PACING AMPLITUDE: 2 V
MDC IDC SET LEADCHNL RV PACING PULSEWIDTH: 0.4 ms
MDC IDC SET LEADCHNL RV SENSING SENSITIVITY: 5.6 mV
MDC IDC STAT BRADY AP VS PERCENT: 15 %

## 2014-05-14 NOTE — Patient Instructions (Signed)
Your physician wants you to follow-up in: 12 months with Amber Seiler, NP You will receive a reminder letter in the mail two months in advance. If you don't receive a letter, please call our office to schedule the follow-up appointment.  Remote monitoring is used to monitor your Pacemaker or ICD from home. This monitoring reduces the number of office visits required to check your device to one time per year. It allows us to keep an eye on the functioning of your device to ensure it is working properly. You are scheduled for a device check from home on 08/13/14. You may send your transmission at any time that day. If you have a wireless device, the transmission will be sent automatically. After your physician reviews your transmission, you will receive a postcard with your next transmission date.    

## 2014-05-14 NOTE — Progress Notes (Signed)
PCP: Kathlene November, MD Primary Cardiologist:  Dr Martinique  Peggy Mendez is a 81 y.o. female who presents today for routine electrophysiology followup.  Since last being seen in our clinic, the patient reports doing very well.  She remains active for her age.  She is unaware of any symptoms of afib.  Her blood pressure is controlled.  She has had no bleeding on eliquis.  Today, she denies symptoms of palpitations, chest pain, shortness of breath,  lower extremity edema, dizziness, presyncope, or syncope.  The patient is otherwise without complaint today.   Past Medical History  Diagnosis Date  . Hypertension   . Hyperlipidemia   . Pacemaker     permanent for mobitz II 2nd degree AV block (MDT)  . History of colonic polyps   . Osteopenia   . Paroxysmal atrial fibrillation   . Syncope 11/2005    ECHO ao sclerosis (observe per cards)  . Skin cancer 2010    Left leg, SCC, sees derm  . Cataract     REMOVED   Past Surgical History  Procedure Laterality Date  . Ppm      For mobitz II second degree AV block 2008  . Hemorrhoid surgery    . Total abdominal hysterectomy      oophorectomy  . Appendectomy    . Breast biopsy Bilateral     several  . Insert / replace / remove pacemaker    . US echocardiography  12/08/2005    EF 55-60%  . Cataracts Bilateral   . Colonoscopy w/ biopsies      Current Outpatient Prescriptions  Medication Sig Dispense Refill  . apixaban (ELIQUIS) 2.5 MG TABS tablet Take 1 tablet (2.5 mg total) by mouth 2 (two) times daily. 180 tablet 3  . atorvastatin (LIPITOR) 20 MG tablet TAKE 1 TABLET BY MOUTH DAILY 90 tablet 1  . Calcium Carbonate-Vitamin Peggy (CALCIUM 600/VITAMIN Peggy PO) Take 1 capsule by mouth daily.    . multivitamin (THERAGRAN) per tablet Take 1 tablet by mouth daily.      . Omega-3 Fatty Acids (FISH OIL) 1200 MG CAPS Take one tablet by mouth daily    . ramipril (ALTACE) 5 MG capsule TAKE ONE CAPSULE EVERY DAY (Patient taking differently: TAKE ONE CAPSULE BY  MOUTH EVERY DAY) 90 capsule 1   No current facility-administered medications for this visit.    Physical Exam: Filed Vitals:   05/14/14 1104  BP: 118/62  Pulse: 82  Height: 5\' 5"  (1.651 m)  Weight: 125 lb (56.7 kg)    GEN- The patient is well appearing, alert and oriented x 3 today.   Head- normocephalic, atraumatic Eyes-  Sclera clear, conjunctiva pink Ears- hearing intact Oropharynx- clear Lungs- Clear to ausculation bilaterally, normal work of breathing Heart- Regular rate and rhythm, no murmurs, rubs or gallops, PMI not laterally displaced GI- soft, NT, ND, + BS Extremities- no clubbing, cyanosis, or edema   Assessment and Plan:   AV block, 2nd degre  Normal pacemaker function  See Pace Art report  No changes today   PAROXYSMAL ATRIAL FIBRILLATION  Controlled (<.1%) Appropriately anticoagulated with eliquis 2.5mg  BID chads2vasc score is at least 4  carelink  Return to see Chanetta Marshall NP in 1 year Dr Martinique to see as scheduled

## 2014-05-14 NOTE — Telephone Encounter (Signed)
Pt is going to have a biopsy. She wants to know about stopping her medicine.

## 2014-05-14 NOTE — Telephone Encounter (Signed)
Returned call to patient she stated she is going to have a breast biopsy.Dr.Jordan advised ok to hold eliquis 48 hours prior to biopsy.

## 2014-05-27 ENCOUNTER — Ambulatory Visit
Admission: RE | Admit: 2014-05-27 | Discharge: 2014-05-27 | Disposition: A | Discharge status: Home / Self Care | Payer: Medicare Other | Source: Ambulatory Visit | Attending: Internal Medicine | Admitting: Internal Medicine

## 2014-05-27 ENCOUNTER — Other Ambulatory Visit: Payer: Self-pay | Admitting: Internal Medicine

## 2014-05-27 DIAGNOSIS — N63 Unspecified lump in breast: Secondary | ICD-10-CM | POA: Diagnosis not present

## 2014-05-27 DIAGNOSIS — N6310 Unspecified lump in the right breast, unspecified quadrant: Secondary | ICD-10-CM

## 2014-05-27 DIAGNOSIS — N6001 Solitary cyst of right breast: Secondary | ICD-10-CM | POA: Diagnosis not present

## 2014-06-15 ENCOUNTER — Other Ambulatory Visit: Payer: Self-pay | Admitting: Internal Medicine

## 2014-07-30 ENCOUNTER — Other Ambulatory Visit: Payer: Self-pay | Admitting: Internal Medicine

## 2014-08-13 ENCOUNTER — Ambulatory Visit (INDEPENDENT_AMBULATORY_CARE_PROVIDER_SITE_OTHER): Payer: Medicare Other | Admitting: *Deleted

## 2014-08-13 ENCOUNTER — Encounter: Payer: Self-pay | Admitting: Internal Medicine

## 2014-08-13 DIAGNOSIS — L814 Other melanin hyperpigmentation: Secondary | ICD-10-CM | POA: Diagnosis not present

## 2014-08-13 DIAGNOSIS — L821 Other seborrheic keratosis: Secondary | ICD-10-CM | POA: Diagnosis not present

## 2014-08-13 DIAGNOSIS — I441 Atrioventricular block, second degree: Secondary | ICD-10-CM

## 2014-08-13 DIAGNOSIS — B079 Viral wart, unspecified: Secondary | ICD-10-CM | POA: Diagnosis not present

## 2014-08-13 DIAGNOSIS — D485 Neoplasm of uncertain behavior of skin: Secondary | ICD-10-CM | POA: Diagnosis not present

## 2014-08-13 DIAGNOSIS — L570 Actinic keratosis: Secondary | ICD-10-CM | POA: Diagnosis not present

## 2014-08-13 DIAGNOSIS — D225 Melanocytic nevi of trunk: Secondary | ICD-10-CM | POA: Diagnosis not present

## 2014-08-13 NOTE — Progress Notes (Signed)
Remote pacemaker transmission.   

## 2014-08-14 LAB — CUP PACEART REMOTE DEVICE CHECK
Battery Impedance: 1849 Ohm
Battery Remaining Longevity: 28 mo
Battery Voltage: 2.73 V
Brady Statistic AP VS Percent: 17 %
Date Time Interrogation Session: 20160511105115
Lead Channel Impedance Value: 465 Ohm
Lead Channel Pacing Threshold Amplitude: 0.5 V
Lead Channel Pacing Threshold Pulse Width: 0.4 ms
Lead Channel Pacing Threshold Pulse Width: 0.4 ms
Lead Channel Setting Pacing Amplitude: 2 V
Lead Channel Setting Pacing Amplitude: 2.5 V
Lead Channel Setting Sensing Sensitivity: 5.6 mV
MDC IDC MSMT LEADCHNL RA SENSING INTR AMPL: 2.8 mV — AB
MDC IDC MSMT LEADCHNL RV IMPEDANCE VALUE: 498 Ohm
MDC IDC MSMT LEADCHNL RV PACING THRESHOLD AMPLITUDE: 0.75 V
MDC IDC MSMT LEADCHNL RV SENSING INTR AMPL: 11.2 mV
MDC IDC SET LEADCHNL RV PACING PULSEWIDTH: 0.4 ms
MDC IDC STAT BRADY AP VP PERCENT: 0 %
MDC IDC STAT BRADY AS VP PERCENT: 0 %
MDC IDC STAT BRADY AS VS PERCENT: 83 %

## 2014-08-27 ENCOUNTER — Encounter: Payer: Self-pay | Admitting: Cardiology

## 2014-09-11 ENCOUNTER — Telehealth: Payer: Self-pay | Admitting: Internal Medicine

## 2014-09-11 ENCOUNTER — Other Ambulatory Visit: Payer: Self-pay | Admitting: Internal Medicine

## 2014-09-11 MED ORDER — RAMIPRIL 5 MG PO CAPS: 5.0000 mg | ORAL_CAPSULE | Freq: Every day | ORAL | Status: DC

## 2014-09-11 NOTE — Telephone Encounter (Signed)
Patient was seen 12/19/2013 for a physical exam, was recommended to come back once a year. From my standpoint, it is okay to refill 90 days supply (except Eliquis --->  per cardiology)  Please call the patient and let her know, I apologize for the misunderstanding

## 2014-09-11 NOTE — Telephone Encounter (Signed)
Caller name: Ayannah Faddis Relationship to patient: self Can be reached: [phone] Pharmacy: [id number] - [place], [place]  Reason for call: Pt called in very frustrated and stated she wanted to file a complaint. She states that she is only due for yearly appointments with Dr. Larose Kells as she is also see 2 different heart doctors. She sees one of them once a year and the other twice a year. She states that she has asked before to have her medications sent in for a 90 day supply with 3 refills. She has had to call and call our office for refills on her medications. She just picked up RX for Ramapril and it was for 30 day supply and stated she needed to call and schedule appt. She said that a 30 day and 90 day prescription cost her the same amount and this is a waste of her money. She also states that multiple appts are unnecessary and also a waste of money. She is only to have yearly appts here. She said this is our last chance to get it right or she'll have meds filled by her heart doctor until she finds someone else. Pt states last physical was 12/19/2013 and she may or may not be back. Pt declined scheduling an appt at this time. She is requesting a call back from management and/or Dr. Larose Kells.

## 2014-09-11 NOTE — Telephone Encounter (Signed)
I apologize, I normally check previous AVS' for when Pt's are to return for next visit if appt is not scheduled. I have sent Ramipril for a year supply. I will try to contact Pt first thing in the morning on return to the office.

## 2014-09-12 NOTE — Telephone Encounter (Signed)
Called and spoke with Pt, informed her that it was my mistake that I must have misread his last OV note. Informed her that I have sent in a 90 day supply and 3 refills equaling 1 year. Pt verbalized understanding and thanked me for my help and understanding. I informed her we would see her around September unless she needed [place] sooner. Pt again expressed thanks.

## 2014-10-16 ENCOUNTER — Ambulatory Visit (INDEPENDENT_AMBULATORY_CARE_PROVIDER_SITE_OTHER): Payer: Medicare Other | Admitting: Cardiology

## 2014-10-16 ENCOUNTER — Encounter: Payer: Self-pay | Admitting: Cardiology

## 2014-10-16 VITALS — BP 128/80 | HR 80 | Ht 65.0 in | Wt 123.4 lb

## 2014-10-16 DIAGNOSIS — I441 Atrioventricular block, second degree: Secondary | ICD-10-CM

## 2014-10-16 DIAGNOSIS — Z950 Presence of cardiac pacemaker: Secondary | ICD-10-CM

## 2014-10-16 DIAGNOSIS — I480 Paroxysmal atrial fibrillation: Secondary | ICD-10-CM

## 2014-10-16 DIAGNOSIS — I10 Essential (primary) hypertension: Secondary | ICD-10-CM | POA: Diagnosis not present

## 2014-10-16 NOTE — Patient Instructions (Signed)
Continue your current therapy  I will see you in 6 months.   

## 2014-10-16 NOTE — Progress Notes (Signed)
[name] Date of Birth: 1926/06/22 Medical Record #[id number]  History of Present Illness: [name] is seen for followup of Pacemaker and atrial fibrillation. She has a history of symptomatic AV block and is status post pacemaker implant. On pacemaker followup she has been found to have paroxysmal atrial fibrillation. These episodes have been asymptomatic.  She denies any chest pain. She has no shortness of breath. No dizziness or syncope. She is on anticoagulation with Eliquis. Dose reduced for age and renal function.   Current Outpatient Prescriptions on File Prior to Visit  Medication Sig Dispense Refill  . apixaban (ELIQUIS) 2.5 MG TABS tablet Take 1 tablet (2.5 mg total) by mouth 2 (two) times daily. 180 tablet 3  . atorvastatin (LIPITOR) 20 MG tablet Take 1 tablet (20 mg total) by mouth daily. 90 tablet 1  . Calcium Carbonate-Vitamin D (CALCIUM 600/VITAMIN D PO) Take 1 capsule by mouth daily.    . multivitamin (THERAGRAN) per tablet Take 1 tablet by mouth daily.      . Omega-3 Fatty Acids (FISH OIL) 1200 MG CAPS Take one tablet by mouth daily    . ramipril (ALTACE) 5 MG capsule Take 1 capsule (5 mg total) by mouth daily. 90 capsule 3   No current facility-administered medications on file prior to visit.    Allergies  Allergen Reactions  . Anesthetics, Ester     Anesthetics not particularly Ester - redness    Past Medical History  Diagnosis Date  . Hypertension   . Hyperlipidemia   . Pacemaker     permanent for mobitz II 2nd degree AV block (MDT)  . History of colonic polyps   . Osteopenia   . Paroxysmal atrial fibrillation   . Syncope 11/2005    ECHO ao sclerosis (observe per cards)  . Skin cancer 2010    Left leg, SCC, sees derm  . Cataract     REMOVED    Past Surgical History  Procedure Laterality Date  . Ppm      For mobitz II second degree AV block 2008  . Hemorrhoid surgery    . Total abdominal hysterectomy      oophorectomy  . Appendectomy     . Breast biopsy Bilateral     several  . Insert / replace / remove pacemaker    . US echocardiography  12/08/2005    EF 55-60%  . Cataracts Bilateral   . Colonoscopy w/ biopsies      History  Smoking status  . Former Smoker  . Types: Cigarettes  . Quit date: 08/23/1988  Smokeless tobacco  . Never Used    Comment: >20 yrs ago, used to smoke ~ 1 ppd    History  Alcohol Use  . Yes    Comment: socially    Family History  Problem Relation Age of Onset  . Colon cancer Neg Hx   . Diabetes Neg Hx   . Breast cancer Sister   . Hypertension Sister   . Coronary artery disease Other     F, brothers x 2other family members  . Heart failure Mother   . Prostate cancer Brother   . Heart disease Father     Review of Systems: As noted in history of present illness.  All other systems were reviewed and are negative.  Physical Exam: BP 128/80 mmHg  Pulse 80  Ht 5\' 5"  (1.651 m)  Wt 55.974 kg (123 lb 6.4 oz)  BMI 20.53 kg/m2 She is very pleasant and  in no distress. The HEENT exam is normal. The carotids are 2+ without bruits.  There is no thyromegaly.  There is no JVD.  The lungs are clear.  Her pacemaker site is normal. The heart exam reveals a regular rate with a normal S1 and S2.  There are no murmurs, gallops, or rubs.  The PMI is not displaced.     There is no hepatosplenomegaly or tenderness.  There are no masses.  Exam of the legs reveal no clubbing, cyanosis, or edema.    The distal pulses are intact.  Cranial nerves II - XII are intact.  Motor and sensory functions are intact.  The gait is normal.  LABORATORY DATA: Lab Results  Component Value Date   WBC 6.3 12/19/2013   HGB 13.5 12/19/2013   HCT 40.4 12/19/2013   PLT 230.0 12/19/2013   GLUCOSE 91 10/15/2013   CHOL 194 12/19/2013   TRIG 65.0 12/19/2013   HDL 80.50 12/19/2013   LDLDIRECT 105.8 09/07/2012   LDLCALC 101* 12/19/2013   ALT 23 12/19/2013   AST 31 12/19/2013   NA 138 10/15/2013   K 5.0 10/15/2013   CL 101  10/15/2013   CREATININE 0.61 10/15/2013   BUN 19 10/15/2013   CO2 31 10/15/2013   TSH 2.12 09/07/2012      Assessment / Plan: 1. Paroxysmal atrial fibrillation. Patient is asymptomatic. Last pacemaker check showed burden of 0.3%.  Continue on Apixaban 2.5 mg twice a day.  2. Mobitz type 2 second degree AV block status post pacemaker implant. Continue follow up in pacemaker clinic.  3. Hypertension, controlled.  4. Hyperlipidemia.

## 2014-10-24 ENCOUNTER — Other Ambulatory Visit: Payer: Self-pay | Admitting: Internal Medicine

## 2014-10-24 DIAGNOSIS — N63 Unspecified lump in unspecified breast: Secondary | ICD-10-CM

## 2014-11-11 DIAGNOSIS — H35033 Hypertensive retinopathy, bilateral: Secondary | ICD-10-CM | POA: Diagnosis not present

## 2014-11-11 DIAGNOSIS — H524 Presbyopia: Secondary | ICD-10-CM | POA: Diagnosis not present

## 2014-11-11 DIAGNOSIS — H02403 Unspecified ptosis of bilateral eyelids: Secondary | ICD-10-CM | POA: Diagnosis not present

## 2014-11-11 DIAGNOSIS — H26492 Other secondary cataract, left eye: Secondary | ICD-10-CM | POA: Diagnosis not present

## 2014-11-12 ENCOUNTER — Encounter: Payer: Self-pay | Admitting: Internal Medicine

## 2014-11-12 ENCOUNTER — Telehealth: Payer: Self-pay | Admitting: Cardiology

## 2014-11-12 ENCOUNTER — Ambulatory Visit (INDEPENDENT_AMBULATORY_CARE_PROVIDER_SITE_OTHER): Payer: Medicare Other | Admitting: *Deleted

## 2014-11-12 DIAGNOSIS — I442 Atrioventricular block, complete: Secondary | ICD-10-CM | POA: Diagnosis not present

## 2014-11-12 NOTE — Telephone Encounter (Signed)
Spoke with pt and reminded pt of remote transmission that is due today. Pt verbalized understanding.   

## 2014-11-12 NOTE — Progress Notes (Signed)
Remote pacemaker transmission.   

## 2014-11-13 ENCOUNTER — Other Ambulatory Visit: Payer: Self-pay | Admitting: Internal Medicine

## 2014-11-13 ENCOUNTER — Ambulatory Visit
Admission: RE | Admit: 2014-11-13 | Discharge: 2014-11-13 | Disposition: A | Discharge status: Home / Self Care | Payer: Medicare Other | Source: Ambulatory Visit | Attending: Internal Medicine | Admitting: Internal Medicine

## 2014-11-13 DIAGNOSIS — N63 Unspecified lump in unspecified breast: Secondary | ICD-10-CM

## 2014-11-14 ENCOUNTER — Other Ambulatory Visit: Payer: Self-pay | Admitting: Internal Medicine

## 2014-11-14 DIAGNOSIS — N63 Unspecified lump in unspecified breast: Secondary | ICD-10-CM

## 2014-11-17 ENCOUNTER — Ambulatory Visit
Admission: RE | Admit: 2014-11-17 | Discharge: 2014-11-17 | Disposition: A | Discharge status: Home / Self Care | Payer: Medicare Other | Source: Ambulatory Visit | Attending: Internal Medicine | Admitting: Internal Medicine

## 2014-11-17 DIAGNOSIS — N63 Unspecified lump in unspecified breast: Secondary | ICD-10-CM

## 2014-11-17 DIAGNOSIS — N6011 Diffuse cystic mastopathy of right breast: Secondary | ICD-10-CM | POA: Diagnosis not present

## 2014-11-18 LAB — CUP PACEART REMOTE DEVICE CHECK
Battery Remaining Longevity: 26 mo
Battery Voltage: 2.73 V
Brady Statistic AS VP Percent: 0 %
Date Time Interrogation Session: 20160810162725
Lead Channel Impedance Value: 458 Ohm
Lead Channel Pacing Threshold Amplitude: 0.875 V
Lead Channel Pacing Threshold Pulse Width: 0.4 ms
Lead Channel Setting Pacing Amplitude: 2.5 V
Lead Channel Setting Sensing Sensitivity: 5.6 mV
MDC IDC MSMT BATTERY IMPEDANCE: 1974 Ohm
MDC IDC MSMT LEADCHNL RA PACING THRESHOLD AMPLITUDE: 0.375 V
MDC IDC MSMT LEADCHNL RA PACING THRESHOLD PULSEWIDTH: 0.4 ms
MDC IDC MSMT LEADCHNL RA SENSING INTR AMPL: 2.8 mV
MDC IDC MSMT LEADCHNL RV IMPEDANCE VALUE: 485 Ohm
MDC IDC MSMT LEADCHNL RV SENSING INTR AMPL: 11.2 mV
MDC IDC SET LEADCHNL RA PACING AMPLITUDE: 2 V
MDC IDC SET LEADCHNL RV PACING PULSEWIDTH: 0.4 ms
MDC IDC STAT BRADY AP VP PERCENT: 0 %
MDC IDC STAT BRADY AP VS PERCENT: 18 %
MDC IDC STAT BRADY AS VS PERCENT: 82 %

## 2014-11-25 ENCOUNTER — Encounter: Payer: Self-pay | Admitting: Cardiology

## 2015-01-02 ENCOUNTER — Telehealth: Payer: Self-pay | Admitting: Internal Medicine

## 2015-01-02 NOTE — Telephone Encounter (Signed)
pre visit letter mailed 01/02/15 °

## 2015-01-19 ENCOUNTER — Telehealth: Payer: Self-pay | Admitting: Cardiology

## 2015-01-21 ENCOUNTER — Other Ambulatory Visit: Payer: Self-pay | Admitting: Internal Medicine

## 2015-01-22 NOTE — Telephone Encounter (Signed)
Close encounter 

## 2015-01-23 ENCOUNTER — Ambulatory Visit (INDEPENDENT_AMBULATORY_CARE_PROVIDER_SITE_OTHER): Payer: Medicare Other | Admitting: Internal Medicine

## 2015-01-23 ENCOUNTER — Encounter: Payer: Self-pay | Admitting: Internal Medicine

## 2015-01-23 VITALS — BP 126/74 | HR 82 | Temp 98.2°F | Ht 65.0 in | Wt 123.5 lb

## 2015-01-23 DIAGNOSIS — Z23 Encounter for immunization: Secondary | ICD-10-CM

## 2015-01-23 DIAGNOSIS — E785 Hyperlipidemia, unspecified: Secondary | ICD-10-CM

## 2015-01-23 DIAGNOSIS — M8580 Other specified disorders of bone density and structure, unspecified site: Secondary | ICD-10-CM | POA: Diagnosis not present

## 2015-01-23 DIAGNOSIS — I10 Essential (primary) hypertension: Secondary | ICD-10-CM | POA: Diagnosis not present

## 2015-01-23 DIAGNOSIS — Z0000 Encounter for general adult medical examination without abnormal findings: Secondary | ICD-10-CM

## 2015-01-23 DIAGNOSIS — Z09 Encounter for follow-up examination after completed treatment for conditions other than malignant neoplasm: Secondary | ICD-10-CM

## 2015-01-23 LAB — COMPREHENSIVE METABOLIC PANEL
ALK PHOS: 72 U/L (ref 39–117)
ALT: 20 U/L (ref 0–35)
AST: 25 U/L (ref 0–37)
Albumin: 4 g/dL (ref 3.5–5.2)
BILIRUBIN TOTAL: 0.7 mg/dL (ref 0.2–1.2)
BUN: 18 mg/dL (ref 6–23)
CALCIUM: 9.9 mg/dL (ref 8.4–10.5)
CO2: 30 mEq/L (ref 19–32)
Chloride: 105 mEq/L (ref 96–112)
Creatinine, Ser: 0.76 mg/dL (ref 0.40–1.20)
GFR: 76.62 mL/min (ref >60.00)
Glucose, Bld: 105 mg/dL — ABNORMAL HIGH (ref 70–99)
POTASSIUM: 5.1 meq/L (ref 3.5–5.1)
Sodium: 141 mEq/L (ref 135–145)
TOTAL PROTEIN: 7.2 g/dL (ref 6.0–8.3)

## 2015-01-23 LAB — LIPID PANEL
CHOL/HDL RATIO: 2
CHOLESTEROL: 186 mg/dL (ref 0–200)
HDL: 86.6 mg/dL (ref >39.00)
LDL CALC: 90 mg/dL (ref 0–99)
NONHDL: 98.98
TRIGLYCERIDES: 47 mg/dL (ref 0.0–149.0)
VLDL: 9.4 mg/dL (ref 0.0–40.0)

## 2015-01-23 LAB — CBC WITH DIFFERENTIAL/PLATELET
BASOS ABS: 0 10*3/uL (ref 0.0–0.1)
Basophils Relative: 0.5 % (ref 0.0–3.0)
EOS ABS: 0.1 10*3/uL (ref 0.0–0.7)
Eosinophils Relative: 1.1 % (ref 0.0–5.0)
HEMATOCRIT: 40.9 % (ref 36.0–46.0)
Hemoglobin: 13.6 g/dL (ref 12.0–15.0)
LYMPHS PCT: 27.4 % (ref 12.0–46.0)
Lymphs Abs: 1.8 10*3/uL (ref 0.7–4.0)
MCHC: 33.2 g/dL (ref 30.0–36.0)
MCV: 85.5 fl (ref 78.0–100.0)
Monocytes Absolute: 0.7 10*3/uL (ref 0.1–1.0)
Monocytes Relative: 10.3 % (ref 3.0–12.0)
NEUTROS ABS: 4 10*3/uL (ref 1.4–7.7)
NEUTROS PCT: 60.7 % (ref 43.0–77.0)
PLATELETS: 244 10*3/uL (ref 150.0–400.0)
RBC: 4.78 Mil/uL (ref 3.87–5.11)
RDW: 13.7 % (ref 11.5–15.5)
WBC: 6.6 10*3/uL (ref 4.0–10.5)

## 2015-01-23 MED ORDER — RAMIPRIL 5 MG PO CAPS: 5.0000 mg | ORAL_CAPSULE | Freq: Every day | ORAL | Status: DC

## 2015-01-23 MED ORDER — ATORVASTATIN CALCIUM 20 MG PO TABS: 20.0000 mg | ORAL_TABLET | Freq: Every day | ORAL | Status: DC

## 2015-01-23 NOTE — Assessment & Plan Note (Addendum)
Td today  Had a shingles shot 2011  Pneumonia shot 2005 prevnar--2015 Had a flu shot no further screening for cervical ca h/o abnormal MMG and breast Bx; breast bx neg 11-2014 [facility]-- Cscope 8-09, 2 polyps,  Cscope 8-14, + polyps, no further scopes  Continue with her healthy lifestyle H/o ?palpable Ao--- abd u/s 2013 (-) for AAA

## 2015-01-23 NOTE — Patient Instructions (Signed)
Get your blood work before you leave       Next visit  for a complete physical exam in one year, fasting    Please schedule an appointment at the front desk     Fall Prevention and [place] cause injuries and can affect all age groups. It is possible to use preventive measures to significantly decrease the likelihood of falls. There are many simple measures which can make your home safer and prevent falls. OUTDOORS  Repair cracks and edges of walkways and driveways.  Remove high doorway thresholds.  Trim shrubbery on the main path into your home.  Have good outside lighting.  Clear walkways of tools, rocks, debris, and clutter.  Check that handrails are not broken and are securely fastened. Both sides of steps should have handrails.  Have leaves, snow, and ice cleared regularly.  Use sand or salt on walkways during winter months.  In the garage, clean up grease or oil spills. BATHROOM  Install night lights.  Install grab bars by the toilet and in the tub and shower.  Use non-skid mats or decals in the tub or shower.  Place a plastic non-slip stool in the shower to sit on, if needed.  Keep floors dry and clean up all water on the floor immediately.  Remove soap buildup in the tub or shower on a regular basis.  Secure bath mats with non-slip, double-sided rug tape.  Remove throw rugs and tripping hazards from the floors. BEDROOMS  Install night lights.  Make sure a bedside light is easy to reach.  Do not use oversized bedding.  Keep a telephone by your bedside.  Have a firm chair with side arms to use for getting dressed.  Remove throw rugs and tripping hazards from the floor. KITCHEN  Keep handles on pots and pans turned toward the center of the stove. Use back burners when possible.  Clean up spills quickly and allow time for drying.  Avoid walking on wet floors.  Avoid hot utensils and knives.  Position shelves so they are not too high  or low.  Place commonly used objects within easy reach.  If necessary, use a sturdy step stool with a grab bar when reaching.  Keep electrical cables out of the way.  Do not use floor polish or wax that makes floors slippery. If you must use wax, use non-skid floor wax.  Remove throw rugs and tripping hazards from the floor. STAIRWAYS  Never leave objects on stairs.  Place handrails on both sides of stairways and use them. Fix any loose handrails. Make sure handrails on both sides of the stairways are as long as the stairs.  Check carpeting to make sure it is firmly attached along stairs. Make repairs to worn or loose carpet promptly.  Avoid placing throw rugs at the top or bottom of stairways, or properly secure the rug with carpet tape to prevent slippage. Get rid of throw rugs, if possible.  Have an electrician put in a light switch at the top and bottom of the stairs. OTHER FALL PREVENTION TIPS  Wear low-heel or rubber-soled shoes that are supportive and fit well. Wear closed toe shoes.  When using a stepladder, make sure it is fully opened and both spreaders are firmly locked. Do not climb a closed stepladder.  Add color or contrast paint or tape to grab bars and handrails in your home. Place contrasting color strips on first and last steps.  Learn and use mobility aids as  needed. Install an electrical emergency response system.  Turn on lights to avoid dark areas. Replace light bulbs that burn out immediately. Get light switches that glow.  Arrange furniture to create clear pathways. Keep furniture in the same place.  Firmly attach carpet with non-skid or double-sided tape.  Eliminate uneven floor surfaces.  Select a carpet pattern that does not visually hide the edge of steps.  Be aware of all pets. OTHER HOME SAFETY TIPS  Set the water temperature for 120 F (48.8 C).  Keep emergency numbers on or near the telephone.  Keep smoke detectors on every level of  the home and near sleeping areas. Document Released: 03/11/2002 Document Revised: 09/20/2011 Document Reviewed: 06/10/2011 [facility] Patient Information 2015 [place], [place]. This information is not intended to replace advice given to you by your health care provider. Make sure you discuss any questions you have with your health care provider.   Preventive Care for Adults Ages 51 and over  Blood pressure check.** / Every 1 to 2 years.  Lipid and cholesterol check.**/ Every 5 years beginning at age 69.  Lung cancer screening. / Every year if you are aged 43-80 years and have a 30-pack-year history of smoking and currently smoke or have quit within the past 15 years. Yearly screening is stopped once you have quit smoking for at least 15 years or develop a health problem that would prevent you from having lung cancer treatment.  Fecal occult blood test (FOBT) of stool. / Every year beginning at age 49 and continuing until age 68. You may not have to do this test if you get a colonoscopy every 10 years.  Flexible sigmoidoscopy** or colonoscopy.** / Every 5 years for a flexible sigmoidoscopy or every 10 years for a colonoscopy beginning at age 67 and continuing until age 17.  Hepatitis C blood test.** / For all people born from [birth year] through 1963 and any individual with known risks for hepatitis C.  Abdominal aortic aneurysm (AAA) screening.** / A one-time screening for ages 30 to 48 years who are current or former smokers.  Skin self-exam. / Monthly.  Influenza vaccine. / Every year.  Tetanus, diphtheria, and acellular pertussis (Tdap/Td) vaccine.** / 1 dose of Td every 10 years.  Varicella vaccine.** / Consult your health care provider.  Zoster vaccine.** / 1 dose for adults aged 29 years or older.  Pneumococcal 13-valent conjugate (PCV13) vaccine.** / Consult your health care provider.  Pneumococcal polysaccharide (PPSV23) vaccine.** / 1 dose for all adults aged 88 years and  older.  Meningococcal vaccine.** / Consult your health care provider.  Hepatitis A vaccine.** / Consult your health care provider.  Hepatitis B vaccine.** / Consult your health care provider.  Haemophilus influenzae type b (Hib) vaccine.** / Consult your health care provider. **Family history and personal history of risk and conditions may change your health care provider's recommendations. Document Released: 05/17/2001 Document Revised: 03/26/2013 Document Reviewed: 08/16/2010 [facility] Patient Information 2015 [place], [place]. This information is not intended to replace advice given to you by your health care provider. Make sure you discuss any questions you have with your health care provider.

## 2015-01-23 NOTE — Progress Notes (Signed)
Pre visit review using our clinic review tool, if applicable. No additional management support is needed unless otherwise documented below in the visit note. 

## 2015-01-23 NOTE — Progress Notes (Signed)
Subjective:    Patient ID: Peggy Mendez, female    DOB: [birth year], 81 y.o.   MRN: [id number]  DOS:  01/23/2015  Type of visit - description :    Here for Medicare AWV:   1. Risk factors based on Past M, S, F history: reviewed   2. Physical Activities:  Active, use to do more but has DJD   3. Depression/mood: (-) screening   4. Hearing: No problems noted or reported   5. ADL's: independent,  drives   6. Fall Risk: no recent falls, prevention discussed   7. Home Safety: does feel safe at home   8. Height, weight, &visual acuity: See VS, vision corrected w/ glasses , s/p cataracts surgery , sees eye MD q year  9. Counseling: provided    10. Labs ordered based on risk factors: yes   11. Referral Coordination:if needed   12. Care Plan, see a/p   13. Cognitive Assessment: motor and cognition seem appropiate for age  6. Providers list updated  15. End of life: has a HC-POA  In addition we discussed the following:   HTN, good compliance with medications, BP today is very good. High cholesterol: Due for labs. No apparent side effects Atrial fibrillation, anticoagulated, denies problems with bleeding.   Review of Systems Constitutional: No fever. No chills. No unexplained wt changes. No unusual sweats  HEENT: No dental problems, no ear discharge, no facial swelling, no voice changes. No eye discharge, no eye  redness , no  intolerance to light   Respiratory: No wheezing , no  difficulty breathing. No cough , no mucus production  Cardiovascular: No CP, no leg swelling , no  Palpitations  GI: no nausea, no vomiting, no diarrhea , no  abdominal pain.  No blood in the stools. No dysphagia, no odynophagia    Endocrine: No polyphagia, no polyuria , no polydipsia  GU: No dysuria, gross hematuria, difficulty urinating. No urinary urgency, no frequency.  Musculoskeletal: No joint swellings or unusual aches or pains  Skin: No change in the color of the skin, palor , no   Rash  Allergic, immunologic: No environmental allergies , no  food allergies  Neurological: No dizziness no  syncope. No headaches. No diplopia, no slurred, no slurred speech, no motor deficits, no facial  Numbness  Hematological: No enlarged lymph nodes, no easy bruising , no unusual bleedings  Psychiatry: No suicidal ideas, no hallucinations, no beavior problems, no confusion.  No unusual/severe anxiety, no depression   Past Medical History  Diagnosis Date  . Hypertension   . Hyperlipidemia   . Pacemaker     permanent for mobitz II 2nd degree AV block (MDT)  . History of colonic polyps   . Osteopenia   . Paroxysmal atrial fibrillation (HCC)   . Syncope 11/2005    ECHO ao sclerosis (observe per cards)  . Skin cancer 2010    Left leg, SCC, sees derm  . Cataract     REMOVED    Past Surgical History  Procedure Laterality Date  . Ppm      For mobitz II second degree AV block 2008  . Hemorrhoid surgery    . Total abdominal hysterectomy      oophorectomy  . Appendectomy    . Breast biopsy Bilateral     several, 11/2014 showed fibrocystic changes, no evidence of malignancy  . Insert / replace / remove pacemaker    . US echocardiography  12/08/2005    EF 55-60%  .  Cataracts Bilateral   . Colonoscopy w/ biopsies    . Breast biopsy Right 11-2015    Social History   Social History  . Marital Status: Single    Spouse Name: N/A  . Number of Children: 0  . Years of Education: N/A   Occupational History  . retired     Social History Main Topics  . Smoking status: Former Smoker    Types: Cigarettes    Quit date: 08/23/1988  . Smokeless tobacco: Never Used     Comment: >20 yrs ago, used to smoke ~ 1 ppd  . Alcohol Use: Yes     Comment: socially  . Drug Use: No  . Sexual Activity: Not on file   Other Topics Concern  . Not on file   Social History Narrative   Lives by herself,  still drives   Has a twin sister in [place], a nephew in [place].                        Family History  Problem Relation Age of Onset  . Colon cancer Neg Hx   . Diabetes Neg Hx   . Breast cancer Sister   . Hypertension Sister   . Coronary artery disease Other     F, brothers x 2other family members  . Heart failure Mother   . Prostate cancer Brother   . Heart disease Father        Medication List       This list is accurate as of: 01/23/15 11:59 PM.  Always use your most recent med list.               apixaban 2.5 MG Tabs tablet  Commonly known as:  ELIQUIS  Take 1 tablet (2.5 mg total) by mouth 2 (two) times daily.     atorvastatin 20 MG tablet  Commonly known as:  LIPITOR  Take 1 tablet (20 mg total) by mouth daily.     CALCIUM 600/VITAMIN Peggy PO  Take 1 capsule by mouth daily.     Fish Oil 1200 MG Caps  Take one tablet by mouth daily     multivitamin per tablet  Take 1 tablet by mouth daily.     ramipril 5 MG capsule  Commonly known as:  ALTACE  Take 1 capsule (5 mg total) by mouth daily.           Objective:   Physical Exam BP 126/74 mmHg  Pulse 82  Temp(Src) 98.2 F (36.8 C) (Oral)  Ht 5\' 5"  (1.651 m)  Wt 123 lb 8 oz (56.019 kg)  BMI 20.55 kg/m2  SpO2 97% General:   Well developed, well nourished . NAD.  Neck:  No  thyromegaly , normal carotid pulse HEENT:  Normocephalic . Face symmetric, atraumatic Lungs:  CTA B Normal respiratory effort, no intercostal retractions, no accessory muscle use. Heart: RRR,  no murmur.  No pretibial edema bilaterally  Abdomen:  Not distended, soft, non-tender. No rebound or rigidity.barely palpable aorta upper abdomen without bruit Skin: Exposed areas without rash. Not pale. Not jaundice Neurologic:  alert & oriented X3.  Speech normal, gait appropriate for age and unassisted Strength symmetric and appropriate for age.  Psych: Cognition and judgment appear intact.  Cooperative with normal attention span and concentration.  Behavior appropriate. No anxious or depressed appearing.     Assessment & Plan:   Assessment>  HTN Hyperlipidemia CV: --Paroxysmal A. Fib >> ELIQUIS --Pacemaker for Mobitz  2 --Syncope 2007, ECHO aortic sclerosis, saw cardiology Osteopenia DEA 08-2006 and 10-2008 -----T score at the spine normal. DEXA 02-2011 T score of the hips was -2.0, -2.1, mild osteopenia. Declined rx as of 01-2015 DJD  Skin cancer, SCC, left leg, sees dermatology FH breast ca -sister, brother CHF  Plan  HTN: Continue Altace, check a BMP, CBC. Hyperlipidemia: Good compliance with statins, check labs. Atrial fibrillation: Seems to be doing well, she sees cardiology regularly Osteopenia: due for a  density test, declined, states she won't be taking any medications. Recommend to continue with calcium and vitamin Peggy and physical activity RTC one year

## 2015-01-24 DIAGNOSIS — Z09 Encounter for follow-up examination after completed treatment for conditions other than malignant neoplasm: Secondary | ICD-10-CM | POA: Insufficient documentation

## 2015-01-24 NOTE — Assessment & Plan Note (Signed)
HTN: Continue Altace, check a BMP, CBC. Hyperlipidemia: Good compliance with statins, check labs. Atrial fibrillation: Seems to be doing well, she sees cardiology regularly Osteopenia: due for a  density test, declined, states she won't be taking any medications. Recommend to continue with calcium and vitamin D and physical activity RTC one year

## 2015-02-11 ENCOUNTER — Telehealth: Payer: Self-pay | Admitting: Cardiology

## 2015-02-11 ENCOUNTER — Ambulatory Visit (INDEPENDENT_AMBULATORY_CARE_PROVIDER_SITE_OTHER): Payer: Medicare Other | Admitting: *Deleted

## 2015-02-11 DIAGNOSIS — I442 Atrioventricular block, complete: Secondary | ICD-10-CM

## 2015-02-11 NOTE — Telephone Encounter (Signed)
Spoke with pt and reminded pt of remote transmission that is due today. Pt verbalized understanding.   

## 2015-02-11 NOTE — Progress Notes (Signed)
Remote pacemaker transmission.   

## 2015-02-12 LAB — CUP PACEART REMOTE DEVICE CHECK
Battery Impedance: 2206 Ohm
Brady Statistic AP VS Percent: 19 %
Brady Statistic AS VS Percent: 81 %
Date Time Interrogation Session: 20161109174929
Implantable Lead Location: 753860
Implantable Lead Model: 5076
Lead Channel Pacing Threshold Amplitude: 0.5 V
Lead Channel Pacing Threshold Amplitude: 0.875 V
Lead Channel Pacing Threshold Pulse Width: 0.4 ms
Lead Channel Sensing Intrinsic Amplitude: 11.2 mV
Lead Channel Setting Pacing Amplitude: 2 V
Lead Channel Setting Sensing Sensitivity: 5.6 mV
MDC IDC LEAD IMPLANT DT: 20080318
MDC IDC LEAD IMPLANT DT: 20080318
MDC IDC LEAD LOCATION: 753859
MDC IDC MSMT BATTERY REMAINING LONGEVITY: 24 mo
MDC IDC MSMT BATTERY VOLTAGE: 2.71 V
MDC IDC MSMT LEADCHNL RA IMPEDANCE VALUE: 466 Ohm
MDC IDC MSMT LEADCHNL RA PACING THRESHOLD PULSEWIDTH: 0.4 ms
MDC IDC MSMT LEADCHNL RA SENSING INTR AMPL: 2.8 mV
MDC IDC MSMT LEADCHNL RV IMPEDANCE VALUE: 500 Ohm
MDC IDC SET LEADCHNL RV PACING AMPLITUDE: 2.5 V
MDC IDC SET LEADCHNL RV PACING PULSEWIDTH: 0.4 ms
MDC IDC STAT BRADY AP VP PERCENT: 0 %
MDC IDC STAT BRADY AS VP PERCENT: 0 %

## 2015-02-13 ENCOUNTER — Encounter: Payer: Self-pay | Admitting: Cardiology

## 2015-03-31 ENCOUNTER — Other Ambulatory Visit: Payer: Self-pay

## 2015-03-31 DIAGNOSIS — I480 Paroxysmal atrial fibrillation: Secondary | ICD-10-CM

## 2015-03-31 MED ORDER — APIXABAN 2.5 MG PO TABS: 2.5000 mg | ORAL_TABLET | Freq: Two times a day (BID) | ORAL | Status: DC

## 2015-04-20 ENCOUNTER — Other Ambulatory Visit: Payer: Self-pay

## 2015-04-20 ENCOUNTER — Other Ambulatory Visit: Payer: Self-pay | Admitting: Internal Medicine

## 2015-04-20 DIAGNOSIS — N63 Unspecified lump in unspecified breast: Secondary | ICD-10-CM

## 2015-04-27 ENCOUNTER — Encounter: Payer: Self-pay | Admitting: Cardiology

## 2015-04-27 ENCOUNTER — Ambulatory Visit (INDEPENDENT_AMBULATORY_CARE_PROVIDER_SITE_OTHER): Payer: Medicare Other | Admitting: Cardiology

## 2015-04-27 VITALS — BP 138/72 | HR 91 | Ht 65.0 in | Wt 124.0 lb

## 2015-04-27 DIAGNOSIS — I10 Essential (primary) hypertension: Secondary | ICD-10-CM

## 2015-04-27 DIAGNOSIS — I480 Paroxysmal atrial fibrillation: Secondary | ICD-10-CM

## 2015-04-27 DIAGNOSIS — I441 Atrioventricular block, second degree: Secondary | ICD-10-CM | POA: Diagnosis not present

## 2015-04-27 MED ORDER — LOSARTAN POTASSIUM 50 MG PO TABS: 50.0000 mg | ORAL_TABLET | Freq: Every day | ORAL | Status: DC

## 2015-04-27 NOTE — Progress Notes (Signed)
D J Lorusso Date of Birth: 26-Mar-1927 Medical Record [id number]  History of Present Illness: Peggy Mendez is seen for followup of Pacemaker and atrial fibrillation. She has a history of symptomatic AV block and is status post pacemaker implant. On pacemaker followup she has been found to have paroxysmal atrial fibrillation. These episodes have been asymptomatic.  She denies any chest pain. She has no shortness of breath. No dizziness or syncope. She is on anticoagulation with Eliquis. Dose reduced for age and renal function. Her last pacemaker evaluation showed some Afib but burden less than 0.2%. No sustained ventricular high rates.  She stays active. She does note some numbness in her arms at night bilaterally. She reports a history of spinal stenosis. She also notes increased dry cough. Notes more problems with balance particularly when she gets up at night.  Current Outpatient Prescriptions on File Prior to Visit  Medication Sig Dispense Refill  . apixaban (ELIQUIS) 2.5 MG TABS tablet Take 1 tablet (2.5 mg total) by mouth 2 (two) times daily. 180 tablet 3  . atorvastatin (LIPITOR) 20 MG tablet Take 1 tablet (20 mg total) by mouth daily. 90 tablet 3  . Calcium Carbonate-Vitamin D (CALCIUM 600/VITAMIN D PO) Take 1 capsule by mouth daily.    . multivitamin (THERAGRAN) per tablet Take 1 tablet by mouth daily.      . Omega-3 Fatty Acids (FISH OIL) 1200 MG CAPS Take one tablet by mouth daily     No current facility-administered medications on file prior to visit.    Allergies  Allergen Reactions  . Anesthetics, Ester     Anesthetics not particularly Ester - redness    Past Medical History  Diagnosis Date  . Hypertension   . Hyperlipidemia   . Pacemaker     permanent for mobitz II 2nd degree AV block (MDT)  . History of colonic polyps   . Osteopenia   . Paroxysmal atrial fibrillation (HCC)   . Syncope 11/2005    ECHO ao sclerosis (observe per cards)  . Skin cancer 2010    Left  leg, SCC, sees derm  . Cataract     REMOVED    Past Surgical History  Procedure Laterality Date  . Ppm      For mobitz II second degree AV block 2008  . Hemorrhoid surgery    . Total abdominal hysterectomy      oophorectomy  . Appendectomy    . Breast biopsy Bilateral     several, 11/2014 showed fibrocystic changes, no evidence of malignancy  . Insert / replace / remove pacemaker    . US echocardiography  12/08/2005    EF 55-60%  . Cataracts Bilateral   . Colonoscopy w/ biopsies    . Breast biopsy Right 11-2015    History  Smoking status  . Former Smoker  . Types: Cigarettes  . Quit date: 08/23/1988  Smokeless tobacco  . Never Used    Comment: >20 yrs ago, used to smoke ~ 1 ppd    History  Alcohol Use  . Yes    Comment: socially    Family History  Problem Relation Age of Onset  . Colon cancer Neg Hx   . Diabetes Neg Hx   . Breast cancer Sister   . Hypertension Sister   . Coronary artery disease Other     F, brothers x 2other family members  . Heart failure Mother   . Prostate cancer Brother   . Heart disease Father  Review of Systems: As noted in history of present illness.  All other systems were reviewed and are negative.  Physical Exam: BP 138/72 mmHg  Pulse 91  Ht 5\' 5"  (1.651 m)  Wt 56.246 kg (124 lb)  BMI 20.63 kg/m2 She is very pleasant and in no distress. The HEENT exam is normal. The carotids are 2+ without bruits.  There is no thyromegaly.  There is no JVD.  The lungs are clear.  Her pacemaker site is normal. The heart exam reveals a regular rate with a normal S1 and S2.  There are no murmurs, gallops, or rubs.  The PMI is not displaced.     There is no hepatosplenomegaly or tenderness.  There are no masses.  Exam of the legs reveal no clubbing, cyanosis, or edema.    The distal pulses are intact.  Cranial nerves II - XII are intact.  Motor and sensory functions are intact.  The gait is normal.  LABORATORY DATA: Lab Results  Component Value  Date   WBC 6.6 01/23/2015   HGB 13.6 01/23/2015   HCT 40.9 01/23/2015   PLT 244.0 01/23/2015   GLUCOSE 105* 01/23/2015   CHOL 186 01/23/2015   TRIG 47.0 01/23/2015   HDL 86.60 01/23/2015   LDLDIRECT 105.8 09/07/2012   LDLCALC 90 01/23/2015   ALT 20 01/23/2015   AST 25 01/23/2015   NA 141 01/23/2015   K 5.1 01/23/2015   CL 105 01/23/2015   CREATININE 0.76 01/23/2015   BUN 18 01/23/2015   CO2 30 01/23/2015   TSH 2.12 09/07/2012   Ecg: today shows NSR rate 91. Mild voltage for LVH. Otherwise normal. I have personally reviewed and interpreted this study.   Assessment / Plan: 1. Paroxysmal atrial fibrillation. Patient is asymptomatic.  Continue on Apixaban 2.5 mg twice a day.  2. Mobitz type 2 second degree AV block status post pacemaker implant. Keep follow up in pacemaker clinic.  3. Hypertension, controlled. But now with increased cough ? Related to ACEi. Will stop Altace and start losartan 50 mg daily. See if cough resolves.  4. Hyperlipidemia.

## 2015-04-27 NOTE — Patient Instructions (Signed)
Stop Altace  Start losartan 50 mg daily instead  I will see you in 6 months

## 2015-05-12 ENCOUNTER — Telehealth: Payer: Self-pay

## 2015-05-12 NOTE — Telephone Encounter (Signed)
Received a call from patient needing a prior authorization for eliquis.[employer] called.Pt ID # [id number].Eliquis approved until 04/03/16.

## 2015-05-19 ENCOUNTER — Encounter: Payer: Self-pay | Admitting: Nurse Practitioner

## 2015-05-19 NOTE — Progress Notes (Signed)
Electrophysiology Office Note Date: 05/20/2015  ID:  Peggy Mendez, DOB 14-Sep-1926, MRN [id number]  PCP: Kathlene November, MD Primary Cardiologist: Martinique Electrophysiologist: Allred  CC: Pacemaker follow-up  Peggy Mendez is a 82 y.o. female seen today for Dr Rayann Heman.  She presents today for routine electrophysiology followup.  Since last being seen in our clinic, the patient reports doing very well.  She denies chest pain, palpitations, dyspnea, PND, orthopnea, nausea, vomiting, dizziness, syncope, edema, weight gain, or early satiety.  Device History: MDT dual chamber PPM implanted 2008 for intermittent complete heart block   Past Medical History  Diagnosis Date  . Hypertension   . Hyperlipidemia   . History of colonic polyps   . Osteopenia   . Paroxysmal atrial fibrillation (HCC)   . Syncope 11/2005  . Skin cancer 2010    Left leg, SCC, sees derm  . Cataract     REMOVED  . Complete heart block (HCC)     a. s/p MDT dual chamber PPM    Past Surgical History  Procedure Laterality Date  . Ppm  2008    MDT dual chamber PPM implanted by Dr Verlon Setting for intermittent CHB  . Hemorrhoid surgery    . Total abdominal hysterectomy      oophorectomy  . Appendectomy    . Breast biopsy Bilateral     several, 11/2014 showed fibrocystic changes, no evidence of malignancy  . US echocardiography  12/08/2005    EF 55-60%  . Cataracts Bilateral   . Colonoscopy w/ biopsies    . Breast biopsy Right 11-2015    Current Outpatient Prescriptions  Medication Sig Dispense Refill  . apixaban (ELIQUIS) 2.5 MG TABS tablet Take 1 tablet (2.5 mg total) by mouth 2 (two) times daily. 180 tablet 3  . atorvastatin (LIPITOR) 20 MG tablet Take 1 tablet (20 mg total) by mouth daily. 90 tablet 3  . Calcium Carbonate-Vitamin Peggy (CALCIUM 600/VITAMIN Peggy PO) Take 1 capsule by mouth daily.    Marland Kitchen losartan (COZAAR) 50 MG tablet Take 1 tablet (50 mg total) by mouth daily. 90 tablet 3  . multivitamin (THERAGRAN) per tablet  Take 1 tablet by mouth daily.      . Omega-3 Fatty Acids (FISH OIL) 1200 MG CAPS Take one tablet by mouth daily     No current facility-administered medications for this visit.    Allergies:   Anesthetics, ester   Social History: Social History   Social History  . Marital Status: Single    Spouse Name: N/A  . Number of Children: 0  . Years of Education: N/A   Occupational History  . retired     Social History Main Topics  . Smoking status: Former Smoker    Types: Cigarettes    Quit date: 08/23/1988  . Smokeless tobacco: Never Used     Comment: >20 yrs ago, used to smoke ~ 1 ppd  . Alcohol Use: Yes     Comment: socially  . Drug Use: No  . Sexual Activity: Not on file   Other Topics Concern  . Not on file   Social History Narrative   Lives by herself,  still drives   Has a twin sister in [place], a nephew in [place].                      Family History: Family History  Problem Relation Age of Onset  . Colon cancer Neg Hx   . Diabetes Neg Hx   .  Breast cancer Sister   . Hypertension Sister   . Coronary artery disease Other     F, brothers x 2other family members  . Heart failure Mother   . Prostate cancer Brother   . Heart disease Father      Review of Systems: All other systems reviewed and are otherwise negative except as noted above.   Physical Exam: VS:  BP 124/66 mmHg  Pulse 86  Ht 5\' 5"  (1.651 m)  Wt 124 lb (56.246 kg)  BMI 20.63 kg/m2 , BMI Body mass index is 20.63 kg/(m^2).  GEN- The patient is elderly appearing, alert and oriented x 3 today.   HEENT: normocephalic, atraumatic; sclera clear, conjunctiva pink; hearing intact; oropharynx clear; neck supple  Lungs- Clear to ausculation bilaterally, normal work of breathing.  No wheezes, rales, rhonchi Heart- Regular rate and rhythm, no murmurs, rubs or gallops  GI- soft, non-tender, non-distended, bowel sounds present  Extremities- no clubbing, cyanosis, or edema; DP/PT/radial pulses 2+  bilaterally MS- no significant deformity or atrophy Skin- warm and dry, no rash or lesion; PPM pocket well healed Psych- euthymic mood, full affect Neuro- strength and sensation are intact  PPM Interrogation- reviewed in detail today,  See PACEART report  EKG:  EKG is not ordered today.  Recent Labs: 01/23/2015: ALT 20; BUN 18; Creatinine, Ser 0.76; Hemoglobin 13.6; Platelets 244.0; Potassium 5.1; Sodium 141   Wt Readings from Last 3 Encounters:  05/20/15 124 lb (56.246 kg)  04/27/15 124 lb (56.246 kg)  01/23/15 123 lb 8 oz (56.019 kg)     Other studies Reviewed: Additional studies/ records that were reviewed today include: Dr Martinique and Dr Jackalyn Lombard office notes  Assessment and Plan:  1.  Complete heart block  Normal PPM function See Pace Art report No changes today  2.  Paroxysmal atrial fibrillation Burden low by device interrogation  V rates controlled Continue Eliquis for CHADS2VASC of 4  3.  HTN Stable No change required today   Current medicines are reviewed at length with the patient today.   The patient does not have concerns regarding her medicines.  The following changes were made today:  none  Labs/ tests ordered today include: none   Disposition:   Follow up with [facility], 1 year with me, Dr Martinique as scheduled      Signed, Chanetta Marshall, NP 05/20/2015 1:19 PM  [place] [address] [phone] (office) [phone] (fax)

## 2015-05-20 ENCOUNTER — Telehealth: Payer: Self-pay | Admitting: Internal Medicine

## 2015-05-20 ENCOUNTER — Ambulatory Visit (INDEPENDENT_AMBULATORY_CARE_PROVIDER_SITE_OTHER): Payer: Medicare Other | Admitting: Nurse Practitioner

## 2015-05-20 ENCOUNTER — Encounter: Payer: Self-pay | Admitting: Internal Medicine

## 2015-05-20 VITALS — BP 124/66 | HR 86 | Ht 65.0 in | Wt 124.0 lb

## 2015-05-20 DIAGNOSIS — I442 Atrioventricular block, complete: Secondary | ICD-10-CM | POA: Diagnosis not present

## 2015-05-20 DIAGNOSIS — I480 Paroxysmal atrial fibrillation: Secondary | ICD-10-CM

## 2015-05-20 DIAGNOSIS — I10 Essential (primary) hypertension: Secondary | ICD-10-CM | POA: Diagnosis not present

## 2015-05-20 DIAGNOSIS — I441 Atrioventricular block, second degree: Secondary | ICD-10-CM | POA: Diagnosis not present

## 2015-05-20 LAB — CUP PACEART INCLINIC DEVICE CHECK
Implantable Lead Implant Date: 20080318
Implantable Lead Location: 753860
Lead Channel Setting Pacing Amplitude: 2 V
Lead Channel Setting Pacing Pulse Width: 0.4 ms
MDC IDC LEAD IMPLANT DT: 20080318
MDC IDC LEAD LOCATION: 753859
MDC IDC SESS DTM: 20170215150703
MDC IDC SET LEADCHNL RV PACING AMPLITUDE: 2.5 V
MDC IDC SET LEADCHNL RV SENSING SENSITIVITY: 5.6 mV

## 2015-05-20 NOTE — Patient Instructions (Signed)
Medication Instructions:   Your physician recommends that you continue on your current medications as directed. Please refer to the Current Medication list given to you today.   If you need a refill on your cardiac medications before your next appointment, please call your pharmacy.  Labwork:  NONE ORDER TODAY    Testing/Procedures: NONE ORDER TODAY    Follow-Up:    Remote monitoring is used to monitor your Pacemaker of ICD from home. This monitoring reduces the number of office visits required to check your device to one time per year. It allows [place] to keep an eye on the functioning of your device to ensure it is working properly. You are scheduled for a device check from home on 08/17/15..You may send your transmission at any time that day. If you have a wireless device, the transmission will be sent automatically. After your physician reviews your transmission, you will receive a postcard with your next transmission date.  Your physician wants you to follow-up in: [place] will receive a reminder letter in the mail two months in advance. If you don't receive a letter, please call our office to schedule the follow-up appointment.     Any Other Special Instructions Will Be Listed Below (If Applicable).

## 2015-05-20 NOTE — Telephone Encounter (Signed)
Pt was seen today and has questions re "report" she got today from the office visit, states diagnosis wrong, dates are wrong and Dr's names are wrong. pls call

## 2015-05-21 ENCOUNTER — Telehealth: Payer: Self-pay | Admitting: *Deleted

## 2015-05-21 NOTE — Telephone Encounter (Signed)
SPOKE TO PT

## 2015-05-25 ENCOUNTER — Ambulatory Visit
Admission: RE | Admit: 2015-05-25 | Discharge: 2015-05-25 | Disposition: A | Discharge status: Home / Self Care | Payer: Medicare Other | Source: Ambulatory Visit | Attending: Internal Medicine | Admitting: Internal Medicine

## 2015-05-25 DIAGNOSIS — N63 Unspecified lump in unspecified breast: Secondary | ICD-10-CM

## 2015-06-02 ENCOUNTER — Other Ambulatory Visit: Payer: Self-pay

## 2015-06-02 MED ORDER — ATORVASTATIN CALCIUM 20 MG PO TABS: 20.0000 mg | ORAL_TABLET | Freq: Every day | ORAL | Status: DC

## 2015-08-17 ENCOUNTER — Ambulatory Visit (INDEPENDENT_AMBULATORY_CARE_PROVIDER_SITE_OTHER): Payer: Medicare Other | Admitting: *Deleted

## 2015-08-17 DIAGNOSIS — I441 Atrioventricular block, second degree: Secondary | ICD-10-CM

## 2015-08-17 NOTE — Progress Notes (Signed)
Remote pacemaker transmission.   

## 2015-09-04 LAB — CUP PACEART REMOTE DEVICE CHECK
Battery Remaining Longevity: 10 mo
Battery Voltage: 2.67 V
Brady Statistic AP VP Percent: 0 %
Brady Statistic AS VP Percent: 0 %
Date Time Interrogation Session: 20170515122937
Implantable Lead Implant Date: 20080318
Implantable Lead Location: 753859
Implantable Lead Model: 5076
Implantable Lead Model: 5076
Lead Channel Impedance Value: 451 Ohm
Lead Channel Pacing Threshold Amplitude: 0.375 V
Lead Channel Pacing Threshold Pulse Width: 0.4 ms
Lead Channel Pacing Threshold Pulse Width: 0.4 ms
Lead Channel Setting Pacing Amplitude: 2.5 V
MDC IDC LEAD IMPLANT DT: 20080318
MDC IDC LEAD LOCATION: 753860
MDC IDC MSMT BATTERY IMPEDANCE: 3927 Ohm
MDC IDC MSMT LEADCHNL RA SENSING INTR AMPL: 2.8 mV
MDC IDC MSMT LEADCHNL RV IMPEDANCE VALUE: 555 Ohm
MDC IDC MSMT LEADCHNL RV PACING THRESHOLD AMPLITUDE: 0.75 V
MDC IDC MSMT LEADCHNL RV SENSING INTR AMPL: 11.2 mV
MDC IDC SET LEADCHNL RA PACING AMPLITUDE: 2 V
MDC IDC SET LEADCHNL RV PACING PULSEWIDTH: 0.4 ms
MDC IDC SET LEADCHNL RV SENSING SENSITIVITY: 5.6 mV
MDC IDC STAT BRADY AP VS PERCENT: 12 %
MDC IDC STAT BRADY AS VS PERCENT: 88 %

## 2015-09-16 ENCOUNTER — Encounter: Payer: Self-pay | Admitting: Cardiology

## 2015-11-01 NOTE — Progress Notes (Signed)
D J Masini Date of Birth: 05-01-1926 Medical Record [id number]  History of Present Illness: Peggy Mendez is seen for followup of Pacemaker and atrial fibrillation. She has a history of symptomatic AV block and is status post pacemaker implant. On pacemaker followup she has been found to have paroxysmal atrial fibrillation. These episodes have been asymptomatic.  She denies any chest pain. She has no shortness of breath. No dizziness or syncope. She is on anticoagulation with Eliquis. Dose reduced for age and renal function. Her last pacemaker evaluation showed some Afib but burden of 4.5 hours total. No sustained ventricular high rates.  She is limited by back and knee pain. She reports a history of spinal stenosis. Her cough resolved with stopping ACEi. Overall she is doing well.  Current Outpatient Prescriptions on File Prior to Visit  Medication Sig Dispense Refill  . apixaban (ELIQUIS) 2.5 MG TABS tablet Take 1 tablet (2.5 mg total) by mouth 2 (two) times daily. 180 tablet 3  . atorvastatin (LIPITOR) 20 MG tablet Take 1 tablet (20 mg total) by mouth daily. 90 tablet 2  . Calcium Carbonate-Vitamin D (CALCIUM 600/VITAMIN D PO) Take 1 capsule by mouth daily.    Marland Kitchen losartan (COZAAR) 50 MG tablet Take 1 tablet (50 mg total) by mouth daily. 90 tablet 3  . multivitamin (THERAGRAN) per tablet Take 1 tablet by mouth daily.      . Omega-3 Fatty Acids (FISH OIL) 1200 MG CAPS Take one tablet by mouth daily     No current facility-administered medications on file prior to visit.     Allergies  Allergen Reactions  . Anesthetics, Ester     Anesthetics not particularly Ester - nausea    Past Medical History:  Diagnosis Date  . Cataract    REMOVED  . Complete heart block (HCC)    a. s/p MDT dual chamber PPM   . History of colonic polyps   . Hyperlipidemia   . Hypertension   . Osteopenia   . Paroxysmal atrial fibrillation (HCC)   . Skin cancer 2010   Left leg, SCC, sees derm  . Syncope  11/2005    Past Surgical History:  Procedure Laterality Date  . APPENDECTOMY    . BREAST BIOPSY Bilateral    several, 11/2014 showed fibrocystic changes, no evidence of malignancy  . BREAST BIOPSY Right 11-2015  . cataracts Bilateral   . COLONOSCOPY W/ BIOPSIES    . HEMORRHOID SURGERY    . PPM  2008   MDT dual chamber PPM implanted by Dr Verlon Setting for intermittent CHB  . TOTAL ABDOMINAL HYSTERECTOMY     oophorectomy  . US ECHOCARDIOGRAPHY  12/08/2005   EF 55-60%    History  Smoking Status  . Former Smoker  . Types: Cigarettes  . Quit date: 08/23/1988  Smokeless Tobacco  . Never Used    Comment: >20 yrs ago, used to smoke ~ 1 ppd    History  Alcohol Use  . Yes    Comment: socially    Family History  Problem Relation Age of Onset  . Colon cancer Neg Hx   . Diabetes Neg Hx   . Breast cancer Sister   . Hypertension Sister   . Coronary artery disease Other     F, brothers x 2other family members  . Heart failure Mother   . Prostate cancer Brother   . Heart disease Father     Review of Systems: As noted in history of present illness.  All other  systems were reviewed and are negative.  Physical Exam: BP (!) 146/68   Pulse 86   Ht 5\' 5"  (1.651 m)   Wt 125 lb 6.4 oz (56.9 kg)   BMI 20.87 kg/m  She is very pleasant and in no distress. The HEENT exam is normal. The carotids are 2+ without bruits.  There is no thyromegaly.  There is no JVD.  The lungs are clear.  Her pacemaker site is normal. The heart exam reveals a regular rate with a normal S1 and S2.  There are no murmurs, gallops, or rubs.  The PMI is not displaced.       Exam of the legs reveal no clubbing, cyanosis, or edema.    The distal pulses are intact.  Cranial nerves II - XII are intact.  Motor and sensory functions are intact.  The gait is normal.  LABORATORY DATA: Lab Results  Component Value Date   WBC 6.6 01/23/2015   HGB 13.6 01/23/2015   HCT 40.9 01/23/2015   PLT 244.0 01/23/2015   GLUCOSE 105 (H)  01/23/2015   CHOL 186 01/23/2015   TRIG 47.0 01/23/2015   HDL 86.60 01/23/2015   LDLDIRECT 105.8 09/07/2012   LDLCALC 90 01/23/2015   ALT 20 01/23/2015   AST 25 01/23/2015   NA 141 01/23/2015   K 5.1 01/23/2015   CL 105 01/23/2015   CREATININE 0.76 01/23/2015   BUN 18 01/23/2015   CO2 30 01/23/2015   TSH 2.12 09/07/2012     Assessment / Plan: 1. Paroxysmal atrial fibrillation. Patient is asymptomatic.  Continue on Apixaban 2.5 mg twice a day.  2. Mobitz type 2 second degree AV block status post pacemaker implant. Keep follow up in pacemaker clinic.  3. Hypertension, controlled. Cough resolved on losartan  4. Hyperlipidemia. Adequate control on lipitor.  Follow up in 6 months.

## 2015-11-02 ENCOUNTER — Ambulatory Visit (INDEPENDENT_AMBULATORY_CARE_PROVIDER_SITE_OTHER): Payer: Medicare Other | Admitting: Cardiology

## 2015-11-02 ENCOUNTER — Encounter: Payer: Self-pay | Admitting: Cardiology

## 2015-11-02 VITALS — BP 146/68 | HR 86 | Ht 65.0 in | Wt 125.4 lb

## 2015-11-02 DIAGNOSIS — I441 Atrioventricular block, second degree: Secondary | ICD-10-CM

## 2015-11-02 DIAGNOSIS — I10 Essential (primary) hypertension: Secondary | ICD-10-CM

## 2015-11-02 DIAGNOSIS — E7800 Pure hypercholesterolemia, unspecified: Secondary | ICD-10-CM

## 2015-11-02 DIAGNOSIS — I480 Paroxysmal atrial fibrillation: Secondary | ICD-10-CM | POA: Diagnosis not present

## 2015-11-02 NOTE — Patient Instructions (Addendum)
Continue your current therapy  I will see you  In 6 months   

## 2015-11-03 HISTORY — PX: BREAST BIOPSY: SHX20

## 2015-11-16 ENCOUNTER — Telehealth: Payer: Self-pay | Admitting: Cardiology

## 2015-11-16 ENCOUNTER — Ambulatory Visit (INDEPENDENT_AMBULATORY_CARE_PROVIDER_SITE_OTHER): Payer: Medicare Other | Admitting: *Deleted

## 2015-11-16 DIAGNOSIS — I442 Atrioventricular block, complete: Secondary | ICD-10-CM

## 2015-11-16 NOTE — Telephone Encounter (Signed)
Spoke w/ pt and informed her that her battery has reached ERI. Explained to her what this means. Informed her that a scheduler will call to schedule an appt w/ MD / NP / PA and the procedure will be scheduled for another day. Pt verbalized understanding.

## 2015-11-16 NOTE — Progress Notes (Signed)
Remote pacemaker transmission.   

## 2015-11-20 ENCOUNTER — Encounter: Payer: Self-pay | Admitting: Physician Assistant

## 2015-11-22 NOTE — Progress Notes (Signed)
Cardiology Office Note Date:  11/23/2015  Patient ID:  Bruna Potter [place], DOB [birth year], MRN [id number] PCP:  Kathlene November, MD  Cardiologist:  Dr. Martinique Electrophysiologist:  Dr. Rayann Heman   Chief Complaint: PPM reached ERI  History of Present Illness: D J Mestre is a 82 y.o. female with history of PAfib, CHB w/PPM, chronic knee/back pain, known cough with ACE-I, HTN, HLD, comes to the office today to be seen for Dr. Rayann Heman, lkast seen by EP service, Chanetta Marshall 05/20/15, at that time doing well.  She reports today feeling quite well.  Denies any kind of CP, palpitations or SOB, no dizziness, near syncope or syncope.   She denies any bleedin g or signs of bleeding on the Eliquis.   Device History: MDT dual chamber PPM implanted 2008 for intermittent complete heart block  Past Medical History:  Diagnosis Date  . Cataract    REMOVED  . Complete heart block (HCC)    a. s/p MDT dual chamber PPM   . History of colonic polyps   . Hyperlipidemia   . Hypertension   . Osteopenia   . Paroxysmal atrial fibrillation (HCC)   . Skin cancer 2010   Left leg, SCC, sees derm  . Syncope 11/2005    Past Surgical History:  Procedure Laterality Date  . APPENDECTOMY    . BREAST BIOPSY Bilateral    several, 11/2014 showed fibrocystic changes, no evidence of malignancy  . BREAST BIOPSY Right 11-2015  . cataracts Bilateral   . COLONOSCOPY W/ BIOPSIES    . HEMORRHOID SURGERY    . PPM  2008   MDT dual chamber PPM implanted by Dr Verlon Setting for intermittent CHB  . TOTAL ABDOMINAL HYSTERECTOMY     oophorectomy  . US ECHOCARDIOGRAPHY  12/08/2005   EF 55-60%    Current Outpatient Prescriptions  Medication Sig Dispense Refill  . apixaban (ELIQUIS) 2.5 MG TABS tablet Take 1 tablet (2.5 mg total) by mouth 2 (two) times daily. 180 tablet 3  . atorvastatin (LIPITOR) 20 MG tablet Take 1 tablet (20 mg total) by mouth daily. 90 tablet 2  . Calcium Carbonate-Vitamin D (CALCIUM 600/VITAMIN D PO) Take 1 capsule by mouth  daily.    Marland Kitchen losartan (COZAAR) 50 MG tablet Take 1 tablet (50 mg total) by mouth daily. 90 tablet 3  . multivitamin (THERAGRAN) per tablet Take 1 tablet by mouth daily.      . Omega-3 Fatty Acids (FISH OIL) 1200 MG CAPS Take one tablet by mouth daily     No current facility-administered medications for this visit.     Allergies:   Anesthetics, ester and Lisinopril   Social History:  The patient  reports that she quit smoking about 27 years ago. Her smoking use included Cigarettes. She has never used smokeless tobacco. She reports that she drinks alcohol. She reports that she does not use drugs.   Family History:  The patient's family history includes Breast cancer in her sister; Coronary artery disease in her other; Heart disease in her father; Heart failure in her mother; Hypertension in her sister; Prostate cancer in her brother.  ROS:  Please see the history of present illness.   All other systems are reviewed and otherwise negative.   PHYSICAL EXAM:  VS:  BP (!) 142/72   Pulse 88   Ht 5\' 5"  (1.651 m)   Wt 125 lb (56.7 kg)   BMI 20.80 kg/m  BMI: Body mass index is 20.8 kg/m. Well nourished, well developed, in  no acute distress  HEENT: normocephalic, atraumatic  Neck: no JVD, carotid bruits or masses Cardiac:  normal S1, S2; RRR; no significant murmurs, no rubs, or gallops Lungs:  clear to auscultation bilaterally, no wheezing, rhonchi or rales  Abd: soft, nontender MS: no deformity or atrophy Ext: no edema  Skin: warm and dry, no rash Neuro:  No gross deficits appreciated Psych: euthymic mood, full affect  PPM site is stable, no tethering or discomfort   EKG:  Done today and reviewed by myself shows SR. 1st degree AVblock PPM interrogation today: device reached ERI 11/11/15, 99.8% V sensed  Recent Labs: 01/23/2015: ALT 20; BUN 18; Creatinine, Ser 0.76; Hemoglobin 13.6; Platelets 244.0; Potassium 5.1; Sodium 141  01/23/2015: Cholesterol 186; HDL 86.60; LDL Cholesterol 90;  Total CHOL/HDL Ratio 2; Triglycerides 47.0; VLDL 9.4   CrCl cannot be calculated (Patient's most recent lab result is older than the maximum 21 days allowed.).   Wt Readings from Last 3 Encounters:  11/23/15 125 lb (56.7 kg)  11/02/15 125 lb 6.4 oz (56.9 kg)  05/20/15 124 lb (56.2 kg)     Other studies reviewed: Additional studies/records reviewed today include: summarized above   ASSESSMENT AND PLAN:  1.  Complete heart block w/PPM      device has reached ERI      Discussed generator change procedure with the patient, risks/benefits, she wishes to proceed  2.  Paroxysmal atrial fibrillation     Continue Eliquis for CHADS2VASC of 4  3.  HTN Stable No change required today   We will schedule her for PPM generator change, BMET and CBC today, hold her Eliquis the evening prior and AM of the procedure.  Disposition: F/u with 10 day wound check post gen change and 3 month follow up with Dr. Rayann Heman  Current medicines are reviewed at length with the patient today.  The patient did not have any concerns regarding medicines.  Haywood Lasso, PA-C 11/23/2015 3:35 PM     [place] [address] [phone] (office)  [phone] (fax)

## 2015-11-23 ENCOUNTER — Ambulatory Visit (INDEPENDENT_AMBULATORY_CARE_PROVIDER_SITE_OTHER): Payer: Medicare Other | Admitting: Physician Assistant

## 2015-11-23 ENCOUNTER — Other Ambulatory Visit: Payer: Self-pay | Admitting: Physician Assistant

## 2015-11-23 ENCOUNTER — Encounter: Payer: Self-pay | Admitting: Physician Assistant

## 2015-11-23 ENCOUNTER — Encounter: Payer: Self-pay | Admitting: *Deleted

## 2015-11-23 VITALS — BP 142/72 | HR 88 | Ht 65.0 in | Wt 125.0 lb

## 2015-11-23 DIAGNOSIS — Z01818 Encounter for other preprocedural examination: Secondary | ICD-10-CM | POA: Diagnosis not present

## 2015-11-23 DIAGNOSIS — Z45010 Encounter for checking and testing of cardiac pacemaker pulse generator [battery]: Secondary | ICD-10-CM | POA: Diagnosis not present

## 2015-11-23 DIAGNOSIS — I10 Essential (primary) hypertension: Secondary | ICD-10-CM

## 2015-11-23 DIAGNOSIS — I480 Paroxysmal atrial fibrillation: Secondary | ICD-10-CM | POA: Diagnosis not present

## 2015-11-23 LAB — CBC
HCT: 40.4 % (ref 35.0–45.0)
Hemoglobin: 13.5 g/dL (ref 11.7–15.5)
MCH: 28.8 pg (ref 27.0–33.0)
MCHC: 33.4 g/dL (ref 32.0–36.0)
MCV: 86.3 fL (ref 80.0–100.0)
MPV: 9.5 fL (ref 7.5–12.5)
PLATELETS: 254 10*3/uL (ref 140–400)
RBC: 4.68 MIL/uL (ref 3.80–5.10)
RDW: 14.1 % (ref 11.0–15.0)
WBC: 7.2 10*3/uL (ref 3.8–10.8)

## 2015-11-23 LAB — PROTIME-INR
INR: 1
PROTHROMBIN TIME: 10.7 s (ref 9.0–11.5)

## 2015-11-23 NOTE — Patient Instructions (Addendum)
Medication Instructions:   Your physician recommends that you continue on your current medications as directed. Please refer to the Current Medication list given to you today.   If you need a refill on your cardiac medications before your next appointment, please call your pharmacy.  Labwork: BMET ,CBC, AND PT/INR     Testing/Procedures:  SEE LETTER FOR GEN CHANGE    Follow-Up: AFTER  11/27/2015..10 DAY POST WOUND CHECK                                      AFTER   11/27/2015  [place] Rayann Heman   Any Other Special Instructions Will Be Listed Below (If Applicable).

## 2015-11-24 LAB — CUP PACEART REMOTE DEVICE CHECK
Battery Impedance: 6396 Ohm
Battery Voltage: 2.62 V
Brady Statistic RV Percent Paced: 0 %
Implantable Lead Implant Date: 20080318
Implantable Lead Location: 753860
Implantable Lead Model: 5076
Lead Channel Setting Sensing Sensitivity: 5.6 mV
MDC IDC LEAD IMPLANT DT: 20080318
MDC IDC LEAD LOCATION: 753859
MDC IDC MSMT LEADCHNL RA IMPEDANCE VALUE: 67 Ohm
MDC IDC MSMT LEADCHNL RV IMPEDANCE VALUE: 455 Ohm
MDC IDC SESS DTM: 20170814112858
MDC IDC SET LEADCHNL RV PACING AMPLITUDE: 2.5 V
MDC IDC SET LEADCHNL RV PACING PULSEWIDTH: 0.4 ms

## 2015-11-24 LAB — BASIC METABOLIC PANEL
BUN: 19 mg/dL (ref 7–25)
CALCIUM: 9.6 mg/dL (ref 8.6–10.4)
CO2: 27 mmol/L (ref 20–31)
CREATININE: 0.76 mg/dL (ref 0.60–0.88)
Chloride: 105 mmol/L (ref 98–110)
Glucose, Bld: 92 mg/dL (ref 65–99)
Potassium: 4.3 mmol/L (ref 3.5–5.3)
Sodium: 140 mmol/L (ref 135–146)

## 2015-11-25 ENCOUNTER — Telehealth: Payer: Self-pay | Admitting: *Deleted

## 2015-11-25 NOTE — Telephone Encounter (Signed)
SPOKE TO PT  ABOUT RESULTS AND VERBALIZED Peggy Mendez

## 2015-11-25 NOTE — Telephone Encounter (Signed)
-----   Message from [place], [place] sent at 11/24/2015  6:32 AM EDT ----- Please let the patient know her labs look good.  Thanks [employer]

## 2015-11-25 NOTE — Telephone Encounter (Signed)
LMOVM TO CALL BACK FOR RESULTS ALSO CALLED SISTER BUT NON VOICEMAIL TO LEAVE MESSAGE

## 2015-11-27 ENCOUNTER — Ambulatory Visit (HOSPITAL_COMMUNITY)
Admission: RE | Admit: 2015-11-27 | Discharge: 2015-11-27 | Disposition: A | Discharge status: Home / Self Care | Payer: Medicare Other | Source: Ambulatory Visit | Attending: Internal Medicine | Admitting: Internal Medicine

## 2015-11-27 ENCOUNTER — Encounter (HOSPITAL_COMMUNITY)
Admission: RE | Disposition: A | Discharge status: Home / Self Care | Payer: Self-pay | Source: Ambulatory Visit | Attending: Internal Medicine

## 2015-11-27 DIAGNOSIS — I480 Paroxysmal atrial fibrillation: Secondary | ICD-10-CM | POA: Diagnosis not present

## 2015-11-27 DIAGNOSIS — Z87891 Personal history of nicotine dependence: Secondary | ICD-10-CM | POA: Diagnosis not present

## 2015-11-27 DIAGNOSIS — Z85828 Personal history of other malignant neoplasm of skin: Secondary | ICD-10-CM | POA: Diagnosis not present

## 2015-11-27 DIAGNOSIS — I495 Sick sinus syndrome: Secondary | ICD-10-CM | POA: Insufficient documentation

## 2015-11-27 DIAGNOSIS — E785 Hyperlipidemia, unspecified: Secondary | ICD-10-CM | POA: Diagnosis not present

## 2015-11-27 DIAGNOSIS — Z45010 Encounter for checking and testing of cardiac pacemaker pulse generator [battery]: Secondary | ICD-10-CM | POA: Insufficient documentation

## 2015-11-27 DIAGNOSIS — I10 Essential (primary) hypertension: Secondary | ICD-10-CM | POA: Diagnosis not present

## 2015-11-27 DIAGNOSIS — Z7901 Long term (current) use of anticoagulants: Secondary | ICD-10-CM | POA: Insufficient documentation

## 2015-11-27 DIAGNOSIS — I442 Atrioventricular block, complete: Secondary | ICD-10-CM | POA: Insufficient documentation

## 2015-11-27 HISTORY — PX: EP IMPLANTABLE DEVICE: SHX172B

## 2015-11-27 LAB — SURGICAL PCR SCREEN
MRSA, PCR: NEGATIVE
Staphylococcus aureus: NEGATIVE

## 2015-11-27 SURGERY — PPM/BIV PPM GENERATOR CHANGEOUT
Anesthesia: LOCAL

## 2015-11-27 MED ORDER — CEFAZOLIN SODIUM-DEXTROSE 2-4 GM/100ML-% IV SOLN
2.0000 g | INTRAVENOUS | Status: AC
  Administered 2015-11-27: 2 g via INTRAVENOUS

## 2015-11-27 MED ORDER — SODIUM CHLORIDE 0.9 % IV SOLN: 250.0000 mL | INTRAVENOUS | Status: DC | PRN

## 2015-11-27 MED ORDER — CEFAZOLIN SODIUM-DEXTROSE 2-4 GM/100ML-% IV SOLN
INTRAVENOUS | Status: AC
  Filled 2015-11-27: qty 100

## 2015-11-27 MED ORDER — MUPIROCIN 2 % EX OINT
TOPICAL_OINTMENT | CUTANEOUS | Status: AC
  Administered 2015-11-27: 1 via TOPICAL
  Filled 2015-11-27: qty 22

## 2015-11-27 MED ORDER — SODIUM CHLORIDE 0.9 % IR SOLN
80.0000 mg | Status: AC
  Administered 2015-11-27: 80 mg

## 2015-11-27 MED ORDER — CHLORHEXIDINE GLUCONATE 4 % EX LIQD: 60.0000 mL | Freq: Once | CUTANEOUS | Status: DC

## 2015-11-27 MED ORDER — SODIUM CHLORIDE 0.9 % IV SOLN
INTRAVENOUS | Status: DC
  Administered 2015-11-27: 11:00:00 via INTRAVENOUS

## 2015-11-27 MED ORDER — LIDOCAINE HCL (PF) 1 % IJ SOLN
INTRAMUSCULAR | Status: AC
  Filled 2015-11-27: qty 60

## 2015-11-27 MED ORDER — LIDOCAINE HCL (PF) 1 % IJ SOLN
INTRAMUSCULAR | Status: DC | PRN
  Administered 2015-11-27: 50 mL via INTRADERMAL

## 2015-11-27 MED ORDER — ACETAMINOPHEN 325 MG PO TABS: 325.0000 mg | ORAL_TABLET | ORAL | Status: DC | PRN

## 2015-11-27 MED ORDER — MUPIROCIN 2 % EX OINT
1.0000 "application " | TOPICAL_OINTMENT | Freq: Once | CUTANEOUS | Status: AC
  Administered 2015-11-27: 1 via TOPICAL

## 2015-11-27 MED ORDER — ONDANSETRON HCL 4 MG/2ML IJ SOLN: 4.0000 mg | Freq: Four times a day (QID) | INTRAMUSCULAR | Status: DC | PRN

## 2015-11-27 MED ORDER — SODIUM CHLORIDE 0.9% FLUSH: 3.0000 mL | Freq: Two times a day (BID) | INTRAVENOUS | Status: DC

## 2015-11-27 MED ORDER — APIXABAN 2.5 MG PO TABS: 2.5000 mg | ORAL_TABLET | Freq: Two times a day (BID) | ORAL | 3 refills | Status: DC

## 2015-11-27 MED ORDER — SODIUM CHLORIDE 0.9% FLUSH: 3.0000 mL | INTRAVENOUS | Status: DC | PRN

## 2015-11-27 MED ORDER — SODIUM CHLORIDE 0.9 % IR SOLN
Status: AC
  Filled 2015-11-27: qty 2

## 2015-11-27 SURGICAL SUPPLY — 5 items
CABLE SURGICAL S-101-97-12 (CABLE) ×2 IMPLANT
PACEMAKER ADAPTA DR ADDRL1 (Pacemaker) ×1 IMPLANT
PAD DEFIB LIFELINK (PAD) ×2 IMPLANT
PPM ADAPTA DR ADDRL1 (Pacemaker) ×2 IMPLANT
TRAY PACEMAKER INSERTION (PACKS) ×2 IMPLANT

## 2015-11-27 NOTE — Interval H&P Note (Signed)
History and Physical Interval Note:  11/27/2015 11:24 AM  Peggy Mendez  has presented today for surgery, with the diagnosis of eri  The various methods of treatment have been discussed with the patient and family. After consideration of risks, benefits and other options for treatment, the patient has consented to  Procedure(s): PPM Generator Changeout (N/A) as a surgical intervention .  The patient's history has been reviewed, patient examined, no change in status, stable for surgery.  I have reviewed the patient's chart and labs.  Questions were answered to the patient's satisfaction.    Risks, benefits, and alternatives to pacemaker pulse generator replacement were discussed in detail today.  The patient understands that risks include but are not limited to bleeding, infection, pneumothorax, perforation, tamponade, vascular damage, renal failure, MI, stroke, death, damage to his existing leads, and lead dislodgement and wishes to proceed.      Thompson Grayer

## 2015-11-27 NOTE — Discharge Instructions (Signed)

## 2015-11-27 NOTE — H&P (View-Only) (Signed)
Cardiology Office Note Date:  11/23/2015  Patient ID:  Peggy Mendez, DOB 05/24/1926, MRN [id number] PCP:  Kathlene November, MD  Cardiologist:  Dr. Martinique Electrophysiologist:  Dr. Rayann Heman   Chief Complaint: PPM reached ERI  History of Present Illness: Peggy Mendez is a 82 y.o. female with history of PAfib, CHB w/PPM, chronic knee/back pain, known cough with ACE-I, HTN, HLD, comes to the office today to be seen for Dr. Rayann Heman, lkast seen by EP service, Chanetta Marshall 05/20/15, at that time doing well.  She reports today feeling quite well.  Denies any kind of CP, palpitations or SOB, no dizziness, near syncope or syncope.   She denies any bleedin g or signs of bleeding on the Eliquis.   Device History: MDT dual chamber PPM implanted 2008 for intermittent complete heart block  Past Medical History:  Diagnosis Date  . Cataract    REMOVED  . Complete heart block (HCC)    a. s/p MDT dual chamber PPM   . History of colonic polyps   . Hyperlipidemia   . Hypertension   . Osteopenia   . Paroxysmal atrial fibrillation (HCC)   . Skin cancer 2010   Left leg, SCC, sees derm  . Syncope 11/2005    Past Surgical History:  Procedure Laterality Date  . APPENDECTOMY    . BREAST BIOPSY Bilateral    several, 11/2014 showed fibrocystic changes, no evidence of malignancy  . BREAST BIOPSY Right 11-2015  . cataracts Bilateral   . COLONOSCOPY W/ BIOPSIES    . HEMORRHOID SURGERY    . PPM  2008   MDT dual chamber PPM implanted by Dr Verlon Setting for intermittent CHB  . TOTAL ABDOMINAL HYSTERECTOMY     oophorectomy  . US ECHOCARDIOGRAPHY  12/08/2005   EF 55-60%    Current Outpatient Prescriptions  Medication Sig Dispense Refill  . apixaban (ELIQUIS) 2.5 MG TABS tablet Take 1 tablet (2.5 mg total) by mouth 2 (two) times daily. 180 tablet 3  . atorvastatin (LIPITOR) 20 MG tablet Take 1 tablet (20 mg total) by mouth daily. 90 tablet 2  . Calcium Carbonate-Vitamin Peggy (CALCIUM 600/VITAMIN Peggy PO) Take 1 capsule by mouth  daily.    Marland Kitchen losartan (COZAAR) 50 MG tablet Take 1 tablet (50 mg total) by mouth daily. 90 tablet 3  . multivitamin (THERAGRAN) per tablet Take 1 tablet by mouth daily.      . Omega-3 Fatty Acids (FISH OIL) 1200 MG CAPS Take one tablet by mouth daily     No current facility-administered medications for this visit.     Allergies:   Anesthetics, ester and Lisinopril   Social History:  The patient  reports that she quit smoking about 27 years ago. Her smoking use included Cigarettes. She has never used smokeless tobacco. She reports that she drinks alcohol. She reports that she does not use drugs.   Family History:  The patient's family history includes Breast cancer in her sister; Coronary artery disease in her other; Heart disease in her father; Heart failure in her mother; Hypertension in her sister; Prostate cancer in her brother.  ROS:  Please see the history of present illness.   All other systems are reviewed and otherwise negative.   PHYSICAL EXAM:  VS:  BP (!) 142/72   Pulse 88   Ht 5\' 5"  (1.651 m)   Wt 125 lb (56.7 kg)   BMI 20.80 kg/m  BMI: Body mass index is 20.8 kg/m. Well nourished, well developed, in  no acute distress  HEENT: normocephalic, atraumatic  Neck: no JVD, carotid bruits or masses Cardiac:  normal S1, S2; RRR; no significant murmurs, no rubs, or gallops Lungs:  clear to auscultation bilaterally, no wheezing, rhonchi or rales  Abd: soft, nontender MS: no deformity or atrophy Ext: no edema  Skin: warm and dry, no rash Neuro:  No gross deficits appreciated Psych: euthymic mood, full affect  PPM site is stable, no tethering or discomfort   EKG:  Done today and reviewed by myself shows SR. 1st degree AVblock PPM interrogation today: device reached ERI 11/11/15, 99.8% V sensed  Recent Labs: 01/23/2015: ALT 20; BUN 18; Creatinine, Ser 0.76; Hemoglobin 13.6; Platelets 244.0; Potassium 5.1; Sodium 141  01/23/2015: Cholesterol 186; HDL 86.60; LDL Cholesterol 90;  Total CHOL/HDL Ratio 2; Triglycerides 47.0; VLDL 9.4   CrCl cannot be calculated (Patient's most recent lab result is older than the maximum 21 days allowed.).   Wt Readings from Last 3 Encounters:  11/23/15 125 lb (56.7 kg)  11/02/15 125 lb 6.4 oz (56.9 kg)  05/20/15 124 lb (56.2 kg)     Other studies reviewed: Additional studies/records reviewed today include: summarized above   ASSESSMENT AND PLAN:  1.  Complete heart block w/PPM      device has reached ERI      Discussed generator change procedure with the patient, risks/benefits, she wishes to proceed  2.  Paroxysmal atrial fibrillation     Continue Eliquis for CHADS2VASC of 4  3.  HTN Stable No change required today   We will schedule her for PPM generator change, BMET and CBC today, hold her Eliquis the evening prior and AM of the procedure.  Disposition: F/u with 10 day wound check post gen change and 3 month follow up with Dr. Rayann Heman  Current medicines are reviewed at length with the patient today.  The patient did not have any concerns regarding medicines.  Haywood Lasso, PA-C 11/23/2015 3:35 PM     [place] [address] [phone] (office)  [phone] (fax)

## 2015-11-29 ENCOUNTER — Other Ambulatory Visit: Payer: Self-pay | Admitting: Internal Medicine

## 2015-11-29 ENCOUNTER — Other Ambulatory Visit: Payer: Self-pay | Admitting: Cardiology

## 2015-11-29 DIAGNOSIS — I480 Paroxysmal atrial fibrillation: Secondary | ICD-10-CM

## 2015-11-30 ENCOUNTER — Telehealth: Payer: Self-pay | Admitting: Internal Medicine

## 2015-11-30 ENCOUNTER — Encounter (HOSPITAL_COMMUNITY): Payer: Self-pay | Admitting: Internal Medicine

## 2015-11-30 NOTE — Telephone Encounter (Signed)
Called pt back. Pt stated that there was dry blood on the steri-strips informed pt that this was normal. Pt also stated that her skin had peeled from the tegaderm that was placed over steri-strips. Pt asked if she could apply some antibiotic ointment, informed pt that typically that we advise against ointments around the site. Pt voiced understanding. Pt stated that she would keep some clean gauze over the area and secured with paper tape. Informed pt that would be ok. Pt voiced understanding.

## 2015-11-30 NOTE — Telephone Encounter (Signed)
New Message:    Pt had pacemaker battery put in on Friday,she needs to ask some questions about her wound please.

## 2015-12-09 ENCOUNTER — Ambulatory Visit (INDEPENDENT_AMBULATORY_CARE_PROVIDER_SITE_OTHER): Payer: Medicare Other | Admitting: *Deleted

## 2015-12-09 ENCOUNTER — Telehealth: Payer: Self-pay | Admitting: *Deleted

## 2015-12-09 ENCOUNTER — Encounter: Payer: Self-pay | Admitting: Internal Medicine

## 2015-12-09 DIAGNOSIS — Z950 Presence of cardiac pacemaker: Secondary | ICD-10-CM

## 2015-12-09 LAB — CUP PACEART INCLINIC DEVICE CHECK
Battery Remaining Longevity: 173 mo
Battery Voltage: 2.8 V
Implantable Lead Implant Date: 20080318
Implantable Lead Location: 753859
Implantable Lead Model: 5076
Lead Channel Impedance Value: 487 Ohm
Lead Channel Pacing Threshold Pulse Width: 0.4 ms
Lead Channel Pacing Threshold Pulse Width: 0.4 ms
Lead Channel Setting Pacing Amplitude: 2 V
Lead Channel Setting Pacing Amplitude: 2.5 V
MDC IDC LEAD IMPLANT DT: 20080318
MDC IDC LEAD LOCATION: 753860
MDC IDC MSMT BATTERY IMPEDANCE: 100 Ohm
MDC IDC MSMT LEADCHNL RA PACING THRESHOLD AMPLITUDE: 0.5 V
MDC IDC MSMT LEADCHNL RA SENSING INTR AMPL: 4 mV
MDC IDC MSMT LEADCHNL RV IMPEDANCE VALUE: 612 Ohm
MDC IDC MSMT LEADCHNL RV PACING THRESHOLD AMPLITUDE: 0.75 V
MDC IDC MSMT LEADCHNL RV SENSING INTR AMPL: 15.67 mV
MDC IDC SESS DTM: 20170906175408
MDC IDC SET LEADCHNL RV PACING PULSEWIDTH: 0.4 ms
MDC IDC SET LEADCHNL RV SENSING SENSITIVITY: 5.6 mV
MDC IDC STAT BRADY AP VP PERCENT: 0 %
MDC IDC STAT BRADY AP VS PERCENT: 15 %
MDC IDC STAT BRADY AS VP PERCENT: 0 %
MDC IDC STAT BRADY AS VS PERCENT: 85 %

## 2015-12-09 NOTE — Telephone Encounter (Signed)
Spoke with patient and advised that I successfully ordered a return kit for her 2490 Carelink monitor.  Patient should expect kit within next 7-10 business days per tech services.  Patient verbalizes understanding and appreciation.  She denies additional questions or concerns at this time. 

## 2015-12-09 NOTE — Progress Notes (Signed)
Wound check appointment, s/p generator changeout on 11/27/15. Steri-strips removed. Wound without redness or edema. Incision edges approximated, wound well healed. Normal device function. Thresholds, sensing, and impedances consistent with implant measurements. Device programmed at chronic outputs; RA min output increased to 2.0V and RV min output increased to 2.5V. Histogram distribution appropriate for patient and level of activity. 4 mode switches (<0.1%), +Eliquis, no EGMs as all episodes <30 sec duration. No high ventricular rates noted. Patient educated about wound care, arm mobility, lifting restrictions. ROV with JA on 02/29/16.

## 2015-12-14 ENCOUNTER — Telehealth: Payer: Self-pay | Admitting: Internal Medicine

## 2015-12-14 NOTE — Telephone Encounter (Signed)
Called pt back, she stated that she could feel a stitch, pt agreeable to come into the device clinic at 4pm tomorrow.

## 2015-12-14 NOTE — Telephone Encounter (Signed)
New message   Pt verbalized that her dissolvable stitches have not completely went away and she wants rn to call her to see about having it removed

## 2015-12-15 ENCOUNTER — Ambulatory Visit: Payer: Medicare Other | Admitting: *Deleted

## 2015-12-15 DIAGNOSIS — Z950 Presence of cardiac pacemaker: Secondary | ICD-10-CM

## 2015-12-15 NOTE — Progress Notes (Signed)
Small stitch removed from incision. Incision well healed. Pt voiced understanding if she she sees anymore stitches to call the office.

## 2016-01-28 ENCOUNTER — Encounter: Payer: Medicare Other | Admitting: Internal Medicine

## 2016-02-04 ENCOUNTER — Telehealth: Payer: Self-pay

## 2016-02-04 NOTE — Telephone Encounter (Signed)
Patient called requesting prior authorization for Eliquis.[employer] called [phone]. PA obtained for Eliqius.

## 2016-02-09 ENCOUNTER — Encounter: Payer: Self-pay | Admitting: Internal Medicine

## 2016-02-09 ENCOUNTER — Ambulatory Visit (INDEPENDENT_AMBULATORY_CARE_PROVIDER_SITE_OTHER): Payer: Medicare Other | Admitting: Internal Medicine

## 2016-02-09 VITALS — BP 122/72 | HR 92 | Temp 98.1°F | Resp 12 | Ht 65.0 in | Wt 124.0 lb

## 2016-02-09 DIAGNOSIS — I10 Essential (primary) hypertension: Secondary | ICD-10-CM | POA: Diagnosis not present

## 2016-02-09 DIAGNOSIS — Z0000 Encounter for general adult medical examination without abnormal findings: Secondary | ICD-10-CM | POA: Diagnosis not present

## 2016-02-09 DIAGNOSIS — I4891 Unspecified atrial fibrillation: Secondary | ICD-10-CM

## 2016-02-09 DIAGNOSIS — E785 Hyperlipidemia, unspecified: Secondary | ICD-10-CM

## 2016-02-09 LAB — CBC WITH DIFFERENTIAL/PLATELET
BASOS ABS: 0 10*3/uL (ref 0.0–0.1)
Basophils Relative: 0.4 % (ref 0.0–3.0)
EOS ABS: 0.1 10*3/uL (ref 0.0–0.7)
Eosinophils Relative: 1.6 % (ref 0.0–5.0)
HCT: 40.6 % (ref 36.0–46.0)
Hemoglobin: 13.6 g/dL (ref 12.0–15.0)
LYMPHS ABS: 2.4 10*3/uL (ref 0.7–4.0)
Lymphocytes Relative: 35.1 % (ref 12.0–46.0)
MCHC: 33.5 g/dL (ref 30.0–36.0)
MCV: 84.9 fl (ref 78.0–100.0)
MONO ABS: 0.8 10*3/uL (ref 0.1–1.0)
Monocytes Relative: 11.1 % (ref 3.0–12.0)
NEUTROS PCT: 51.8 % (ref 43.0–77.0)
Neutro Abs: 3.6 10*3/uL (ref 1.4–7.7)
Platelets: 246 10*3/uL (ref 150.0–400.0)
RBC: 4.78 Mil/uL (ref 3.87–5.11)
RDW: 13.6 % (ref 11.5–15.5)
WBC: 7 10*3/uL (ref 4.0–10.5)

## 2016-02-09 LAB — BASIC METABOLIC PANEL
BUN: 17 mg/dL (ref 6–23)
CHLORIDE: 102 meq/L (ref 96–112)
CO2: 31 meq/L (ref 19–32)
CREATININE: 0.76 mg/dL (ref 0.40–1.20)
Calcium: 9.8 mg/dL (ref 8.4–10.5)
GFR: 76.44 mL/min (ref >60.00)
Glucose, Bld: 96 mg/dL (ref 70–99)
Potassium: 4.5 mEq/L (ref 3.5–5.1)
Sodium: 138 mEq/L (ref 135–145)

## 2016-02-09 LAB — LIPID PANEL
CHOL/HDL RATIO: 2
Cholesterol: 199 mg/dL (ref 0–200)
HDL: 94.5 mg/dL (ref >39.00)
LDL CALC: 92 mg/dL (ref 0–99)
NONHDL: 104.42
Triglycerides: 61 mg/dL (ref 0.0–149.0)
VLDL: 12.2 mg/dL (ref 0.0–40.0)

## 2016-02-09 LAB — AST: AST: 26 U/L (ref 0–37)

## 2016-02-09 LAB — ALT: ALT: 22 U/L (ref 0–35)

## 2016-02-09 NOTE — Assessment & Plan Note (Addendum)
Td  2016;  Had a shingles shot 2011 ;  Pneumonia shot 2005;  prevnar--2015 Had a flu shot no further screening for cervical ca h/o abnormal MMG and breast Bx; breast bx neg 11-2014; next MMG 05-2016 [facility]-- Cscope 8-09, 2 polyps,  Cscope 8-14, + polyps, no further scopes  Abd u/s 2013 (-) for AAA  She is doing very well with diet, has not been able to exercise as much due to knee pain however plans to start exercising at the pool or without murmur, and bike. Labs: BMP, CBC, FLP, AST, ALT.

## 2016-02-09 NOTE — Assessment & Plan Note (Signed)
HTN: Continue losartan, checking a BMP. Hyperlipidemia: Continue Lipitor, check an FLP, AST, ALT Atrial fibrillation: Anticoagulated, no evidence of bleeding. Check a CBC. Continue Eliquis. Osteopenia: Calcium, vitamin D and exercise. Vitamin D levels have been normal.  RTC one year

## 2016-02-09 NOTE — Patient Instructions (Signed)
Get your blood work before you leave   Next visit 1 year     Fall Prevention and [place] cause injuries and can affect all age groups. It is possible to use preventive measures to significantly decrease the likelihood of falls. There are many simple measures which can make your home safer and prevent falls. OUTDOORS  Repair cracks and edges of walkways and driveways.  Remove high doorway thresholds.  Trim shrubbery on the main path into your home.  Have good outside lighting.  Clear walkways of tools, rocks, debris, and clutter.  Check that handrails are not broken and are securely fastened. Both sides of steps should have handrails.  Have leaves, snow, and ice cleared regularly.  Use sand or salt on walkways during winter months.  In the garage, clean up grease or oil spills. BATHROOM  Install night lights.  Install grab bars by the toilet and in the tub and shower.  Use non-skid mats or decals in the tub or shower.  Place a plastic non-slip stool in the shower to sit on, if needed.  Keep floors dry and clean up all water on the floor immediately.  Remove soap buildup in the tub or shower on a regular basis.  Secure bath mats with non-slip, double-sided rug tape.  Remove throw rugs and tripping hazards from the floors. BEDROOMS  Install night lights.  Make sure a bedside light is easy to reach.  Do not use oversized bedding.  Keep a telephone by your bedside.  Have a firm chair with side arms to use for getting dressed.  Remove throw rugs and tripping hazards from the floor. KITCHEN  Keep handles on pots and pans turned toward the center of the stove. Use back burners when possible.  Clean up spills quickly and allow time for drying.  Avoid walking on wet floors.  Avoid hot utensils and knives.  Position shelves so they are not too high or low.  Place commonly used objects within easy reach.  If necessary, use a sturdy step stool with  a grab bar when reaching.  Keep electrical cables out of the way.  Do not use floor polish or wax that makes floors slippery. If you must use wax, use non-skid floor wax.  Remove throw rugs and tripping hazards from the floor. STAIRWAYS  Never leave objects on stairs.  Place handrails on both sides of stairways and use them. Fix any loose handrails. Make sure handrails on both sides of the stairways are as long as the stairs.  Check carpeting to make sure it is firmly attached along stairs. Make repairs to worn or loose carpet promptly.  Avoid placing throw rugs at the top or bottom of stairways, or properly secure the rug with carpet tape to prevent slippage. Get rid of throw rugs, if possible.  Have an electrician put in a light switch at the top and bottom of the stairs. OTHER FALL PREVENTION TIPS  Wear low-heel or rubber-soled shoes that are supportive and fit well. Wear closed toe shoes.  When using a stepladder, make sure it is fully opened and both spreaders are firmly locked. Do not climb a closed stepladder.  Add color or contrast paint or tape to grab bars and handrails in your home. Place contrasting color strips on first and last steps.  Learn and use mobility aids as needed. Install an electrical emergency response system.  Turn on lights to avoid dark areas. Replace light bulbs that burn out immediately. Get  light switches that glow.  Arrange furniture to create clear pathways. Keep furniture in the same place.  Firmly attach carpet with non-skid or double-sided tape.  Eliminate uneven floor surfaces.  Select a carpet pattern that does not visually hide the edge of steps.  Be aware of all pets. OTHER HOME SAFETY TIPS  Set the water temperature for 120 F (48.8 C).  Keep emergency numbers on or near the telephone.  Keep smoke detectors on every level of the home and near sleeping areas. Document Released: 03/11/2002 Document Revised: 09/20/2011 Document  Reviewed: 06/10/2011 [facility] Patient Information 2015 [place], [place]. This information is not intended to replace advice given to you by your health care provider. Make sure you discuss any questions you have with your health care provider.   Preventive Care for Adults Ages 57 and over  Blood pressure check.** / Every 1 to 2 years.  Lipid and cholesterol check.**/ Every 5 years beginning at age 62.  Lung cancer screening. / Every year if you are aged 75-80 years and have a 30-pack-year history of smoking and currently smoke or have quit within the past 15 years. Yearly screening is stopped once you have quit smoking for at least 15 years or develop a health problem that would prevent you from having lung cancer treatment.  Fecal occult blood test (FOBT) of stool. / Every year beginning at age 50 and continuing until age 24. You may not have to do this test if you get a colonoscopy every 10 years.  Flexible sigmoidoscopy** or colonoscopy.** / Every 5 years for a flexible sigmoidoscopy or every 10 years for a colonoscopy beginning at age 79 and continuing until age 37.  Hepatitis C blood test.** / For all people born from [birth year] through 1963 and any individual with known risks for hepatitis C.  Abdominal aortic aneurysm (AAA) screening.** / A one-time screening for ages 59 to 43 years who are current or former smokers.  Skin self-exam. / Monthly.  Influenza vaccine. / Every year.  Tetanus, diphtheria, and acellular pertussis (Tdap/Td) vaccine.** / 1 dose of Td every 10 years.  Varicella vaccine.** / Consult your health care provider.  Zoster vaccine.** / 1 dose for adults aged 42 years or older.  Pneumococcal 13-valent conjugate (PCV13) vaccine.** / Consult your health care provider.  Pneumococcal polysaccharide (PPSV23) vaccine.** / 1 dose for all adults aged 40 years and older.  Meningococcal vaccine.** / Consult your health care provider.  Hepatitis A vaccine.** / Consult your  health care provider.  Hepatitis B vaccine.** / Consult your health care provider.  Haemophilus influenzae type b (Hib) vaccine.** / Consult your health care provider. **Family history and personal history of risk and conditions may change your health care provider's recommendations. Document Released: 05/17/2001 Document Revised: 03/26/2013 Document Reviewed: 08/16/2010 [facility] Patient Information 2015 [place], [place]. This information is not intended to replace advice given to you by your health care provider. Make sure you discuss any questions you have with your health care provider.

## 2016-02-09 NOTE — Progress Notes (Signed)
Pre visit review using our clinic review tool, if applicable. No additional management support is needed unless otherwise documented below in the visit note. 

## 2016-02-09 NOTE — Progress Notes (Signed)
Subjective:    Patient ID: Peggy Mendez, female    DOB: [birth year], 82 y.o.   MRN: [id number]  DOS:  02/09/2016 Type of visit - description : CPX Interval history: Complete heart block: Since the last time she was here, has seen cardiology several times, notes reviewed. Labs reviewed. Meds reviewed, good compliance without apparent side effects.     Review of Systems Constitutional: No fever. No chills. No unexplained wt changes. No unusual sweats  HEENT: No dental problems, no ear discharge, no facial swelling, no voice changes. No eye discharge, no eye  redness , no  intolerance to light   Respiratory: No wheezing , no  difficulty breathing. No cough , no mucus production  Cardiovascular: No CP, no leg swelling , no  Palpitations  GI: no nausea, no vomiting, no diarrhea , no  abdominal pain.  No blood in the stools. No dysphagia, no odynophagia    Endocrine: No polyphagia, no polyuria , no polydipsia  GU: No dysuria, gross hematuria, difficulty urinating. No urinary urgency, no frequency.  Musculoskeletal: No joint swellings or unusual aches or pains  Skin: No change in the color of the skin, palor , no  Rash  Allergic, immunologic: No environmental allergies , no  food allergies  Neurological: No dizziness no  syncope. No headaches. No diplopia, no slurred, no slurred speech, no motor deficits, no facial  Numbness  Hematological: No enlarged lymph nodes, no easy bruising , no unusual bleedings  Psychiatry: No suicidal ideas, no hallucinations, no beavior problems, no confusion.  No unusual/severe anxiety, no depression    Past Medical History:  Diagnosis Date  . Cataract    REMOVED  . Complete heart block (HCC)    a. s/p MDT dual chamber PPM   . History of colonic polyps   . Hyperlipidemia   . Hypertension   . Osteopenia   . Paroxysmal atrial fibrillation (HCC)   . Skin cancer 2010   Left leg, SCC, sees derm  . Syncope 11/2005    Past Surgical History:    Procedure Laterality Date  . APPENDECTOMY    . BREAST BIOPSY Bilateral    several, 11/2014 showed fibrocystic changes, no evidence of malignancy  . BREAST BIOPSY Right 11-2015  . cataracts Bilateral   . COLONOSCOPY W/ BIOPSIES    . EP IMPLANTABLE DEVICE N/A 11/27/2015   Procedure: PPM Generator Changeout;  Surgeon: Thompson Grayer, MD;  Location: [facility];  Service: Cardiovascular;  Laterality: N/A;  . HEMORRHOID SURGERY    . PPM  2008   MDT dual chamber PPM implanted by Dr Verlon Setting for intermittent CHB  . TOTAL ABDOMINAL HYSTERECTOMY     oophorectomy  . US ECHOCARDIOGRAPHY  12/08/2005   EF 55-60%    Social History   Social History  . Marital status: Single    Spouse name: N/A  . Number of children: 0  . Years of education: N/A   Occupational History  . retired  Retired   Social History Main Topics  . Smoking status: Former Smoker    Types: Cigarettes    Quit date: 08/23/1988  . Smokeless tobacco: Never Used     Comment: >20 yrs ago, used to smoke ~ 1 ppd  . Alcohol use Yes     Comment: socially  . Drug use: No  . Sexual activity: Not on file   Other Topics Concern  . Not on file   Social History Narrative   Lives by herself,  still drives   Has a twin sister in [place], a nephew in [place].                       Family History  Problem Relation Age of Onset  . Breast cancer Sister   . Hypertension Sister   . Heart failure Mother   . Heart disease Father   . Coronary artery disease Other     F, brothers x 2other family members  . Prostate cancer Brother   . Colon cancer Neg Hx   . Diabetes Neg Hx        Medication List       Accurate as of 02/09/16  6:08 PM. Always use your most recent med list.          atorvastatin 20 MG tablet Commonly known as:  LIPITOR Take 1 tablet (20 mg total) by mouth daily.   CALCIUM 600/VITAMIN Peggy PO Take 1 capsule by mouth daily.   ELIQUIS 2.5 MG Tabs tablet Generic drug:  apixaban Take 1 tablet by mouth   twice a day   Fish Oil 1200 MG Caps Take one tablet by mouth daily   losartan 50 MG tablet Commonly known as:  COZAAR Take 1 tablet by mouth  every day   multivitamin per tablet Take 1 tablet by mouth daily.          Objective:   Physical Exam BP 122/72 (BP Location: Left Arm, Patient Position: Sitting, Cuff Size: Small)   Pulse 92   Temp 98.1 F (36.7 C) (Oral)   Resp 12   Ht 5\' 5"  (1.651 m)   Wt 124 lb (56.2 kg)   SpO2 97%   BMI 20.63 kg/m   General:   Well developed, well nourished . NAD.  Neck: No  thyromegaly  HEENT:  Normocephalic . Face symmetric, atraumatic Lungs:  CTA B Normal respiratory effort, no intercostal retractions, no accessory muscle use. Heart: RRR,  no murmur.  No pretibial edema bilaterally  Abdomen:  Not distended, soft, non-tender. No rebound or rigidity.   Skin: Exposed areas without rash. Not pale. Not jaundice Neurologic:  alert & oriented X3.  Speech normal, gait appropriate for age and unassisted Strength symmetric and appropriate for age.  Psych: Cognition and judgment appear intact.  Cooperative with normal attention span and concentration.  Behavior appropriate. No anxious or depressed appearing.    Assessment & Plan:  Assessment>  HTN Hyperlipidemia CV: --Paroxysmal A. Fib >> ELIQUIS --Pacemaker for Mobitz 2 --Syncope 2007, ECHO aortic sclerosis, saw cardiology Osteopenia DEA 08-2006 and 10-2008 -----T score at the spine normal. DEXA 02-2011 T score of the hips was -2.0, -2.1, mild osteopenia. Declined rx as of 01-2015 DJD  Skin cancer, SCC, left leg, sees dermatology FH breast ca -sister, brother CHF  Plan  HTN: Continue losartan, checking a BMP. Hyperlipidemia: Continue Lipitor, check an FLP, AST, ALT Atrial fibrillation: Anticoagulated, no evidence of bleeding. Check a CBC. Continue Eliquis. Osteopenia: Calcium, vitamin Peggy and exercise. Vitamin Peggy levels have been normal.  RTC one year

## 2016-02-29 ENCOUNTER — Encounter: Payer: Self-pay | Admitting: Internal Medicine

## 2016-02-29 ENCOUNTER — Ambulatory Visit (INDEPENDENT_AMBULATORY_CARE_PROVIDER_SITE_OTHER): Payer: Medicare Other | Admitting: Internal Medicine

## 2016-02-29 VITALS — BP 128/74 | HR 93 | Ht 65.0 in | Wt 126.2 lb

## 2016-02-29 DIAGNOSIS — I480 Paroxysmal atrial fibrillation: Secondary | ICD-10-CM

## 2016-02-29 DIAGNOSIS — Z950 Presence of cardiac pacemaker: Secondary | ICD-10-CM

## 2016-02-29 DIAGNOSIS — I441 Atrioventricular block, second degree: Secondary | ICD-10-CM

## 2016-02-29 LAB — CUP PACEART INCLINIC DEVICE CHECK
Battery Impedance: 100 Ohm
Brady Statistic AP VP Percent: 0 %
Brady Statistic AP VS Percent: 19 %
Brady Statistic AS VP Percent: 0 %
Brady Statistic AS VS Percent: 81 %
Date Time Interrogation Session: 20171127161619
Implantable Lead Implant Date: 20080318
Implantable Lead Location: 753859
Implantable Lead Model: 5076
Lead Channel Impedance Value: 560 Ohm
Lead Channel Pacing Threshold Amplitude: 0.5 V
Lead Channel Pacing Threshold Amplitude: 1 V
Lead Channel Pacing Threshold Pulse Width: 0.4 ms
Lead Channel Sensing Intrinsic Amplitude: 15.68 mV
Lead Channel Setting Pacing Amplitude: 2 V
Lead Channel Setting Pacing Amplitude: 2.5 V
Lead Channel Setting Pacing Pulse Width: 0.4 ms
Lead Channel Setting Sensing Sensitivity: 5.6 mV
MDC IDC LEAD IMPLANT DT: 20080318
MDC IDC LEAD LOCATION: 753860
MDC IDC MSMT BATTERY REMAINING LONGEVITY: 156 mo
MDC IDC MSMT BATTERY VOLTAGE: 2.8 V
MDC IDC MSMT LEADCHNL RA IMPEDANCE VALUE: 466 Ohm
MDC IDC MSMT LEADCHNL RA PACING THRESHOLD AMPLITUDE: 0.375 V
MDC IDC MSMT LEADCHNL RA PACING THRESHOLD PULSEWIDTH: 0.4 ms
MDC IDC MSMT LEADCHNL RA PACING THRESHOLD PULSEWIDTH: 0.4 ms
MDC IDC MSMT LEADCHNL RA SENSING INTR AMPL: 5.6 mV
MDC IDC MSMT LEADCHNL RV PACING THRESHOLD AMPLITUDE: 1 V
MDC IDC MSMT LEADCHNL RV PACING THRESHOLD PULSEWIDTH: 0.4 ms
MDC IDC PG IMPLANT DT: 20170825

## 2016-02-29 NOTE — Patient Instructions (Signed)
Medication Instructions:  Your physician recommends that you continue on your current medications as directed. Please refer to the Current Medication list given to you today.   Labwork: None ordered   Testing/Procedures: None ordered   Follow-Up: Remote monitoring is used to monitor your Pacemaker  from home. This monitoring reduces the number of office visits required to check your device to one time per year. It allows [place] to keep an eye on the functioning of your device to ensure it is working properly. You are scheduled for a device check from home on 05/30/16. You may send your transmission at any time that day. If you have a wireless device, the transmission will be sent automatically. After your physician reviews your transmission, you will receive a postcard with your next transmission date.   Your physician wants you to follow-up in: 12 months with Dr Vallery Ridge will receive a reminder letter in the mail two months in advance. If you don't receive a letter, please call our office to schedule the follow-up appointment.   Any Other Special Instructions Will Be Listed Below (If Applicable).     If you need a refill on your cardiac medications before your next appointment, please call your pharmacy.

## 2016-02-29 NOTE — Progress Notes (Signed)
PCP: Kathlene November, MD Primary Cardiologist:  Dr Martinique  Peggy Mendez is a 82 y.o. female who presents today for routine electrophysiology followup.  Since her recent generator change, the patient reports doing very well.  She is limited by knee pain but does get out of the house quite often.   She is unaware of any symptoms of afib.  Her blood pressure is controlled.  She has had no bleeding on eliquis.  Today, she denies symptoms of palpitations, chest pain, shortness of breath,  lower extremity edema, dizziness, presyncope, or syncope.  The patient is otherwise without complaint today.   Past Medical History:  Diagnosis Date  . AV block    a. s/p MDT dual chamber PPM   . Cataract    REMOVED  . History of colonic polyps   . Hyperlipidemia   . Hypertension   . Osteopenia   . Paroxysmal atrial fibrillation (HCC)   . Sick sinus syndrome ([place])   . Skin cancer 2010   Left leg, SCC, sees derm  . Syncope 11/2005   Past Surgical History:  Procedure Laterality Date  . APPENDECTOMY    . BREAST BIOPSY Bilateral    several, 11/2014 showed fibrocystic changes, no evidence of malignancy  . BREAST BIOPSY Right 11-2015  . cataracts Bilateral   . COLONOSCOPY W/ BIOPSIES    . EP IMPLANTABLE DEVICE N/A 11/27/2015   MDT Adapta L gen change by Dr Rayann Heman  . HEMORRHOID SURGERY    . PPM  2008   MDT dual chamber PPM implanted by Dr Verlon Setting for intermittent CHB  . TOTAL ABDOMINAL HYSTERECTOMY     oophorectomy  . US ECHOCARDIOGRAPHY  12/08/2005   EF 55-60%    Current Outpatient Prescriptions  Medication Sig Dispense Refill  . atorvastatin (LIPITOR) 20 MG tablet Take 1 tablet (20 mg total) by mouth daily. 90 tablet 0  . Calcium Carbonate-Vitamin Peggy (CALCIUM 600/VITAMIN Peggy PO) Take 1 capsule by mouth daily.    Marland Kitchen ELIQUIS 2.5 MG TABS tablet Take 1 tablet by mouth  twice a day 180 tablet 1  . losartan (COZAAR) 50 MG tablet Take 1 tablet by mouth  every day 90 tablet 3  . multivitamin (THERAGRAN) per tablet  Take 1 tablet by mouth daily.      . Omega-3 Fatty Acids (FISH OIL) 1200 MG CAPS Take one tablet by mouth daily     No current facility-administered medications for this visit.     Physical Exam: Vitals:   02/29/16 1416  BP: 128/74  Pulse: 93  Weight: 126 lb 3.2 oz (57.2 kg)  Height: 5\' 5"  (1.651 m)    GEN- The patient is well appearing, alert and oriented x 3 today.   Head- normocephalic, atraumatic Eyes-  Sclera clear, conjunctiva pink Ears- hearing intact Oropharynx- clear Lungs- Clear to ausculation bilaterally, normal work of breathing Heart- Regular rate and rhythm, no murmurs, rubs or gallops, PMI not laterally displaced GI- soft, NT, ND, + BS Extremities- no clubbing, cyanosis, or edema  ekg today reveals atrial pacing at 93 bpm, PR 360 msec, LVH   Assessment and Plan:   1. AV block, 2nd degree/ sick sinus syndrome Normal pacemaker function  A paced 18%, V paced only 0.4% See Pace Art report  Rate response turned off today  2. PAROXYSMAL ATRIAL FIBRILLATION  Controlled (0.2%) Appropriately anticoagulated with eliquis 2.5mg  BID chads2vasc score is at least 4  carelink  Return to see me in 1 year Dr Martinique  to see as scheduled  Thompson Grayer MD, [facility] 02/29/2016 2:40 PM

## 2016-03-16 NOTE — Progress Notes (Signed)
[name] [address], [place] [address] Phone: [phone] Subjective:    I'm seeing this patient by the request  of:  Kathlene November, MD   CC: Knee pain  [id number]  Peggy Mendez is a 82 y.o. female coming in with complaint of bilateral knee pain. Past medical history significant for osteopenia.  Patient is having more left greater than right knee pain. Usually walks 3 miles a day. States that for seems to be worse with going upstairs. Mild instability. Sometimes severe pain on the left knee that wakes her up at night. Denies ever falling or giving out on her while walking. Rates the severity of pain a 5 out of 10. Patient has not been walking on a regular basis secondary to the discomfort.     Past Medical History:  Diagnosis Date  . AV block    a. s/p MDT dual chamber PPM   . Cataract    REMOVED  . History of colonic polyps   . Hyperlipidemia   . Hypertension   . Osteopenia   . Paroxysmal atrial fibrillation (HCC)   . Sick sinus syndrome ([place])   . Skin cancer 2010   Left leg, SCC, sees derm  . Syncope 11/2005   Past Surgical History:  Procedure Laterality Date  . APPENDECTOMY    . BREAST BIOPSY Bilateral    several, 11/2014 showed fibrocystic changes, no evidence of malignancy  . BREAST BIOPSY Right 11-2015  . cataracts Bilateral   . COLONOSCOPY W/ BIOPSIES    . EP IMPLANTABLE DEVICE N/A 11/27/2015   MDT Adapta L gen change by Dr Rayann Heman  . HEMORRHOID SURGERY    . PPM  2008   MDT dual chamber PPM implanted by Dr Verlon Setting for intermittent CHB  . TOTAL ABDOMINAL HYSTERECTOMY     oophorectomy  . US ECHOCARDIOGRAPHY  12/08/2005   EF 55-60%   Social History   Social History  . Marital status: Single    Spouse name: N/A  . Number of children: 0  . Years of education: N/A   Occupational History  . retired  Retired   Social History Main Topics  . Smoking status: Former Smoker    Types: Cigarettes    Quit date: 08/23/1988  .  Smokeless tobacco: Never Used     Comment: >20 yrs ago, used to smoke ~ 1 ppd  . Alcohol use Yes     Comment: socially  . Drug use: No  . Sexual activity: Not Asked   Other Topics Concern  . None   Social History Narrative   Lives by herself,  still drives   Has a twin sister in [place], a nephew in [place].                     Allergies  Allergen Reactions  . Anesthetics, Ester     Anesthetics not particularly Ester - nausea  . Lisinopril Cough  . Other Dermatitis and Rash    Tegaderm (clear occlusive dressing)   Family History  Problem Relation Age of Onset  . Breast cancer Sister   . Hypertension Sister   . Heart failure Mother   . Heart disease Father   . Coronary artery disease Other     F, brothers x 2other family members  . Prostate cancer Brother   . Colon cancer Neg Hx   . Diabetes Neg Hx     Past medical history, social, surgical and family  history all reviewed in electronic medical record.  No pertanent information unless stated regarding to the chief complaint.   Review of Systems:Review of systems updated and as accurate as of 03/17/16  No headache, visual changes, nausea, vomiting, diarrhea, constipation, dizziness, abdominal pain, skin rash, fevers, chills, night sweats, weight loss, swollen lymph nodes, body aches, joint swelling, muscle aches, chest pain, shortness of breath, mood changes.   Objective  Blood pressure 132/80, pulse 92, height 5\' 5"  (1.651 m), weight 126 lb (57.2 kg), SpO2 97 %. Systems examined below as of 03/17/16   General: No apparent distress alert and oriented x3 mood and affect normal, dressed appropriately.  HEENT: Pupils equal, extraocular movements intact  Respiratory: Patient's speak in full sentences and does not appear short of breath  Cardiovascular: No lower extremity edema, non tender, no erythema  Skin: Warm dry intact with no signs of infection or rash on extremities or on axial skeleton.  Abdomen: Soft nontender   Neuro: Cranial nerves II through XII are intact, neurovascularly intact in all extremities with 2+ DTRs and 2+ pulses.  Lymph: No lymphadenopathy of posterior or anterior cervical chain or axillae bilaterally.  Gait Mild antalgic gait MSK:  Non tender with full range of motion and good stability and symmetric strength and tone of shoulders, elbows, wrist, hip, and ankles bilaterally. 30 changes of multiple joints Knee: Bilateral Valgus deformity of the knees bilaterally with patient having chronic subluxation of the left knee Moderate tenderness to palpation over the medial and patellofemoral joints bilaterally left greater than right ROM full in flexion and extension and lower leg rotation. Instability noted with valgus force. Severely painful patellar compression left side Patellar glide with moderate to severe crepitus. Patellar and quadriceps tendons unremarkable. Hamstring and quadriceps strength is normal.    procedure note 97110; 15 minutes spent for Therapeutic exercises as stated in above notes.  This included exercises focusing on stretching, strengthening, with significant focus on eccentric aspects. Flexion and extension exercises with hip abduction strengthening and vastus medialis oblique strength  Proper technique shown and discussed handout in great detail with ATC.  All questions were discussed and answered.   Impression and Recommendations:     This case required medical decision making of moderate complexity.      Note: This dictation was prepared with Dragon dictation along with smaller phrase technology. Any transcriptional errors that result from this process are unintentional.

## 2016-03-17 ENCOUNTER — Ambulatory Visit (INDEPENDENT_AMBULATORY_CARE_PROVIDER_SITE_OTHER): Payer: Medicare Other | Admitting: Family Medicine

## 2016-03-17 ENCOUNTER — Encounter: Payer: Self-pay | Admitting: Family Medicine

## 2016-03-17 DIAGNOSIS — M170 Bilateral primary osteoarthritis of knee: Secondary | ICD-10-CM | POA: Diagnosis not present

## 2016-03-17 NOTE — Patient Instructions (Signed)
Good to see you  Peggy Mendez is your friend. Ice 20 minutes 2 times daily. Usually after activity and before bed. pennsaid pinkie amount topically 2 times daily as needed.  Vitamin D 2000 IU daily  Tart cherry extract any dose Wear brace with a lot of walking or if doing stairs.  Consider a recumbent bike and do this sometime.  See me again in 4 weeks.  Happy holidays!

## 2016-03-17 NOTE — Assessment & Plan Note (Signed)
Bilateral degenerative knee. We discussed icing regimen and home exercise. We discussed which activities doing which was to avoid. Work with Product/process development scientist to learn home exercises in greater detail. Patient will continue to be active though. We discussed cross training. Patient will follow-up in 4 weeks. Worsening symptoms consider formal physical therapy

## 2016-04-07 ENCOUNTER — Ambulatory Visit: Payer: Medicare Other | Admitting: Family Medicine

## 2016-04-19 NOTE — Progress Notes (Deleted)
[facility] [address], [place] [address] Phone: [phone] Subjective:    I'm seeing this patient by the request  of:  Kathlene November, MD   CC: Knee pain  [id number]  Peggy Mendez is a 83 y.o. female coming in with complaint of bilateral knee pain. Past medical history significant for osteopenia.  Patient is having more left greater than right knee pain. Usually walks 3 miles a day. States that for seems to be worse with going upstairs. Mild instability. Sometimes severe pain on the left knee that wakes her up at night. Denies ever falling or giving out on her while walking. Rates the severity of pain a 5 out of 10. Patient has not been walking on a regular basis secondary to the discomfort.     Past Medical History:  Diagnosis Date  . AV block    a. s/p MDT dual chamber PPM   . Cataract    REMOVED  . History of colonic polyps   . Hyperlipidemia   . Hypertension   . Osteopenia   . Paroxysmal atrial fibrillation (HCC)   . Sick sinus syndrome ([place])   . Skin cancer 2010   Left leg, SCC, sees derm  . Syncope 11/2005   Past Surgical History:  Procedure Laterality Date  . APPENDECTOMY    . BREAST BIOPSY Bilateral    several, 11/2014 showed fibrocystic changes, no evidence of malignancy  . BREAST BIOPSY Right 11-2015  . cataracts Bilateral   . COLONOSCOPY W/ BIOPSIES    . EP IMPLANTABLE DEVICE N/A 11/27/2015   MDT Adapta L gen change by Dr Rayann Heman  . HEMORRHOID SURGERY    . PPM  2008   MDT dual chamber PPM implanted by Dr Verlon Setting for intermittent CHB  . TOTAL ABDOMINAL HYSTERECTOMY     oophorectomy  . US ECHOCARDIOGRAPHY  12/08/2005   EF 55-60%   Social History   Social History  . Marital status: Single    Spouse name: N/A  . Number of children: 0  . Years of education: N/A   Occupational History  . retired  Retired   Social History Main Topics  . Smoking status: Former Smoker    Types: Cigarettes    Quit date: 08/23/1988  .  Smokeless tobacco: Never Used     Comment: >20 yrs ago, used to smoke ~ 1 ppd  . Alcohol use Yes     Comment: socially  . Drug use: No  . Sexual activity: Not on file   Other Topics Concern  . Not on file   Social History Narrative   Lives by herself,  still drives   Has a twin sister in [place], a nephew in [place].                     Allergies  Allergen Reactions  . Anesthetics, Ester     Anesthetics not particularly Ester - nausea  . Lisinopril Cough  . Other Dermatitis and Rash    Tegaderm (clear occlusive dressing)   Family History  Problem Relation Age of Onset  . Breast cancer Sister   . Hypertension Sister   . Heart failure Mother   . Heart disease Father   . Coronary artery disease Other     F, brothers x 2other family members  . Prostate cancer Brother   . Colon cancer Neg Hx   . Diabetes Neg Hx     Past medical history, social,  surgical and family history all reviewed in electronic medical record.  No pertanent information unless stated regarding to the chief complaint.   Review of Systems:Review of systems updated and as accurate as of 04/19/16  No headache, visual changes, nausea, vomiting, diarrhea, constipation, dizziness, abdominal pain, skin rash, fevers, chills, night sweats, weight loss, swollen lymph nodes, body aches, joint swelling, muscle aches, chest pain, shortness of breath, mood changes.   Objective  There were no vitals taken for this visit. Systems examined below as of 04/19/16   General: No apparent distress alert and oriented x3 mood and affect normal, dressed appropriately.  HEENT: Pupils equal, extraocular movements intact  Respiratory: Patient's speak in full sentences and does not appear short of breath  Cardiovascular: No lower extremity edema, non tender, no erythema  Skin: Warm dry intact with no signs of infection or rash on extremities or on axial skeleton.  Abdomen: Soft nontender  Neuro: Cranial nerves II through XII  are intact, neurovascularly intact in all extremities with 2+ DTRs and 2+ pulses.  Lymph: No lymphadenopathy of posterior or anterior cervical chain or axillae bilaterally.  Gait Mild antalgic gait MSK:  Non tender with full range of motion and good stability and symmetric strength and tone of shoulders, elbows, wrist, hip, and ankles bilaterally. 30 changes of multiple joints Knee: Bilateral Valgus deformity of the knees bilaterally with patient having chronic subluxation of the left knee Moderate tenderness to palpation over the medial and patellofemoral joints bilaterally left greater than right ROM full in flexion and extension and lower leg rotation. Instability noted with valgus force. Severely painful patellar compression left side Patellar glide with moderate to severe crepitus. Patellar and quadriceps tendons unremarkable. Hamstring and quadriceps strength is normal.    procedure note 97110; 15 minutes spent for Therapeutic exercises as stated in above notes.  This included exercises focusing on stretching, strengthening, with significant focus on eccentric aspects. Flexion and extension exercises with hip abduction strengthening and vastus medialis oblique strength  Proper technique shown and discussed handout in great detail with ATC.  All questions were discussed and answered.   Impression and Recommendations:     This case required medical decision making of moderate complexity.      Note: This dictation was prepared with Dragon dictation along with smaller phrase technology. Any transcriptional errors that result from this process are unintentional.

## 2016-04-20 ENCOUNTER — Ambulatory Visit: Payer: Medicare Other | Admitting: Family Medicine

## 2016-04-25 ENCOUNTER — Other Ambulatory Visit: Payer: Self-pay | Admitting: Internal Medicine

## 2016-04-25 DIAGNOSIS — Z1231 Encounter for screening mammogram for malignant neoplasm of breast: Secondary | ICD-10-CM

## 2016-04-28 ENCOUNTER — Encounter: Payer: Self-pay | Admitting: Family Medicine

## 2016-04-28 ENCOUNTER — Ambulatory Visit (INDEPENDENT_AMBULATORY_CARE_PROVIDER_SITE_OTHER): Payer: Medicare Other | Admitting: Family Medicine

## 2016-04-28 DIAGNOSIS — M170 Bilateral primary osteoarthritis of knee: Secondary | ICD-10-CM | POA: Diagnosis not present

## 2016-04-28 NOTE — Progress Notes (Signed)
[facility] [place],  [address] Phone: [phone] Subjective:    I'm seeing this patient by the request  of:  Kathlene November, MD   CC: Knee pain f/u  [id number]  Peggy Mendez is a 83 y.o. female coming in with complaint of bilateral knee pain. Past medical history significant for osteopenia.  Patient was seen previously and was diagnosed more of a degenerative joint disease. Patient was to try conservative therapy including home exercise, icing protocol, which activities to increase in which ones to avoid. Patient states  Overall doing well, much better states that she has been able to start increasing her activity and is even walking a little bit more.     Past Medical History:  Diagnosis Date  . AV block    a. s/p MDT dual chamber PPM   . Cataract    REMOVED  . History of colonic polyps   . Hyperlipidemia   . Hypertension   . Osteopenia   . Paroxysmal atrial fibrillation (HCC)   . Sick sinus syndrome ([place])   . Skin cancer 2010   Left leg, SCC, sees derm  . Syncope 11/2005   Past Surgical History:  Procedure Laterality Date  . APPENDECTOMY    . BREAST BIOPSY Bilateral    several, 11/2014 showed fibrocystic changes, no evidence of malignancy  . BREAST BIOPSY Right 11-2015  . cataracts Bilateral   . COLONOSCOPY W/ BIOPSIES    . EP IMPLANTABLE DEVICE N/A 11/27/2015   MDT Adapta L gen change by Dr Rayann Heman  . HEMORRHOID SURGERY    . PPM  2008   MDT dual chamber PPM implanted by Dr Verlon Setting for intermittent CHB  . TOTAL ABDOMINAL HYSTERECTOMY     oophorectomy  . US ECHOCARDIOGRAPHY  12/08/2005   EF 55-60%   Social History   Social History  . Marital status: Single    Spouse name: N/A  . Number of children: 0  . Years of education: N/A   Occupational History  . retired  Retired   Social History Main Topics  . Smoking status: Former Smoker    Types: Cigarettes    Quit date: 08/23/1988  . Smokeless tobacco: Never Used     Comment: >20 yrs ago, used to smoke ~ 1 ppd  . Alcohol use Yes     Comment: socially  . Drug use: No  . Sexual activity: Not Asked   Other Topics Concern  . None   Social History Narrative   Lives by herself,  still drives   Has a twin sister in [place], a nephew in [place].                     Allergies  Allergen Reactions  . Anesthetics, Ester     Anesthetics not particularly Ester - nausea  . Lisinopril Cough  . Other Dermatitis and Rash    Tegaderm (clear occlusive dressing)   Family History  Problem Relation Age of Onset  . Breast cancer Sister   . Hypertension Sister   . Heart failure Mother   . Heart disease Father   . Coronary artery disease Other     F, brothers x 2other family members  . Prostate cancer Brother   . Colon cancer Neg Hx   . Diabetes Neg Hx     Past medical history, social, surgical and family history all reviewed in electronic medical record.  No pertanent information unless stated regarding  to the chief complaint.   Review of Systems: No headache, visual changes, nausea, vomiting, diarrhea, constipation, dizziness, abdominal pain, skin rash, fevers, chills, night sweats, weight loss, swollen lymph nodes, body aches, joint swelling, muscle aches, chest pain, shortness of breath, mood changes.     Objective  Blood pressure 122/70, pulse 81, height 5\' 5"  (1.651 m), weight 128 lb (58.1 kg), SpO2 98 %.   Systems examined below as of 04/28/16 General: NAD A&O x3 mood, affect normal  HEENT: Pupils equal, extraocular movements intact no nystagmus Respiratory: not short of breath at rest or with speaking Cardiovascular: No lower extremity edema, non tender Skin: Warm dry intact with no signs of infection or rash on extremities or on axial skeleton. Abdomen: Soft nontender, no masses Neuro: Cranial nerves  intact, neurovascularly intact in all extremities with 2+ DTRs and 2+ pulses. Lymph: No lymphadenopathy appreciated today  Gait normal  with good balance and coordination.   MSK: tender with full range of motion and good stability and symmetric strength and tone of shoulders, elbows, wrist,  hips and ankles bilaterally. Arthritic changes of multiple joints  Knee: Bilateral Valgus deformity of the knees bilaterally with patient having chronic subluxation of the left knee Minimal tenderness on exam. ROM full in flexion and extension and lower leg rotation. Instability noted with valgus force. Patellar glide with moderate to severe crepitus. Patellar and quadriceps tendons unremarkable. Hamstring and quadriceps strength is normal.  Improvement from previous exam.    Impression and Recommendations:     This case required medical decision making of moderate complexity.      Note: This dictation was prepared with Dragon dictation along with smaller phrase technology. Any transcriptional errors that result from this process are unintentional.

## 2016-04-28 NOTE — Patient Instructions (Signed)
Great to see you  I am so impressed  Keep up with the exercises 2-3 times a week.  Do only the exercises that feel good and do  ot do any that cause pain  See me when you need me!

## 2016-04-28 NOTE — Assessment & Plan Note (Signed)
Doing much better with conservative therapy. Encourage patient to continue to be active and work on core strength. We discussed icing regimen. Patient come back and see me again as needed. Worsening symptoms we'll consider injection

## 2016-05-01 NOTE — Progress Notes (Signed)
D J Placido Date of Birth: [birth year] Medical Record [id number]  History of Present Illness: Peggy Mendez is seen for followup of Pacemaker and atrial fibrillation. She has a history of symptomatic AV block and is status post pacemaker implant. On pacemaker followup she has been found to have paroxysmal atrial fibrillation. These episodes have been asymptomatic.  She denies any chest pain. She has no shortness of breath. No dizziness or syncope. She is on anticoagulation with Eliquis. Dose reduced for age and renal function. Her last pacemaker evaluation showed some Afib but burden of 4.5 hours total. No sustained ventricular high rates. She had generator change out of her pacemaker in August 2017.  She states she is doing great today. She has seen Dr. Tamala Julian with Sports medicine and really notes improvement in knee pain. Is planning to walk more. No dizziness, palpitations.   Current Outpatient Prescriptions on File Prior to Visit  Medication Sig Dispense Refill  . atorvastatin (LIPITOR) 20 MG tablet Take 1 tablet (20 mg total) by mouth daily. 90 tablet 0  . Calcium Carbonate-Vitamin D (CALCIUM 600/VITAMIN D PO) Take 1 capsule by mouth daily.    . cholecalciferol (VITAMIN D) 1000 units tablet Take 2,000 Units by mouth daily.    Marland Kitchen ELIQUIS 2.5 MG TABS tablet Take 1 tablet by mouth  twice a day 180 tablet 1  . losartan (COZAAR) 50 MG tablet Take 1 tablet by mouth  every day 90 tablet 3  . Misc Natural Products (TART CHERRY ADVANCED) CAPS Take 1 capsule by mouth daily.    . multivitamin (THERAGRAN) per tablet Take 1 tablet by mouth daily.      . Omega-3 Fatty Acids (FISH OIL) 1200 MG CAPS Take one tablet by mouth daily     No current facility-administered medications on file prior to visit.     Allergies  Allergen Reactions  . Anesthetics, Ester     Anesthetics not particularly Ester - nausea  . Lisinopril Cough  . Other Dermatitis and Rash    Tegaderm (clear occlusive dressing)     Past Medical History:  Diagnosis Date  . AV block    a. s/p MDT dual chamber PPM   . Cataract    REMOVED  . History of colonic polyps   . Hyperlipidemia   . Hypertension   . Osteopenia   . Paroxysmal atrial fibrillation (HCC)   . Sick sinus syndrome ([place])   . Skin cancer 2010   Left leg, SCC, sees derm  . Syncope 11/2005    Past Surgical History:  Procedure Laterality Date  . APPENDECTOMY    . BREAST BIOPSY Bilateral    several, 11/2014 showed fibrocystic changes, no evidence of malignancy  . BREAST BIOPSY Right 11-2015  . cataracts Bilateral   . COLONOSCOPY W/ BIOPSIES    . EP IMPLANTABLE DEVICE N/A 11/27/2015   MDT Adapta L gen change by Dr Rayann Heman  . HEMORRHOID SURGERY    . PPM  2008   MDT dual chamber PPM implanted by Dr Verlon Setting for intermittent CHB  . TOTAL ABDOMINAL HYSTERECTOMY     oophorectomy  . US ECHOCARDIOGRAPHY  12/08/2005   EF 55-60%    History  Smoking Status  . Former Smoker  . Types: Cigarettes  . Quit date: 08/23/1988  Smokeless Tobacco  . Never Used    Comment: >20 yrs ago, used to smoke ~ 1 ppd    History  Alcohol Use  . Yes    Comment: socially  Family History  Problem Relation Age of Onset  . Breast cancer Sister   . Hypertension Sister   . Heart failure Mother   . Heart disease Father   . Coronary artery disease Other     F, brothers x 2other family members  . Prostate cancer Brother   . Colon cancer Neg Hx   . Diabetes Neg Hx     Review of Systems: As noted in history of present illness.  All other systems were reviewed and are negative.  Physical Exam: BP 126/78   Pulse 82   Ht 5\' 5"  (1.651 m)   Wt 125 lb 3.2 oz (56.8 kg)   BMI 20.83 kg/m  She is very pleasant and in no distress. The HEENT exam is normal. The carotids are 2+ without bruits.  There is no thyromegaly.  There is no JVD.  The lungs are clear.  Her pacemaker site is normal. The heart exam reveals a regular rate with a normal S1 and S2.  There are no  murmurs, gallops, or rubs.  The PMI is not displaced.       Exam of the legs reveal no clubbing, cyanosis, or edema.    The distal pulses are intact.  Cranial nerves II - XII are intact.  Motor and sensory functions are intact.  The gait is normal.  LABORATORY DATA: Lab Results  Component Value Date   WBC 7.0 02/09/2016   HGB 13.6 02/09/2016   HCT 40.6 02/09/2016   PLT 246.0 02/09/2016   GLUCOSE 96 02/09/2016   CHOL 199 02/09/2016   TRIG 61.0 02/09/2016   HDL 94.50 02/09/2016   LDLDIRECT 105.8 09/07/2012   LDLCALC 92 02/09/2016   ALT 22 02/09/2016   AST 26 02/09/2016   NA 138 02/09/2016   K 4.5 02/09/2016   CL 102 02/09/2016   CREATININE 0.76 02/09/2016   BUN 17 02/09/2016   CO2 31 02/09/2016   TSH 2.12 09/07/2012   INR 1.0 11/23/2015     Assessment / Plan: 1. Paroxysmal atrial fibrillation. Patient is asymptomatic.  Continue on Apixaban 2.5 mg twice a day.  2. Mobitz type 2 second degree AV block status post pacemaker implant. Keep follow up in pacemaker clinic.  3. Hypertension, controlled.   4. Hyperlipidemia. Adequate control on lipitor.  Follow up in 6 months.

## 2016-05-02 ENCOUNTER — Ambulatory Visit (INDEPENDENT_AMBULATORY_CARE_PROVIDER_SITE_OTHER): Payer: Medicare Other | Admitting: Cardiology

## 2016-05-02 ENCOUNTER — Encounter: Payer: Self-pay | Admitting: Cardiology

## 2016-05-02 VITALS — BP 126/78 | HR 82 | Ht 65.0 in | Wt 125.2 lb

## 2016-05-02 DIAGNOSIS — I480 Paroxysmal atrial fibrillation: Secondary | ICD-10-CM | POA: Diagnosis not present

## 2016-05-02 DIAGNOSIS — I441 Atrioventricular block, second degree: Secondary | ICD-10-CM | POA: Diagnosis not present

## 2016-05-02 NOTE — Patient Instructions (Addendum)
Continue your current therapy  I will see you in 6 months.   

## 2016-05-05 ENCOUNTER — Other Ambulatory Visit: Payer: Self-pay | Admitting: Internal Medicine

## 2016-05-30 ENCOUNTER — Ambulatory Visit (INDEPENDENT_AMBULATORY_CARE_PROVIDER_SITE_OTHER): Payer: Medicare Other | Admitting: *Deleted

## 2016-05-30 DIAGNOSIS — I441 Atrioventricular block, second degree: Secondary | ICD-10-CM

## 2016-05-30 NOTE — Progress Notes (Signed)
Remote pacemaker transmission.   

## 2016-05-31 ENCOUNTER — Encounter: Payer: Self-pay | Admitting: Cardiology

## 2016-06-01 ENCOUNTER — Ambulatory Visit
Admission: RE | Admit: 2016-06-01 | Discharge: 2016-06-01 | Disposition: A | Discharge status: Home / Self Care | Payer: Medicare Other | Source: Ambulatory Visit | Attending: Internal Medicine | Admitting: Internal Medicine

## 2016-06-01 DIAGNOSIS — Z1231 Encounter for screening mammogram for malignant neoplasm of breast: Secondary | ICD-10-CM

## 2016-06-01 LAB — CUP PACEART REMOTE DEVICE CHECK
Date Time Interrogation Session: 20180228090812
Implantable Lead Implant Date: 20080318
Implantable Lead Location: 753859
Implantable Lead Location: 753860
Implantable Lead Model: 5076
Implantable Pulse Generator Implant Date: 20170825
Lead Channel Setting Pacing Amplitude: 2.5 V
Lead Channel Setting Sensing Sensitivity: 5.6 mV
MDC IDC LEAD IMPLANT DT: 20080318
MDC IDC SET LEADCHNL RA PACING AMPLITUDE: 2 V
MDC IDC SET LEADCHNL RV PACING PULSEWIDTH: 0.4 ms

## 2016-07-20 ENCOUNTER — Other Ambulatory Visit: Payer: Self-pay

## 2016-07-20 DIAGNOSIS — I480 Paroxysmal atrial fibrillation: Secondary | ICD-10-CM

## 2016-07-20 NOTE — Telephone Encounter (Signed)
Needs CBC and BMP

## 2016-07-21 ENCOUNTER — Telehealth: Payer: Self-pay | Admitting: Cardiology

## 2016-07-21 MED ORDER — APIXABAN 2.5 MG PO TABS: 2.5000 mg | ORAL_TABLET | Freq: Two times a day (BID) | ORAL | 1 refills | Status: DC

## 2016-07-21 NOTE — Telephone Encounter (Signed)
New message       *STAT* If patient is at the pharmacy, call can be transferred to refill team.   1. Which medications need to be refilled? (please list name of each medication and dose if known)  eliquis 2. Which pharmacy/location (including street and city if local pharmacy) is medication to be sent to? [facility] 3. Do they need a 30 day or 90 day supply? 90 day supply

## 2016-07-21 NOTE — Telephone Encounter (Signed)
Refill sent.

## 2016-08-30 ENCOUNTER — Ambulatory Visit (INDEPENDENT_AMBULATORY_CARE_PROVIDER_SITE_OTHER): Payer: Medicare Other | Admitting: *Deleted

## 2016-08-30 DIAGNOSIS — I441 Atrioventricular block, second degree: Secondary | ICD-10-CM

## 2016-08-30 NOTE — Progress Notes (Signed)
Remote pacemaker transmission.   

## 2016-08-31 LAB — CUP PACEART REMOTE DEVICE CHECK
Battery Impedance: 100 Ohm
Brady Statistic AP VS Percent: 2 %
Brady Statistic AS VS Percent: 98 %
Date Time Interrogation Session: 20180529115710
Implantable Lead Implant Date: 20080318
Implantable Lead Location: 753860
Implantable Lead Model: 5076
Lead Channel Impedance Value: 617 Ohm
Lead Channel Pacing Threshold Pulse Width: 0.4 ms
Lead Channel Setting Pacing Amplitude: 2.5 V
MDC IDC LEAD IMPLANT DT: 20080318
MDC IDC LEAD LOCATION: 753859
MDC IDC MSMT BATTERY REMAINING LONGEVITY: 162 mo
MDC IDC MSMT BATTERY VOLTAGE: 2.79 V
MDC IDC MSMT LEADCHNL RA IMPEDANCE VALUE: 486 Ohm
MDC IDC MSMT LEADCHNL RA PACING THRESHOLD AMPLITUDE: 0.5 V
MDC IDC MSMT LEADCHNL RV PACING THRESHOLD AMPLITUDE: 0.75 V
MDC IDC MSMT LEADCHNL RV PACING THRESHOLD PULSEWIDTH: 0.4 ms
MDC IDC PG IMPLANT DT: 20170825
MDC IDC SET LEADCHNL RA PACING AMPLITUDE: 2 V
MDC IDC SET LEADCHNL RV PACING PULSEWIDTH: 0.4 ms
MDC IDC SET LEADCHNL RV SENSING SENSITIVITY: 5.6 mV
MDC IDC STAT BRADY AP VP PERCENT: 0 %
MDC IDC STAT BRADY AS VP PERCENT: 0 %

## 2016-09-06 ENCOUNTER — Encounter: Payer: Self-pay | Admitting: Cardiology

## 2016-10-19 NOTE — Progress Notes (Signed)
D J Kober Date of Birth: 1926-09-04 Medical Record #[id number]  History of Present Illness: Peggy Mendez is seen for followup of Pacemaker and atrial fibrillation. She has a history of symptomatic AV block and is status post pacemaker implant. On pacemaker followup she has been found to have paroxysmal atrial fibrillation. These episodes have been asymptomatic.  She denies any chest pain. She has no shortness of breath. No dizziness or syncope. She is on anticoagulation with Eliquis. Dose reduced for age and renal function. Her last pacemaker evaluation Aug 30, 2016 showed some Afib but burden of 0.5%.  No sustained ventricular high rates. She had generator change out of her pacemaker in August 2017.   She continues to have issues with knee pain. She is able to ride a bike but states she has to walk slow.   Current Outpatient Prescriptions on File Prior to Visit  Medication Sig Dispense Refill  . apixaban (ELIQUIS) 2.5 MG TABS tablet Take 1 tablet (2.5 mg total) by mouth 2 (two) times daily. 180 tablet 1  . atorvastatin (LIPITOR) 20 MG tablet Take 1 tablet (20 mg total) by mouth daily. 90 tablet 2  . Calcium Carbonate-Vitamin D (CALCIUM 600/VITAMIN D PO) Take 1 capsule by mouth daily.    . cholecalciferol (VITAMIN D) 1000 units tablet Take 2,000 Units by mouth daily.    Marland Kitchen losartan (COZAAR) 50 MG tablet Take 1 tablet by mouth  every day 90 tablet 3  . Misc Natural Products (TART CHERRY ADVANCED) CAPS Take 1 capsule by mouth daily.    . multivitamin (THERAGRAN) per tablet Take 1 tablet by mouth daily.      . Omega-3 Fatty Acids (FISH OIL) 1200 MG CAPS Take one tablet by mouth daily     No current facility-administered medications on file prior to visit.     Allergies  Allergen Reactions  . Anesthetics, Ester     Anesthetics not particularly Ester - nausea  . Lisinopril Cough  . Other Dermatitis and Rash    Tegaderm (clear occlusive dressing)    Past Medical History:  Diagnosis  Date  . AV block    a. s/p MDT dual chamber PPM   . Cataract    REMOVED  . History of colonic polyps   . Hyperlipidemia   . Hypertension   . Osteopenia   . Paroxysmal atrial fibrillation (HCC)   . Sick sinus syndrome ([place])   . Skin cancer 2010   Left leg, SCC, sees derm  . Syncope 11/2005    Past Surgical History:  Procedure Laterality Date  . APPENDECTOMY    . BREAST BIOPSY Bilateral    several, 11/2014 showed fibrocystic changes, no evidence of malignancy  . BREAST BIOPSY Right 11-2015  . cataracts Bilateral   . COLONOSCOPY W/ BIOPSIES    . EP IMPLANTABLE DEVICE N/A 11/27/2015   MDT Adapta L gen change by Dr Rayann Heman  . HEMORRHOID SURGERY    . PPM  2008   MDT dual chamber PPM implanted by Dr Verlon Setting for intermittent CHB  . TOTAL ABDOMINAL HYSTERECTOMY     oophorectomy  . US ECHOCARDIOGRAPHY  12/08/2005   EF 55-60%    History  Smoking Status  . Former Smoker  . Types: Cigarettes  . Quit date: 08/23/1988  Smokeless Tobacco  . Never Used    Comment: >20 yrs ago, used to smoke ~ 1 ppd    History  Alcohol Use  . Yes    Comment: socially  Family History  Problem Relation Age of Onset  . Breast cancer Sister   . Hypertension Sister   . Heart failure Mother   . Heart disease Father   . Coronary artery disease Other        F, brothers x 2other family members  . Prostate cancer Brother   . Colon cancer Neg Hx   . Diabetes Neg Hx     Review of Systems: As noted in history of present illness.  All other systems were reviewed and are negative.  Physical Exam: BP 140/72   Pulse 77   Ht 5\' 5"  (1.651 m)   Wt 125 lb 12.8 oz (57.1 kg)   SpO2 99%   BMI 20.93 kg/m  She is very pleasant and in no distress. The HEENT exam is normal. The carotids are 2+ without bruits.  There is no thyromegaly.  There is no JVD.  The lungs are clear.  Her pacemaker site is normal. The heart exam reveals a regular rate with a normal S1 and S2.  There are no murmurs, gallops, or rubs.  The  PMI is not displaced.       Exam of the legs reveal no clubbing, cyanosis, or edema.    The distal pulses are intact.  Cranial nerves II - XII are intact.  Motor and sensory functions are intact.  The gait is normal.  LABORATORY DATA: Lab Results  Component Value Date   WBC 7.0 02/09/2016   HGB 13.6 02/09/2016   HCT 40.6 02/09/2016   PLT 246.0 02/09/2016   GLUCOSE 96 02/09/2016   CHOL 199 02/09/2016   TRIG 61.0 02/09/2016   HDL 94.50 02/09/2016   LDLDIRECT 105.8 09/07/2012   LDLCALC 92 02/09/2016   ALT 22 02/09/2016   AST 26 02/09/2016   NA 138 02/09/2016   K 4.5 02/09/2016   CL 102 02/09/2016   CREATININE 0.76 02/09/2016   BUN 17 02/09/2016   CO2 31 02/09/2016   TSH 2.12 09/07/2012   INR 1.0 11/23/2015     Assessment / Plan: 1. Paroxysmal atrial fibrillation. Patient is asymptomatic.  Continue on Apixaban 2.5 mg twice a day.  2. Mobitz type 2 second degree AV block status post pacemaker implant. Pacer checks are satisfactory.   3. Hypertension, controlled.   4. Hyperlipidemia. Adequate control on lipitor. She is concerned that lipitor may be affecting her muscles. I have recommended a drug holiday for 2 months- if she notes a significant improvement then we should consider alternative therapy.  Follow up in 6 months.

## 2016-10-24 ENCOUNTER — Ambulatory Visit (INDEPENDENT_AMBULATORY_CARE_PROVIDER_SITE_OTHER): Payer: Medicare Other | Admitting: Cardiology

## 2016-10-24 ENCOUNTER — Encounter: Payer: Self-pay | Admitting: Cardiology

## 2016-10-24 VITALS — BP 140/72 | HR 77 | Ht 65.0 in | Wt 125.8 lb

## 2016-10-24 DIAGNOSIS — I441 Atrioventricular block, second degree: Secondary | ICD-10-CM

## 2016-10-24 DIAGNOSIS — I480 Paroxysmal atrial fibrillation: Secondary | ICD-10-CM

## 2016-10-24 DIAGNOSIS — Z950 Presence of cardiac pacemaker: Secondary | ICD-10-CM

## 2016-10-24 NOTE — Patient Instructions (Addendum)
Continue your current therapy  I will see you in 6 months.   

## 2016-11-29 ENCOUNTER — Ambulatory Visit (INDEPENDENT_AMBULATORY_CARE_PROVIDER_SITE_OTHER): Payer: Medicare Other | Admitting: *Deleted

## 2016-11-29 DIAGNOSIS — I441 Atrioventricular block, second degree: Secondary | ICD-10-CM

## 2016-11-29 LAB — CUP PACEART REMOTE DEVICE CHECK
Battery Remaining Longevity: 161 mo
Brady Statistic AS VP Percent: 0 %
Implantable Lead Implant Date: 20080318
Implantable Lead Location: 753860
Implantable Pulse Generator Implant Date: 20170825
Lead Channel Pacing Threshold Amplitude: 0.875 V
Lead Channel Pacing Threshold Pulse Width: 0.4 ms
Lead Channel Setting Pacing Amplitude: 2 V
Lead Channel Setting Pacing Pulse Width: 0.4 ms
Lead Channel Setting Sensing Sensitivity: 5.6 mV
MDC IDC LEAD IMPLANT DT: 20080318
MDC IDC LEAD LOCATION: 753859
MDC IDC MSMT BATTERY IMPEDANCE: 100 Ohm
MDC IDC MSMT BATTERY VOLTAGE: 2.79 V
MDC IDC MSMT LEADCHNL RA IMPEDANCE VALUE: 479 Ohm
MDC IDC MSMT LEADCHNL RA PACING THRESHOLD AMPLITUDE: 0.5 V
MDC IDC MSMT LEADCHNL RA PACING THRESHOLD PULSEWIDTH: 0.4 ms
MDC IDC MSMT LEADCHNL RV IMPEDANCE VALUE: 508 Ohm
MDC IDC SESS DTM: 20180828124208
MDC IDC SET LEADCHNL RV PACING AMPLITUDE: 2.5 V
MDC IDC STAT BRADY AP VP PERCENT: 0 %
MDC IDC STAT BRADY AP VS PERCENT: 2 %
MDC IDC STAT BRADY AS VS PERCENT: 97 %

## 2016-11-29 NOTE — Progress Notes (Signed)
Remote pacemaker transmission.   

## 2016-12-09 ENCOUNTER — Encounter: Payer: Self-pay | Admitting: Cardiology

## 2016-12-12 ENCOUNTER — Other Ambulatory Visit: Payer: Self-pay | Admitting: Cardiology

## 2017-01-30 ENCOUNTER — Encounter: Payer: Self-pay | Admitting: *Deleted

## 2017-02-10 ENCOUNTER — Ambulatory Visit (INDEPENDENT_AMBULATORY_CARE_PROVIDER_SITE_OTHER): Payer: Medicare Other | Admitting: Internal Medicine

## 2017-02-10 ENCOUNTER — Encounter: Payer: Self-pay | Admitting: Internal Medicine

## 2017-02-10 VITALS — BP 124/68 | HR 68 | Temp 98.2°F | Resp 14 | Ht 65.0 in | Wt 126.2 lb

## 2017-02-10 DIAGNOSIS — Z0000 Encounter for general adult medical examination without abnormal findings: Secondary | ICD-10-CM | POA: Diagnosis not present

## 2017-02-10 DIAGNOSIS — M1990 Unspecified osteoarthritis, unspecified site: Secondary | ICD-10-CM

## 2017-02-10 DIAGNOSIS — Z23 Encounter for immunization: Secondary | ICD-10-CM | POA: Diagnosis not present

## 2017-02-10 DIAGNOSIS — E785 Hyperlipidemia, unspecified: Secondary | ICD-10-CM

## 2017-02-10 DIAGNOSIS — I10 Essential (primary) hypertension: Secondary | ICD-10-CM | POA: Diagnosis not present

## 2017-02-10 DIAGNOSIS — M810 Age-related osteoporosis without current pathological fracture: Secondary | ICD-10-CM | POA: Diagnosis not present

## 2017-02-10 LAB — CBC WITH DIFFERENTIAL/PLATELET
Basophils Absolute: 0.1 10*3/uL (ref 0.0–0.1)
Basophils Relative: 1.1 % (ref 0.0–3.0)
Eosinophils Absolute: 0.1 10*3/uL (ref 0.0–0.7)
Eosinophils Relative: 1.7 % (ref 0.0–5.0)
HCT: 40.5 % (ref 36.0–46.0)
Hemoglobin: 13.3 g/dL (ref 12.0–15.0)
Lymphocytes Relative: 33.1 % (ref 12.0–46.0)
Lymphs Abs: 1.9 10*3/uL (ref 0.7–4.0)
MCHC: 32.7 g/dL (ref 30.0–36.0)
MCV: 88 fl (ref 78.0–100.0)
Monocytes Absolute: 0.7 10*3/uL (ref 0.1–1.0)
Monocytes Relative: 11.7 % (ref 3.0–12.0)
Neutro Abs: 3.1 10*3/uL (ref 1.4–7.7)
Neutrophils Relative %: 52.4 % (ref 43.0–77.0)
Platelets: 245 10*3/uL (ref 150.0–400.0)
RBC: 4.6 Mil/uL (ref 3.87–5.11)
RDW: 13.7 % (ref 11.5–15.5)
WBC: 5.9 10*3/uL (ref 4.0–10.5)

## 2017-02-10 LAB — COMPREHENSIVE METABOLIC PANEL
ALT: 19 U/L (ref 0–35)
AST: 25 U/L (ref 0–37)
Albumin: 4.1 g/dL (ref 3.5–5.2)
Alkaline Phosphatase: 66 U/L (ref 39–117)
BUN: 14 mg/dL (ref 6–23)
CO2: 29 mEq/L (ref 19–32)
Calcium: 9.9 mg/dL (ref 8.4–10.5)
Chloride: 101 mEq/L (ref 96–112)
Creatinine, Ser: 0.68 mg/dL (ref 0.40–1.20)
GFR: 86.7 mL/min (ref >60.00)
Glucose, Bld: 96 mg/dL (ref 70–99)
Potassium: 4.5 mEq/L (ref 3.5–5.1)
Sodium: 138 mEq/L (ref 135–145)
Total Bilirubin: 0.8 mg/dL (ref 0.2–1.2)
Total Protein: 7.1 g/dL (ref 6.0–8.3)

## 2017-02-10 LAB — LIPID PANEL
CHOL/HDL RATIO: 3
CHOLESTEROL: 222 mg/dL — AB (ref 0–200)
HDL: 88.3 mg/dL (ref >39.00)
LDL CALC: 123 mg/dL — AB (ref 0–99)
NONHDL: 133.61
Triglycerides: 52 mg/dL (ref 0.0–149.0)
VLDL: 10.4 mg/dL (ref 0.0–40.0)

## 2017-02-10 LAB — TSH: TSH: 2.59 u[IU]/mL (ref 0.35–4.50)

## 2017-02-10 NOTE — Progress Notes (Signed)
Pre visit review using our clinic review tool, if applicable. No additional management support is needed unless otherwise documented below in the visit note. 

## 2017-02-10 NOTE — Patient Instructions (Signed)
GO TO THE LAB : Get the blood work     GO TO THE FRONT DESK Schedule your next appointment for a  Physical exam in 1 year    Fall Prevention in the Home Falls can cause injuries and can affect people from all age groups. There are many simple things that you can do to make your home safe and to help prevent falls. What can I do on the outside of my home?  Regularly repair the edges of walkways and driveways and fix any cracks.  Remove high doorway thresholds.  Trim any shrubbery on the main path into your home.  Use bright outdoor lighting.  Clear walkways of debris and clutter, including tools and rocks.  Regularly check that handrails are securely fastened and in good repair. Both sides of any steps should have handrails.  Install guardrails along the edges of any raised decks or porches.  Have leaves, snow, and ice cleared regularly.  Use sand or salt on walkways during winter months.  In the garage, clean up any spills right away, including grease or oil spills. What can I do in the bathroom?  Use night lights.  Install grab bars by the toilet and in the tub and shower. Do not use towel bars as grab bars.  Use non-skid mats or decals on the floor of the tub or shower.  If you need to sit down while you are in the shower, use a plastic, non-slip stool.  Keep the floor dry. Immediately clean up any water that spills on the floor.  Remove soap buildup in the tub or shower on a regular basis.  Attach bath mats securely with double-sided non-slip rug tape.  Remove throw rugs and other tripping hazards from the floor. What can I do in the bedroom?  Use night lights.  Make sure that a bedside light is easy to reach.  Do not use oversized bedding that drapes onto the floor.  Have a firm chair that has side arms to use for getting dressed.  Remove throw rugs and other tripping hazards from the floor. What can I do in the kitchen?  Clean up any spills right  away.  Avoid walking on wet floors.  Place frequently used items in easy-to-reach places.  If you need to reach for something above you, use a sturdy step stool that has a grab bar.  Keep electrical cables out of the way.  Do not use floor polish or wax that makes floors slippery. If you have to use wax, make sure that it is non-skid floor wax.  Remove throw rugs and other tripping hazards from the floor. What can I do in the stairways?  Do not leave any items on the stairs.  Make sure that there are handrails on both sides of the stairs. Fix handrails that are broken or loose. Make sure that handrails are as long as the stairways.  Check any carpeting to make sure that it is firmly attached to the stairs. Fix any carpet that is loose or worn.  Avoid having throw rugs at the top or bottom of stairways, or secure the rugs with carpet tape to prevent them from moving.  Make sure that you have a light switch at the top of the stairs and the bottom of the stairs. If you do not have them, have them installed. What are some other fall prevention tips?  Wear closed-toe shoes that fit well and support your feet. Wear shoes that have rubber  soles or low heels.  When you use a stepladder, make sure that it is completely opened and that the sides are firmly locked. Have someone hold the ladder while you are using it. Do not climb a closed stepladder.  Add color or contrast paint or tape to grab bars and handrails in your home. Place contrasting color strips on the first and last steps.  Use mobility aids as needed, such as canes, walkers, scooters, and crutches.  Turn on lights if it is dark. Replace any light bulbs that burn out.  Set up furniture so that there are clear paths. Keep the furniture in the same spot.  Fix any uneven floor surfaces.  Choose a carpet design that does not hide the edge of steps of a stairway.  Be aware of any and all pets.  Review your medicines with your  healthcare provider. Some medicines can cause dizziness or changes in blood pressure, which increase your risk of falling. Talk with your health care provider about other ways that you can decrease your risk of falls. This may include working with a physical therapist or trainer to improve your strength, balance, and endurance. This information is not intended to replace advice given to you by your health care provider. Make sure you discuss any questions you have with your health care provider. Document Released: 03/11/2002 Document Revised: 08/18/2015 Document Reviewed: 04/25/2014 Elsevier Interactive Patient Education  2017 [employer].

## 2017-02-10 NOTE — Assessment & Plan Note (Signed)
HTN: Seems controlled on losartan, checking labs High cholesterol: On Lipitor, checking labs Cardiovascular: Anticoagulated for paroxysmal A. fib, sees cardiology regularly. Osteopenia: Has declined it treated before DJD: Unable to exercise much d/t  DJD, still doing a stationary bike. RTC 1 year

## 2017-02-10 NOTE — Assessment & Plan Note (Addendum)
-  Td  2016;  Had a shingles shot 2011;  Pneumonia shot 2005 and 02-2017;  Prevnar: 2015; shingrix dicussed Had a flu shot -no further screening for cervical ca -h/o abnormal MMG and breast Bx; breast bx neg 11-2014;  MMG 05-2016 -[facility]-- Cscope 8-09, 2 polyps,  Cscope 8-14, + polyps, no further scopes  -Abd u/s 2013 (-) for AAA  -diet healthy, exercise: does stationary bike

## 2017-02-10 NOTE — Progress Notes (Signed)
Subjective:    Patient ID: Peggy Mendez, female    DOB: 1927/03/04, 83 y.o.   MRN: [id number]  DOS:  02/10/2017 Type of visit - description : cpx Interval history: No major concerns, medications reviewed, good compliance, no apparent side effects.   Review of Systems  A 14 point review of systems is negative    Past Medical History:  Diagnosis Date  . AV block    a. s/p MDT dual chamber PPM   . Cataract    REMOVED  . History of colonic polyps   . Hyperlipidemia   . Hypertension   . Osteopenia   . Paroxysmal atrial fibrillation (HCC)   . Sick sinus syndrome ([place])   . Skin cancer 2010   Left leg, SCC, sees derm  . Syncope 11/2005    Past Surgical History:  Procedure Laterality Date  . APPENDECTOMY    . BREAST BIOPSY Bilateral    several, 11/2014 showed fibrocystic changes, no evidence of malignancy  . BREAST BIOPSY Right 11-2015  . cataracts Bilateral   . COLONOSCOPY W/ BIOPSIES    . HEMORRHOID SURGERY    . PPM  2008   MDT dual chamber PPM implanted by Dr Verlon Setting for intermittent CHB  . TOTAL ABDOMINAL HYSTERECTOMY     oophorectomy  . US ECHOCARDIOGRAPHY  12/08/2005   EF 55-60%    Social History   Socioeconomic History  . Marital status: Single    Spouse name: Not on file  . Number of children: 0  . Years of education: Not on file  . Highest education level: Not on file  Social Needs  . Financial resource strain: Not on file  . Food insecurity - worry: Not on file  . Food insecurity - inability: Not on file  . Transportation needs - medical: Not on file  . Transportation needs - non-medical: Not on file  Occupational History  . Occupation: retired     Fish farm manager: RETIRED  Tobacco Use  . Smoking status: Former Smoker    Types: Cigarettes    Last attempt to quit: 08/23/1988    Years since quitting: 28.4  . Smokeless tobacco: Never Used  . Tobacco comment: >20 yrs ago, used to smoke ~ 1 ppd  Substance and Sexual Activity  . Alcohol use: Yes    Comment:  socially  . Drug use: No  . Sexual activity: Not on file  Other Topics Concern  . Not on file  Social History Narrative   Lives by herself,  still drives   Has a twin sister in [place], a nephew in [place].     Family History  Problem Relation Age of Onset  . Breast cancer Sister   . Hypertension Sister   . Heart failure Mother   . Heart disease Father   . Coronary artery disease Other        F, brothers x 2other family members  . Prostate cancer Brother   . Colon cancer Neg Hx   . Diabetes Neg Hx      Allergies as of 02/10/2017      Reactions   Anesthetics, Ester    Anesthetics not particularly Ester - nausea   Lisinopril Cough   Other Dermatitis, Rash   Tegaderm (clear occlusive dressing)      Medication List        Accurate as of 02/10/17  4:43 PM. Always use your most recent med list.          apixaban 2.5  MG Tabs tablet Commonly known as:  ELIQUIS Take 1 tablet (2.5 mg total) by mouth 2 (two) times daily.   atorvastatin 20 MG tablet Commonly known as:  LIPITOR Take 1 tablet (20 mg total) by mouth daily.   CALCIUM 600/VITAMIN Peggy PO Take 1 capsule by mouth daily.   cholecalciferol 1000 units tablet Commonly known as:  VITAMIN Peggy Take 2,000 Units by mouth daily.   Diclofenac Sodium 1.5 % Soln Place 1 drop onto the skin as directed.   Fish Oil 1200 MG Caps Take one tablet by mouth daily   losartan 50 MG tablet Commonly known as:  COZAAR TAKE 1 TABLET BY MOUTH  EVERY DAY   multivitamin per tablet Take 1 tablet by mouth daily.   TART CHERRY ADVANCED Caps Take 1 capsule by mouth daily.          Objective:   Physical Exam BP 124/68 (BP Location: Left Arm, Patient Position: Sitting, Cuff Size: Small)   Pulse 68   Temp 98.2 F (36.8 C) (Oral)   Resp 14   Ht 5\' 5"  (1.651 m)   Wt 126 lb 4 oz (57.3 kg)   SpO2 96%   BMI 21.01 kg/m  General:   Well developed, well nourished . NAD.  HEENT:  Normocephalic . Face symmetric, atraumatic Lungs:    CTA B Normal respiratory effort, no intercostal retractions, no accessory muscle use. Heart: Seems regular with occasional skipped beat,  no murmur.  No pretibial edema bilaterally  Abdomen:  Not distended, soft, non-tender. No rebound or rigidity.   Skin: Exposed areas without rash. Not pale. Not jaundice Neurologic:  alert & oriented X3.  Speech normal, gait appropriate for age and unassisted Strength symmetric and appropriate for age.  Psych: Cognition and judgment appear intact.  Cooperative with normal attention span and concentration.  Behavior appropriate. No anxious or depressed appearing.     Assessment & Plan:   Assessment  HTN Hyperlipidemia CV: --Paroxysmal A. Fib >> ELIQUIS --Pacemaker for Mobitz 2 --Syncope 2007, ECHO aortic sclerosis, saw cardiology Osteopenia DEA 08-2006 and 10-2008 -----T score at the spine normal. DEXA 02-2011 T score of the hips was -2.0, -2.1, mild osteopenia. Declined rx as of 01-2015 DJD  Skin cancer, SCC, left leg, sees dermatology FH breast ca -sister, brother CHF  Plan  HTN: Seems controlled on losartan, checking labs High cholesterol: On Lipitor, checking labs Cardiovascular: Anticoagulated for paroxysmal A. fib, sees cardiology regularly. Osteopenia: Has declined it treated before DJD: Unable to exercise much Peggy/t  DJD, still doing a stationary bike. RTC 1 year

## 2017-02-13 ENCOUNTER — Encounter: Payer: Self-pay | Admitting: Internal Medicine

## 2017-02-13 ENCOUNTER — Ambulatory Visit: Payer: Medicare Other | Admitting: Internal Medicine

## 2017-02-13 VITALS — BP 148/80 | HR 69 | Ht 65.0 in | Wt 126.2 lb

## 2017-02-13 DIAGNOSIS — I495 Sick sinus syndrome: Secondary | ICD-10-CM

## 2017-02-13 DIAGNOSIS — I480 Paroxysmal atrial fibrillation: Secondary | ICD-10-CM | POA: Diagnosis not present

## 2017-02-13 DIAGNOSIS — I441 Atrioventricular block, second degree: Secondary | ICD-10-CM | POA: Diagnosis not present

## 2017-02-13 LAB — CUP PACEART INCLINIC DEVICE CHECK
Battery Remaining Longevity: 157 mo
Brady Statistic AS VS Percent: 97 %
Implantable Lead Location: 753860
Implantable Lead Model: 5076
Implantable Pulse Generator Implant Date: 20170825
Lead Channel Pacing Threshold Amplitude: 0.5 V
Lead Channel Pacing Threshold Amplitude: 0.875 V
Lead Channel Pacing Threshold Pulse Width: 0.4 ms
Lead Channel Pacing Threshold Pulse Width: 0.4 ms
Lead Channel Sensing Intrinsic Amplitude: 4 mV
Lead Channel Setting Pacing Pulse Width: 0.4 ms
MDC IDC LEAD IMPLANT DT: 20080318
MDC IDC LEAD IMPLANT DT: 20080318
MDC IDC LEAD LOCATION: 753859
MDC IDC MSMT BATTERY IMPEDANCE: 111 Ohm
MDC IDC MSMT BATTERY VOLTAGE: 2.79 V
MDC IDC MSMT LEADCHNL RA IMPEDANCE VALUE: 460 Ohm
MDC IDC MSMT LEADCHNL RA PACING THRESHOLD AMPLITUDE: 0.375 V
MDC IDC MSMT LEADCHNL RA PACING THRESHOLD PULSEWIDTH: 0.4 ms
MDC IDC MSMT LEADCHNL RV IMPEDANCE VALUE: 514 Ohm
MDC IDC MSMT LEADCHNL RV PACING THRESHOLD AMPLITUDE: 0.75 V
MDC IDC MSMT LEADCHNL RV PACING THRESHOLD PULSEWIDTH: 0.4 ms
MDC IDC MSMT LEADCHNL RV SENSING INTR AMPL: 15.67 mV
MDC IDC SESS DTM: 20181112171307
MDC IDC SET LEADCHNL RA PACING AMPLITUDE: 2 V
MDC IDC SET LEADCHNL RV PACING AMPLITUDE: 2.5 V
MDC IDC SET LEADCHNL RV SENSING SENSITIVITY: 5.6 mV
MDC IDC STAT BRADY AP VP PERCENT: 0 %
MDC IDC STAT BRADY AP VS PERCENT: 3 %
MDC IDC STAT BRADY AS VP PERCENT: 0 %

## 2017-02-13 NOTE — Progress Notes (Signed)
PCP: Colon Branch, MD Primary Cardiologist:  Dr Martinique Primary EP:  Dr Jabier Gauss Beougher is a 83 y.o. female who presents today for routine electrophysiology followup.  Since last being seen in our clinic, the patient reports doing very well.  Today, she denies symptoms of palpitations, chest pain, shortness of breath,  lower extremity edema, dizziness, presyncope, or syncope.  The patient is otherwise without complaint today.   Past Medical History:  Diagnosis Date  . Cataract    REMOVED  . History of colonic polyps   . Hyperlipidemia   . Hypertension   . Osteopenia   . Paroxysmal atrial fibrillation (HCC)   . Sick sinus syndrome ([place])   . Skin cancer 2010   Left leg, SCC, sees derm  . Syncope 11/2005   Past Surgical History:  Procedure Laterality Date  . APPENDECTOMY    . BREAST BIOPSY Bilateral    several, 11/2014 showed fibrocystic changes, no evidence of malignancy  . BREAST BIOPSY Right 11-2015  . cataracts Bilateral   . COLONOSCOPY W/ BIOPSIES    . HEMORRHOID SURGERY    . PPM  2008   MDT dual chamber PPM implanted by Dr Verlon Setting for sick sinus and intermittent CHB  . TOTAL ABDOMINAL HYSTERECTOMY     oophorectomy  . US ECHOCARDIOGRAPHY  12/08/2005   EF 55-60%    ROS- all systems are reviewed and negative except as per HPI above  Current Outpatient Medications  Medication Sig Dispense Refill  . apixaban (ELIQUIS) 2.5 MG TABS tablet Take 1 tablet (2.5 mg total) by mouth 2 (two) times daily. 180 tablet 1  . atorvastatin (LIPITOR) 20 MG tablet Take 1 tablet (20 mg total) by mouth daily. 90 tablet 2  . Calcium Carbonate-Vitamin D (CALCIUM 600/VITAMIN D PO) Take 1 capsule by mouth daily.    . cholecalciferol (VITAMIN D) 1000 units tablet Take 2,000 Units by mouth daily.    . Diclofenac Sodium 1.5 % SOLN Place 1 drop onto the skin as directed.    Marland Kitchen losartan (COZAAR) 50 MG tablet TAKE 1 TABLET BY MOUTH  EVERY DAY 90 tablet 0  . Misc Natural Products (TART CHERRY  ADVANCED) CAPS Take 1 capsule by mouth daily.    . multivitamin (THERAGRAN) per tablet Take 1 tablet by mouth daily.      . Omega-3 Fatty Acids (FISH OIL) 1200 MG CAPS Take one tablet by mouth daily     No current facility-administered medications for this visit.     Physical Exam: Vitals:   02/13/17 1529  BP: (!) 148/80  Pulse: 69  SpO2: 96%  Weight: 126 lb 3.2 oz (57.2 kg)  Height: 5\' 5"  (1.651 m)    GEN- The patient is elderly and pleasant appearing, alert and oriented x 3 today.   Head- normocephalic, atraumatic Eyes-  Sclera clear, conjunctiva pink Ears- hearing intact Oropharynx- clear Lungs- Clear to ausculation bilaterally, normal work of breathing Chest- pacemaker pocket is well healed Heart- Regular rate and rhythm, no murmurs, rubs or gallops, PMI not laterally displaced GI- soft, NT, ND, + BS Extremities- no clubbing, cyanosis, or edema  Pacemaker interrogation- reviewed in detail today,  See PACEART report  ekg tracing ordered today is personally reviewed and shows sinus rhythm with PACs, otherwise normal ekg  Assessment and Plan:  1. Symptomatic sinus bradycardia  Normal pacemaker function See Pace Art report No changes today  2. Paroxysmal atrial fibrillation afib burden is 0.4% (0.2% last visit) Continue  on eliquis 2.5mg  BID chads2vasc score is at least 4  carelink Follow-up with Dr Martinique as scheduled Return to see me in a year (she declines EP APP)  Thompson Grayer MD, [facility] 02/13/2017 3:49 PM

## 2017-02-13 NOTE — Patient Instructions (Addendum)
Medication Instructions:  Your physician recommends that you continue on your current medications as directed. Please refer to the Current Medication list given to you today.  -- If you need a refill on your cardiac medications before your next appointment, please call your pharmacy. --  Labwork: None ordered  Testing/Procedures: None ordered  Follow-Up: Your physician wants you to follow-up in: 1 year with Dr. Rayann Heman.  You will receive a reminder letter in the mail two months in advance. If you don't receive a letter, please call our office to schedule the follow-up appointment.  Remote monitoring is used to monitor your Pacemaker from home. This monitoring reduces the number of office visits required to check your device to one time per year. It allows [place] to keep an eye on the functioning of your device to ensure it is working properly. You are scheduled for a device check from home on 05/15/2017. You may send your transmission at any time that day. If you have a wireless device, the transmission will be sent automatically. After your physician reviews your transmission, you will receive a postcard with your next transmission date.    Thank you for choosing [facility]!!   Frederik Schmidt, RN [phone]  Any Other Special Instructions Will Be Listed Below (If Applicable).

## 2017-03-06 ENCOUNTER — Other Ambulatory Visit: Payer: Self-pay | Admitting: Cardiology

## 2017-03-06 ENCOUNTER — Other Ambulatory Visit: Payer: Self-pay | Admitting: Internal Medicine

## 2017-03-06 DIAGNOSIS — I480 Paroxysmal atrial fibrillation: Secondary | ICD-10-CM

## 2017-04-23 NOTE — Progress Notes (Signed)
D J Collantes Date of Birth: Sep 28, 1926 Medical Record #[id number]  History of Present Illness: [name] is seen for followup of Pacemaker and atrial fibrillation. She has a history of symptomatic AV block and is status post pacemaker implant. On pacemaker followup she has been found to have paroxysmal atrial fibrillation. These episodes have been asymptomatic.  She denies any chest pain. She has no shortness of breath. No dizziness or syncope. She is on anticoagulation with Eliquis. Dose reduced for age and renal function. Her last pacemaker evaluation November 2018 showed some Afib but burden of 0.4%.  No sustained ventricular high rates. She had generator change out of her pacemaker in August 2017.   She continues to have issues with knee pain. She was having increased problems with walking and was concerned about the effects on lipitor. She went off lipitor in November and has noted a significant improvement in her walking. She is interested in a non statin solution.  She also notes an intermittent cough. Nonproductive and usually in the morning.   Current Outpatient Medications on File Prior to Visit  Medication Sig Dispense Refill  . Calcium Carbonate-Vitamin D (CALCIUM 600/VITAMIN D PO) Take 1 capsule by mouth daily.    . cholecalciferol (VITAMIN D) 1000 units tablet Take 2,000 Units by mouth daily.    Marland Kitchen ELIQUIS 2.5 MG TABS tablet TAKE 1 TABLET BY MOUTH TWO  TIMES DAILY 180 tablet 1  . losartan (COZAAR) 50 MG tablet TAKE 1 TABLET BY MOUTH  EVERY DAY 90 tablet 0  . Misc Natural Products (TART CHERRY ADVANCED) CAPS Take 1 capsule by mouth daily.    . multivitamin (THERAGRAN) per tablet Take 1 tablet by mouth daily.      . Omega-3 Fatty Acids (FISH OIL) 1200 MG CAPS Take one tablet by mouth daily    . Diclofenac Sodium 1.5 % SOLN Place 1 drop onto the skin as directed.     No current facility-administered medications on file prior to visit.     Allergies  Allergen Reactions  .  Anesthetics, Ester     Anesthetics not particularly Ester - nausea  . Lisinopril Cough  . Other Dermatitis and Rash    Tegaderm (clear occlusive dressing) Pt was miserable    Past Medical History:  Diagnosis Date  . Cataract    REMOVED  . History of colonic polyps   . Hyperlipidemia   . Hypertension   . Osteopenia   . Paroxysmal atrial fibrillation (HCC)   . Sick sinus syndrome ([place])   . Skin cancer 2010   Left leg, SCC, sees derm  . Syncope 11/2005    Past Surgical History:  Procedure Laterality Date  . APPENDECTOMY    . BREAST BIOPSY Bilateral    several, 11/2014 showed fibrocystic changes, no evidence of malignancy  . BREAST BIOPSY Right 11-2015  . cataracts Bilateral   . COLONOSCOPY W/ BIOPSIES    . EP IMPLANTABLE DEVICE N/A 11/27/2015   MDT Adapta L gen change by Dr Rayann Heman  . HEMORRHOID SURGERY    . PPM  2008   MDT dual chamber PPM implanted by Dr Verlon Setting for sick sinus and intermittent CHB  . TOTAL ABDOMINAL HYSTERECTOMY     oophorectomy  . US ECHOCARDIOGRAPHY  12/08/2005   EF 55-60%    Social History   Tobacco Use  Smoking Status Former Smoker  . Types: Cigarettes  . Last attempt to quit: 08/23/1988  . Years since quitting: 28.6  Smokeless Tobacco Never Used  Tobacco Comment   >20 yrs ago, used to smoke ~ 1 ppd    Social History   Substance and Sexual Activity  Alcohol Use Yes   Comment: socially    Family History  Problem Relation Age of Onset  . Breast cancer Sister   . Hypertension Sister   . Heart failure Mother   . Heart disease Father   . Coronary artery disease Other        F, brothers x 2other family members  . Prostate cancer Brother   . Colon cancer Neg Hx   . Diabetes Neg Hx     Review of Systems: As noted in history of present illness.  All other systems were reviewed and are negative.  Physical Exam: BP 120/62   Pulse 64   Ht 5\' 5"  (1.651 m)   Wt 124 lb 6.4 oz (56.4 kg)   BMI 20.70 kg/m  GENERAL:  Well appearing, elderly  WF in NAD HEENT:  PERRL, EOMI, sclera are clear. Oropharynx is clear. NECK:  No jugular venous distention, carotid upstroke brisk and symmetric, no bruits, no thyromegaly or adenopathy LUNGS:  Clear to auscultation bilaterally CHEST:  Unremarkable HEART:  RRR,  PMI not displaced or sustained,S1 and S2 within normal limits, no S3, no S4: no clicks, no rubs, no murmurs ABD:  Soft, nontender. BS +, no masses or bruits. No hepatomegaly, no splenomegaly EXT:  2 + pulses throughout, no edema, no cyanosis no clubbing SKIN:  Warm and dry.  No rashes NEURO:  Alert and oriented x 3. Cranial nerves II through XII intact. PSYCH:  Cognitively intact    LABORATORY DATA: Lab Results  Component Value Date   WBC 5.9 02/10/2017   HGB 13.3 02/10/2017   HCT 40.5 02/10/2017   PLT 245.0 02/10/2017   GLUCOSE 96 02/10/2017   CHOL 222 (H) 02/10/2017   TRIG 52.0 02/10/2017   HDL 88.30 02/10/2017   LDLDIRECT 105.8 09/07/2012   LDLCALC 123 (H) 02/10/2017   ALT 19 02/10/2017   AST 25 02/10/2017   NA 138 02/10/2017   K 4.5 02/10/2017   CL 101 02/10/2017   CREATININE 0.68 02/10/2017   BUN 14 02/10/2017   CO2 29 02/10/2017   TSH 2.59 02/10/2017   INR 1.0 11/23/2015     Assessment / Plan: 1. Paroxysmal atrial fibrillation. Patient is asymptomatic.  Low burden. Continue on Apixaban 2.5 mg twice a day.  2. Mobitz type 2 second degree AV block status post pacemaker implant. Pacer checks are satisfactory.   3. Hypertension, controlled.   4. Hyperlipidemia. Patient developed muscle fatigue on lipitor. Discussed alternative therapy and will try Zetia 10 mg daily. She is 84 yrs old and we are talking about primary prevention so I don't know that we need to be all that aggressive.   Follow up in 6 months.

## 2017-04-24 ENCOUNTER — Encounter: Payer: Self-pay | Admitting: Cardiology

## 2017-04-24 ENCOUNTER — Other Ambulatory Visit: Payer: Self-pay | Admitting: Internal Medicine

## 2017-04-24 ENCOUNTER — Ambulatory Visit: Payer: Medicare Other | Admitting: Cardiology

## 2017-04-24 VITALS — BP 120/62 | HR 64 | Ht 65.0 in | Wt 124.4 lb

## 2017-04-24 DIAGNOSIS — I10 Essential (primary) hypertension: Secondary | ICD-10-CM

## 2017-04-24 DIAGNOSIS — E7800 Pure hypercholesterolemia, unspecified: Secondary | ICD-10-CM | POA: Diagnosis not present

## 2017-04-24 DIAGNOSIS — I441 Atrioventricular block, second degree: Secondary | ICD-10-CM

## 2017-04-24 DIAGNOSIS — I480 Paroxysmal atrial fibrillation: Secondary | ICD-10-CM

## 2017-04-24 DIAGNOSIS — Z139 Encounter for screening, unspecified: Secondary | ICD-10-CM

## 2017-04-24 MED ORDER — EZETIMIBE 10 MG PO TABS: 10.0000 mg | ORAL_TABLET | Freq: Every day | ORAL | 3 refills | Status: DC

## 2017-04-24 NOTE — Patient Instructions (Signed)
Stay off lipitor  We will try Zetia 10 mg daily  Continue your other therapy

## 2017-05-15 ENCOUNTER — Ambulatory Visit (INDEPENDENT_AMBULATORY_CARE_PROVIDER_SITE_OTHER): Payer: Medicare Other | Admitting: *Deleted

## 2017-05-15 DIAGNOSIS — I441 Atrioventricular block, second degree: Secondary | ICD-10-CM

## 2017-05-15 NOTE — Progress Notes (Signed)
Remote pacemaker transmission.   

## 2017-05-17 ENCOUNTER — Encounter: Payer: Self-pay | Admitting: Cardiology

## 2017-05-23 LAB — CUP PACEART REMOTE DEVICE CHECK
Battery Impedance: 100 Ohm
Battery Remaining Longevity: 159 mo
Battery Voltage: 2.79 V
Brady Statistic AP VP Percent: 0 %
Brady Statistic AS VS Percent: 98 %
Date Time Interrogation Session: 20190211135302
Implantable Lead Implant Date: 20080318
Implantable Lead Implant Date: 20080318
Implantable Lead Location: 753859
Implantable Lead Model: 5076
Implantable Pulse Generator Implant Date: 20170825
Lead Channel Impedance Value: 459 Ohm
Lead Channel Setting Pacing Amplitude: 2 V
Lead Channel Setting Pacing Amplitude: 2.5 V
Lead Channel Setting Pacing Pulse Width: 0.4 ms
Lead Channel Setting Sensing Sensitivity: 5.6 mV
MDC IDC LEAD LOCATION: 753860
MDC IDC MSMT LEADCHNL RA PACING THRESHOLD AMPLITUDE: 0.375 V
MDC IDC MSMT LEADCHNL RA PACING THRESHOLD PULSEWIDTH: 0.4 ms
MDC IDC MSMT LEADCHNL RV IMPEDANCE VALUE: 552 Ohm
MDC IDC MSMT LEADCHNL RV PACING THRESHOLD AMPLITUDE: 0.75 V
MDC IDC MSMT LEADCHNL RV PACING THRESHOLD PULSEWIDTH: 0.4 ms
MDC IDC STAT BRADY AP VS PERCENT: 2 %
MDC IDC STAT BRADY AS VP PERCENT: 0 %

## 2017-06-02 ENCOUNTER — Ambulatory Visit
Admission: RE | Admit: 2017-06-02 | Discharge: 2017-06-02 | Disposition: A | Discharge status: Home / Self Care | Payer: Medicare Other | Source: Ambulatory Visit | Attending: Internal Medicine | Admitting: Internal Medicine

## 2017-06-02 DIAGNOSIS — Z139 Encounter for screening, unspecified: Secondary | ICD-10-CM

## 2017-06-26 ENCOUNTER — Other Ambulatory Visit: Payer: Self-pay | Admitting: Cardiology

## 2017-08-14 ENCOUNTER — Ambulatory Visit (INDEPENDENT_AMBULATORY_CARE_PROVIDER_SITE_OTHER): Payer: Medicare Other | Admitting: *Deleted

## 2017-08-14 DIAGNOSIS — I442 Atrioventricular block, complete: Secondary | ICD-10-CM | POA: Diagnosis not present

## 2017-08-15 LAB — CUP PACEART REMOTE DEVICE CHECK
Battery Impedance: 111 Ohm
Brady Statistic AP VP Percent: 0 %
Brady Statistic AP VS Percent: 3 %
Brady Statistic AS VP Percent: 0 %
Brady Statistic AS VS Percent: 97 %
Implantable Lead Implant Date: 20080318
Implantable Lead Location: 753859
Implantable Lead Location: 753860
Implantable Lead Model: 5076
Implantable Lead Model: 5076
Lead Channel Pacing Threshold Amplitude: 0.5 V
Lead Channel Pacing Threshold Pulse Width: 0.4 ms
Lead Channel Sensing Intrinsic Amplitude: 2.8 mV
Lead Channel Setting Pacing Amplitude: 2.5 V
Lead Channel Setting Pacing Pulse Width: 0.4 ms
MDC IDC LEAD IMPLANT DT: 20080318
MDC IDC MSMT BATTERY REMAINING LONGEVITY: 156 mo
MDC IDC MSMT BATTERY VOLTAGE: 2.79 V
MDC IDC MSMT LEADCHNL RA IMPEDANCE VALUE: 453 Ohm
MDC IDC MSMT LEADCHNL RV IMPEDANCE VALUE: 542 Ohm
MDC IDC MSMT LEADCHNL RV PACING THRESHOLD AMPLITUDE: 0.875 V
MDC IDC MSMT LEADCHNL RV PACING THRESHOLD PULSEWIDTH: 0.4 ms
MDC IDC MSMT LEADCHNL RV SENSING INTR AMPL: 11.2 mV
MDC IDC PG IMPLANT DT: 20170825
MDC IDC SESS DTM: 20190513125135
MDC IDC SET LEADCHNL RA PACING AMPLITUDE: 2 V
MDC IDC SET LEADCHNL RV SENSING SENSITIVITY: 5.6 mV

## 2017-08-15 NOTE — Progress Notes (Signed)
Remote pacemaker transmission.   

## 2017-08-16 ENCOUNTER — Encounter: Payer: Self-pay | Admitting: Cardiology

## 2017-09-11 ENCOUNTER — Other Ambulatory Visit: Payer: Self-pay | Admitting: Cardiology

## 2017-09-11 DIAGNOSIS — I480 Paroxysmal atrial fibrillation: Secondary | ICD-10-CM

## 2017-11-13 ENCOUNTER — Ambulatory Visit (INDEPENDENT_AMBULATORY_CARE_PROVIDER_SITE_OTHER): Payer: Medicare Other | Admitting: *Deleted

## 2017-11-13 DIAGNOSIS — I442 Atrioventricular block, complete: Secondary | ICD-10-CM | POA: Diagnosis not present

## 2017-11-13 NOTE — Progress Notes (Signed)
Remote pacemaker transmission.   

## 2017-11-14 ENCOUNTER — Encounter: Payer: Self-pay | Admitting: Cardiology

## 2017-11-24 ENCOUNTER — Encounter: Payer: Self-pay | Admitting: Internal Medicine

## 2017-11-24 LAB — HM DIABETES EYE EXAM

## 2017-11-26 NOTE — Progress Notes (Signed)
Peggy Mendez Date of Birth: 1926-12-17 Medical Record #[id number]  History of Present Illness: Peggy Mendez is seen for followup of Pacemaker and atrial fibrillation. She has a history of symptomatic AV block and is status post pacemaker implant. On pacemaker followup she was found to have paroxysmal atrial fibrillation.  No dizziness or syncope. She is on anticoagulation with Eliquis. Dose reduced for age and renal function. Her last pacemaker evaluation November 2018 showed some Afib but burden of 0.4%.  No sustained ventricular high rates. She had generator change out of her pacemaker in August 2017.   She is doing very well from a cardiac standpoint. Is completely unaware of AFib. Pacer check in May showed she has this about 12% of the time. She continues to have issues with knee pain and some instability of the knee. Is getting an ortho evaluation but wants to avoid surgery.   Current Outpatient Medications on File Prior to Visit  Medication Sig Dispense Refill  . Calcium Carbonate-Vitamin Peggy (CALCIUM 600/VITAMIN Peggy PO) Take 1 capsule by mouth daily.    . cholecalciferol (VITAMIN Peggy) 1000 units tablet Take 2,000 Units by mouth daily.    . Diclofenac Sodium 1.5 % SOLN Place 1 drop onto the skin as directed.    Marland Kitchen ELIQUIS 2.5 MG TABS tablet TAKE 1 TABLET BY MOUTH TWO  TIMES DAILY 180 tablet 1  . ezetimibe (ZETIA) 10 MG tablet Take 1 tablet (10 mg total) by mouth daily. 90 tablet 3  . losartan (COZAAR) 50 MG tablet TAKE 1 TABLET BY MOUTH  EVERY DAY 90 tablet 3  . Misc Natural Products (TART CHERRY ADVANCED) CAPS Take 1 capsule by mouth daily.    . multivitamin (THERAGRAN) per tablet Take 1 tablet by mouth daily.      . Omega-3 Fatty Acids (FISH OIL) 1200 MG CAPS Take one tablet by mouth daily     No current facility-administered medications on file prior to visit.     Allergies  Allergen Reactions  . Anesthetics, Ester     Anesthetics not particularly Ester - nausea  . Lisinopril Cough   . Other Dermatitis and Rash    Tegaderm (clear occlusive dressing) Pt was miserable    Past Medical History:  Diagnosis Date  . Cataract    REMOVED  . History of colonic polyps   . Hyperlipidemia   . Hypertension   . Osteopenia   . Paroxysmal atrial fibrillation (HCC)   . Sick sinus syndrome ([place])   . Skin cancer 2010   Left leg, SCC, sees derm  . Syncope 11/2005    Past Surgical History:  Procedure Laterality Date  . APPENDECTOMY    . BREAST BIOPSY Bilateral    several, 11/2014 showed fibrocystic changes, no evidence of malignancy  . BREAST BIOPSY Right 11-2015  . cataracts Bilateral   . COLONOSCOPY W/ BIOPSIES    . EP IMPLANTABLE DEVICE N/A 11/27/2015   MDT Adapta L gen change by Dr Rayann Heman  . HEMORRHOID SURGERY    . PPM  2008   MDT dual chamber PPM implanted by Dr Verlon Setting for sick sinus and intermittent CHB  . TOTAL ABDOMINAL HYSTERECTOMY     oophorectomy  . US ECHOCARDIOGRAPHY  12/08/2005   EF 55-60%    Social History   Tobacco Use  Smoking Status Former Smoker  . Types: Cigarettes  . Last attempt to quit: 08/23/1988  . Years since quitting: 29.2  Smokeless Tobacco Never Used  Tobacco Comment   >20  yrs ago, used to smoke ~ 1 ppd    Social History   Substance and Sexual Activity  Alcohol Use Yes   Comment: socially    Family History  Problem Relation Age of Onset  . Breast cancer Sister   . Hypertension Sister   . Heart failure Mother   . Heart disease Father   . Coronary artery disease Other        F, brothers x 2other family members  . Prostate cancer Brother   . Colon cancer Neg Hx   . Diabetes Neg Hx     Review of Systems: As noted in history of present illness.  All other systems were reviewed and are negative.  Physical Exam: BP (!) 146/70   Pulse 70   Ht 5' 5.5" (1.664 m)   Wt 122 lb 9.6 oz (55.6 kg)   SpO2 97%   BMI 20.09 kg/m  GENERAL:  Well appearing, elderly WF in NAD HEENT:  PERRL, EOMI, sclera are clear. Oropharynx is  clear. NECK:  No jugular venous distention, carotid upstroke brisk and symmetric, no bruits, no thyromegaly or adenopathy LUNGS:  Clear to auscultation bilaterally CHEST:  Unremarkable HEART:  RRR,  PMI not displaced or sustained,S1 and S2 within normal limits, no S3, no S4: no clicks, no rubs, no murmurs ABD:  Soft, nontender. BS +, no masses or bruits. No hepatomegaly, no splenomegaly EXT:  2 + pulses throughout, no edema, no cyanosis no clubbing SKIN:  Warm and dry.  No rashes NEURO:  Alert and oriented x 3. Cranial nerves II through XII intact. PSYCH:  Cognitively intact    LABORATORY DATA: Lab Results  Component Value Date   WBC 5.9 02/10/2017   HGB 13.3 02/10/2017   HCT 40.5 02/10/2017   PLT 245.0 02/10/2017   GLUCOSE 96 02/10/2017   CHOL 222 (H) 02/10/2017   TRIG 52.0 02/10/2017   HDL 88.30 02/10/2017   LDLDIRECT 105.8 09/07/2012   LDLCALC 123 (H) 02/10/2017   ALT 19 02/10/2017   AST 25 02/10/2017   NA 138 02/10/2017   K 4.5 02/10/2017   CL 101 02/10/2017   CREATININE 0.68 02/10/2017   BUN 14 02/10/2017   CO2 29 02/10/2017   TSH 2.59 02/10/2017   INR 1.0 11/23/2015     Assessment / Plan: 1. Paroxysmal atrial fibrillation. Patient is asymptomatic.  Low burden. Continue on Apixaban 2.5 mg twice a day. Will follow up chemistries and CBC today. Also check TSH.  2. Mobitz type 2 second degree AV block status post pacemaker implant. Pacer checks are satisfactory.   3. Hypertension, controlled.   4. Hyperlipidemia. Intolerant to statins. On Zetia. Will check lipid panel today.  Follow up in 6 months.

## 2017-12-01 ENCOUNTER — Encounter: Payer: Self-pay | Admitting: Cardiology

## 2017-12-01 ENCOUNTER — Ambulatory Visit: Payer: Medicare Other | Admitting: Cardiology

## 2017-12-01 VITALS — BP 146/70 | HR 70 | Ht 65.5 in | Wt 122.6 lb

## 2017-12-01 DIAGNOSIS — E7800 Pure hypercholesterolemia, unspecified: Secondary | ICD-10-CM | POA: Diagnosis not present

## 2017-12-01 DIAGNOSIS — I442 Atrioventricular block, complete: Secondary | ICD-10-CM

## 2017-12-01 DIAGNOSIS — I480 Paroxysmal atrial fibrillation: Secondary | ICD-10-CM | POA: Diagnosis not present

## 2017-12-01 DIAGNOSIS — I10 Essential (primary) hypertension: Secondary | ICD-10-CM

## 2017-12-01 NOTE — Patient Instructions (Signed)
Continue your current therapy  I will see you in 6 months.   

## 2017-12-02 LAB — COMPREHENSIVE METABOLIC PANEL
ALT: 18 IU/L (ref 0–32)
AST: 25 IU/L (ref 0–40)
Albumin/Globulin Ratio: 1.7 (ref 1.2–2.2)
Albumin: 4.3 g/dL (ref 3.5–4.7)
Alkaline Phosphatase: 77 IU/L (ref 39–117)
BUN/Creatinine Ratio: 15 (ref 12–28)
BUN: 11 mg/dL (ref 8–27)
Bilirubin Total: 0.5 mg/dL (ref 0.0–1.2)
CO2: 23 mmol/L (ref 20–29)
Calcium: 9.9 mg/dL (ref 8.7–10.3)
Chloride: 103 mmol/L (ref 96–106)
Creatinine, Ser: 0.71 mg/dL (ref 0.57–1.00)
GFR calc Af Amer: 87 mL/min/{1.73_m2} (ref >59)
GFR calc non Af Amer: 76 mL/min/{1.73_m2} (ref >59)
Globulin, Total: 2.6 g/dL (ref 1.5–4.5)
Glucose: 101 mg/dL — ABNORMAL HIGH (ref 65–99)
Potassium: 4.5 mmol/L (ref 3.5–5.2)
Sodium: 141 mmol/L (ref 134–144)
Total Protein: 6.9 g/dL (ref 6.0–8.5)

## 2017-12-02 LAB — CBC WITH DIFFERENTIAL/PLATELET
Basophils Absolute: 0 10*3/uL (ref 0.0–0.2)
Basos: 1 %
EOS (ABSOLUTE): 0.1 10*3/uL (ref 0.0–0.4)
EOS: 1 %
HEMATOCRIT: 40.3 % (ref 34.0–46.6)
Hemoglobin: 13.4 g/dL (ref 11.1–15.9)
Immature Grans (Abs): 0 10*3/uL (ref 0.0–0.1)
Immature Granulocytes: 0 %
LYMPHS ABS: 2.2 10*3/uL (ref 0.7–3.1)
Lymphs: 31 %
MCH: 28.5 pg (ref 26.6–33.0)
MCHC: 33.3 g/dL (ref 31.5–35.7)
MCV: 86 fL (ref 79–97)
MONOS ABS: 0.8 10*3/uL (ref 0.1–0.9)
Monocytes: 11 %
Neutrophils Absolute: 3.8 10*3/uL (ref 1.4–7.0)
Neutrophils: 56 %
PLATELETS: 236 10*3/uL (ref 150–450)
RBC: 4.71 x10E6/uL (ref 3.77–5.28)
RDW: 12.9 % (ref 12.3–15.4)
WBC: 6.9 10*3/uL (ref 3.4–10.8)

## 2017-12-02 LAB — LIPID PANEL W/O CHOL/HDL RATIO
Cholesterol, Total: 220 mg/dL — ABNORMAL HIGH (ref 100–199)
HDL: 108 mg/dL (ref >39)
LDL Calculated: 101 mg/dL — ABNORMAL HIGH (ref 0–99)
Triglycerides: 54 mg/dL (ref 0–149)
VLDL Cholesterol Cal: 11 mg/dL (ref 5–40)

## 2017-12-02 LAB — TSH: TSH: 2.92 u[IU]/mL (ref 0.450–4.500)

## 2017-12-03 ENCOUNTER — Other Ambulatory Visit: Payer: Self-pay | Admitting: Cardiology

## 2017-12-03 DIAGNOSIS — I480 Paroxysmal atrial fibrillation: Secondary | ICD-10-CM

## 2017-12-07 LAB — CUP PACEART REMOTE DEVICE CHECK
Battery Impedance: 112 Ohm
Battery Remaining Longevity: 156 mo
Brady Statistic AP VP Percent: 0 %
Brady Statistic AS VS Percent: 96 %
Date Time Interrogation Session: 20190812131935
Implantable Lead Implant Date: 20080318
Implantable Lead Location: 753859
Implantable Lead Location: 753860
Implantable Pulse Generator Implant Date: 20170825
Lead Channel Impedance Value: 473 Ohm
Lead Channel Impedance Value: 509 Ohm
Lead Channel Pacing Threshold Amplitude: 0.5 V
Lead Channel Pacing Threshold Amplitude: 1 V
Lead Channel Pacing Threshold Pulse Width: 0.4 ms
Lead Channel Setting Pacing Amplitude: 2 V
Lead Channel Setting Sensing Sensitivity: 5.6 mV
MDC IDC LEAD IMPLANT DT: 20080318
MDC IDC MSMT BATTERY VOLTAGE: 2.79 V
MDC IDC MSMT LEADCHNL RA PACING THRESHOLD PULSEWIDTH: 0.4 ms
MDC IDC SET LEADCHNL RV PACING AMPLITUDE: 2.5 V
MDC IDC SET LEADCHNL RV PACING PULSEWIDTH: 0.4 ms
MDC IDC STAT BRADY AP VS PERCENT: 4 %
MDC IDC STAT BRADY AS VP PERCENT: 0 %

## 2018-02-07 ENCOUNTER — Ambulatory Visit: Payer: Medicare Other | Admitting: Internal Medicine

## 2018-02-07 ENCOUNTER — Encounter: Payer: Self-pay | Admitting: Internal Medicine

## 2018-02-07 VITALS — BP 130/70 | HR 71 | Ht 65.0 in | Wt 122.0 lb

## 2018-02-07 DIAGNOSIS — I480 Paroxysmal atrial fibrillation: Secondary | ICD-10-CM

## 2018-02-07 DIAGNOSIS — I495 Sick sinus syndrome: Secondary | ICD-10-CM | POA: Diagnosis not present

## 2018-02-07 DIAGNOSIS — I10 Essential (primary) hypertension: Secondary | ICD-10-CM | POA: Diagnosis not present

## 2018-02-07 NOTE — Patient Instructions (Signed)
Medication Instructions:  Your physician recommends that you continue on your current medications as directed. Please refer to the Current Medication list given to you today.  If you need a refill on your cardiac medications before your next appointment, please call your pharmacy.   Lab work: None Ordered  If you have labs (blood work) drawn today and your tests are completely normal, you will receive your results only by: Marland Kitchen MyChart Message (if you have MyChart) OR . A paper copy in the mail If you have any lab test that is abnormal or we need to change your treatment, we will call you to review the results.  Testing/Procedures: None ordered  Follow-Up: Remote monitoring is used to monitor your Pacemaker from home. This monitoring reduces the number of office visits required to check your device to one time per year. It allows [place] to keep an eye on the functioning of your device to ensure it is working properly. You are scheduled for a device check from home on 02/12/18. You may send your transmission at any time that day. If you have a wireless device, the transmission will be sent automatically. After your physician reviews your transmission, you will receive a postcard with your next transmission date.  . Your physician wants you to follow-up in: 1 year with Dr. Rayann Heman. You will receive a reminder letter in the mail two months in advance. If you don't receive a letter, please call our office to schedule the follow-up appointment.   Any Other Special Instructions Will Be Listed Below (If Applicable).

## 2018-02-07 NOTE — Progress Notes (Signed)
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  Since last being seen in our clinic, the patient reports doing very well.  Today, she denies symptoms of palpitations, chest pain, shortness of breath,  lower extremity edema, dizziness, presyncope, or syncope.  The patient is otherwise without complaint today.   Past Medical History:  Diagnosis Date  . Cataract    REMOVED  . History of colonic polyps   . Hyperlipidemia   . Hypertension   . Osteopenia   . Paroxysmal atrial fibrillation (HCC)   . Sick sinus syndrome ([place])   . Skin cancer 2010   Left leg, SCC, sees derm  . Syncope 11/2005   Past Surgical History:  Procedure Laterality Date  . APPENDECTOMY    . BREAST BIOPSY Bilateral    several, 11/2014 showed fibrocystic changes, no evidence of malignancy  . BREAST BIOPSY Right 11-2015  . cataracts Bilateral   . COLONOSCOPY W/ BIOPSIES    . EP IMPLANTABLE DEVICE N/A 11/27/2015   MDT Adapta L gen change by Dr Rayann Heman  . HEMORRHOID SURGERY    . PPM  2008   MDT dual chamber PPM implanted by Dr Verlon Setting for sick sinus and intermittent CHB  . TOTAL ABDOMINAL HYSTERECTOMY     oophorectomy  . US ECHOCARDIOGRAPHY  12/08/2005   EF 55-60%    ROS- all systems are reviewed and negative except as per HPI above  Current Outpatient Medications  Medication Sig Dispense Refill  . Calcium Carbonate-Vitamin D (CALCIUM 600/VITAMIN D PO) Take 1 capsule by mouth daily.    . cholecalciferol (VITAMIN D) 1000 units tablet Take 2,000 Units by mouth daily.    . Diclofenac Sodium 1.5 % SOLN Place 1 drop onto the skin as directed.    Marland Kitchen ELIQUIS 2.5 MG TABS tablet TAKE 1 TABLET BY MOUTH TWO  TIMES DAILY 180 tablet 1  . ezetimibe (ZETIA) 10 MG tablet TAKE 1 TABLET BY MOUTH  DAILY 90 tablet 1  . losartan (COZAAR) 50 MG tablet TAKE 1 TABLET BY MOUTH  EVERY DAY 90 tablet 3  . Misc Natural  Products (TART CHERRY ADVANCED) CAPS Take 1 capsule by mouth daily.    . multivitamin (THERAGRAN) per tablet Take 1 tablet by mouth daily.      . Omega-3 Fatty Acids (FISH OIL) 1200 MG CAPS Take one tablet by mouth daily     No current facility-administered medications for this visit.     Physical Exam: Vitals:   02/07/18 1130  BP: 130/70  Pulse: 71  SpO2: 96%  Weight: 122 lb (55.3 kg)  Height: 5\' 5"  (1.651 m)    GEN- The patient is well appearing, alert and oriented x 3 today.   Head- normocephalic, atraumatic Eyes-  Sclera clear, conjunctiva pink Ears- hearing intact Oropharynx- clear Lungs- Clear to ausculation bilaterally, normal work of breathing Chest- pacemaker pocket is well healed Heart- Regular rate and rhythm, no murmurs, rubs or gallops, PMI not laterally displaced GI- soft, NT, ND, + BS Extremities- no clubbing, cyanosis, or edema  Pacemaker interrogation- reviewed in detail today,  See PACEART report  ekg tracing ordered today is personally reviewed and shows sinus rhythm 71 bpm, PR 194 msec, QRS 84 msec, Qtc 421 msec  Assessment and Plan:  1. Symptomatic sinus bradycardia  Normal pacemaker function A paces 4%,  Does not V pace See  Pace Art report No changes today  2. Paroxysmal atrial fibrillation afib burden is 6.1% (previously 0.2%) asymptomatic On eliquis for chads2vasc score of 4.  3. HTN Stable No change required today   [facility] Return in a year (has declined EP APP in the past) Follow-up with Dr Peggy Mendez as scheduled  Peggy Grayer MD, [facility] 02/07/2018 11:50 AM

## 2018-02-09 LAB — CUP PACEART INCLINIC DEVICE CHECK
Date Time Interrogation Session: 20191108103619
Implantable Lead Location: 753859
MDC IDC LEAD IMPLANT DT: 20080318
MDC IDC LEAD IMPLANT DT: 20080318
MDC IDC LEAD LOCATION: 753860
MDC IDC PG IMPLANT DT: 20170825

## 2018-02-12 ENCOUNTER — Ambulatory Visit (INDEPENDENT_AMBULATORY_CARE_PROVIDER_SITE_OTHER): Payer: Medicare Other | Admitting: *Deleted

## 2018-02-12 DIAGNOSIS — I441 Atrioventricular block, second degree: Secondary | ICD-10-CM | POA: Diagnosis not present

## 2018-02-12 DIAGNOSIS — I495 Sick sinus syndrome: Secondary | ICD-10-CM

## 2018-02-12 NOTE — Progress Notes (Signed)
Remote pacemaker transmission.   

## 2018-02-16 ENCOUNTER — Ambulatory Visit (INDEPENDENT_AMBULATORY_CARE_PROVIDER_SITE_OTHER): Payer: Medicare Other | Admitting: Internal Medicine

## 2018-02-16 ENCOUNTER — Encounter: Payer: Self-pay | Admitting: Internal Medicine

## 2018-02-16 VITALS — BP 122/78 | HR 59 | Temp 97.9°F | Resp 16 | Ht 65.0 in | Wt 124.0 lb

## 2018-02-16 DIAGNOSIS — Z0000 Encounter for general adult medical examination without abnormal findings: Secondary | ICD-10-CM

## 2018-02-16 MED ORDER — ZOSTER VAC RECOMB ADJUVANTED 50 MCG/0.5ML IM SUSR: 0.5000 mL | Freq: Once | INTRAMUSCULAR | 1 refills | Status: AC

## 2018-02-16 NOTE — Assessment & Plan Note (Addendum)
-  Td  2016;  Had a shingles shot 2011;  Pneumonia shot 2005 and 02-2017;  Prevnar: 2015 Shingrix rx printed  Had a flu shot -no further screening for cervical ca -h/o abnormal MMG and breast Bx; breast bx neg 11-2014;  MMG 06-2017  -CCS: Cscope 8-09, 2 polyps;  Cscope 8-14, + polyps, no further scopes  -Abd u/s 2013 (-) for AAA  - She continue eating healthy, remains active and lives independently. - labs reviewed, all ok

## 2018-02-16 NOTE — Patient Instructions (Addendum)
Please schedule Medicare Wellness with Glenard Haring.   GO TO THE FRONT DESK Schedule your next appointment for a  Physical exam in 1 year

## 2018-02-16 NOTE — Progress Notes (Signed)
Pre visit review using our clinic review tool, if applicable. No additional management support is needed unless otherwise documented below in the visit note. 

## 2018-02-16 NOTE — Progress Notes (Signed)
Subjective:    Patient ID: Peggy Mendez, female    DOB: 1927/03/20, 84 y.o.   MRN: [id number]  DOS:  02/16/2018 Type of visit - description : cpx Interval history: Since the last office visit she is doing well, saw cardiology, notes reviewed   Review of Systems Continue with DJD symptoms mostly at the left knee.  Sees orthopedic surgery  Other than above, a 14 point review of systems is negative     Past Medical History:  Diagnosis Date  . Cataract    REMOVED  . History of colonic polyps   . Hyperlipidemia   . Hypertension   . Osteopenia   . Paroxysmal atrial fibrillation (HCC)   . Sick sinus syndrome ([place])   . Skin cancer 2010   Left leg, SCC, sees derm  . Syncope 11/2005    Past Surgical History:  Procedure Laterality Date  . APPENDECTOMY    . BREAST BIOPSY Bilateral    several, 11/2014 showed fibrocystic changes, no evidence of malignancy  . BREAST BIOPSY Right 11-2015  . cataracts Bilateral   . COLONOSCOPY W/ BIOPSIES    . EP IMPLANTABLE DEVICE N/A 11/27/2015   MDT Adapta L gen change by Dr Rayann Heman  . HEMORRHOID SURGERY    . PPM  2008   MDT dual chamber PPM implanted by Dr Verlon Setting for sick sinus and intermittent CHB  . TOTAL ABDOMINAL HYSTERECTOMY     oophorectomy  . US ECHOCARDIOGRAPHY  12/08/2005   EF 55-60%    Social History   Socioeconomic History  . Marital status: Single    Spouse name: Not on file  . Number of children: 0  . Years of education: Not on file  . Highest education level: Not on file  Occupational History  . Occupation: retired     Fish farm manager: RETIRED  Social Needs  . Financial resource strain: Not on file  . Food insecurity:    Worry: Not on file    Inability: Not on file  . Transportation needs:    Medical: Not on file    Non-medical: Not on file  Tobacco Use  . Smoking status: Former Smoker    Types: Cigarettes    Last attempt to quit: 08/23/1988    Years since quitting: 29.5  . Smokeless tobacco: Never Used  . Tobacco  comment: >20 yrs ago, used to smoke ~ 1 ppd  Substance and Sexual Activity  . Alcohol use: Yes    Comment: socially  . Drug use: No  . Sexual activity: Not on file  Lifestyle  . Physical activity:    Days per week: Not on file    Minutes per session: Not on file  . Stress: Not on file  Relationships  . Social connections:    Talks on phone: Not on file    Gets together: Not on file    Attends religious service: Not on file    Active member of club or organization: Not on file    Attends meetings of clubs or organizations: Not on file    Relationship status: Not on file  . Intimate partner violence:    Fear of current or ex partner: Not on file    Emotionally abused: Not on file    Physically abused: Not on file    Forced sexual activity: Not on file  Other Topics Concern  . Not on file  Social History Narrative   Lives by herself,  still drives   Has a twin  sister in [place], she has dementia,   a nephew in [place].     Family History  Problem Relation Age of Onset  . Breast cancer Sister   . Hypertension Sister   . Heart failure Mother   . Heart disease Father   . Coronary artery disease Other        F, brothers x 2other family members  . Prostate cancer Brother   . Colon cancer Neg Hx   . Diabetes Neg Hx      Allergies as of 02/16/2018      Reactions   Anesthetics, Ester    Anesthetics not particularly Ester - nausea   Lisinopril Cough   Other Dermatitis, Rash   Tegaderm (clear occlusive dressing) Pt was miserable      Medication List        Accurate as of 02/16/18 11:59 PM. Always use your most recent med list.          CALCIUM 600/VITAMIN Peggy PO Take 1 capsule by mouth daily.   cholecalciferol 1000 units tablet Commonly known as:  VITAMIN Peggy Take 2,000 Units by mouth daily.   Diclofenac Sodium 1.5 % Soln Place 1 drop onto the skin as directed.   ELIQUIS 2.5 MG Tabs tablet Generic drug:  apixaban TAKE 1 TABLET BY MOUTH TWO  TIMES DAILY     ezetimibe 10 MG tablet Commonly known as:  ZETIA TAKE 1 TABLET BY MOUTH  DAILY   Fish Oil 1200 MG Caps Take one tablet by mouth daily   losartan 50 MG tablet Commonly known as:  COZAAR TAKE 1 TABLET BY MOUTH  EVERY DAY   multivitamin per tablet Take 1 tablet by mouth daily.   TART CHERRY ADVANCED Caps Take 1 capsule by mouth daily.   Zoster Vaccine Adjuvanted injection Commonly known as:  SHINGRIX Inject 0.5 mLs into the muscle once for 1 dose.          Objective:   Physical Exam BP 122/78 (BP Location: Left Arm, Patient Position: Sitting, Cuff Size: Small)   Pulse (!) 59   Temp 97.9 F (36.6 C) (Oral)   Resp 16   Ht 5\' 5"  (1.651 m)   Wt 124 lb (56.2 kg)   SpO2 97%   BMI 20.63 kg/m      General: Well developed, NAD, BMI noted Neck: No  thyromegaly  HEENT:  Normocephalic . Face symmetric, atraumatic Lungs:  CTA B Normal respiratory effort, no intercostal retractions, no accessory muscle use. Heart: RRR,  no murmur.  No pretibial edema bilaterally  Abdomen:  Not distended, soft, non-tender. No rebound or rigidity.   Skin: Exposed areas without rash. Not pale. Not jaundice Neurologic:  alert & oriented X3.  Speech normal, gait unassisted, somewhat limited by DJD, needed help transferring. Strength symmetric and appropriate for age.  Psych: Cognition and judgment appear intact.  Cooperative with normal attention span and concentration.  Behavior appropriate. No anxious or depressed appearing.  Assessment & Plan:   Assessment  HTN Hyperlipidemia CV: --Paroxysmal A. Fib >> ELIQUIS --Pacemaker for Mobitz 2 --Syncope 2007, ECHO aortic sclerosis, saw cardiology Osteopenia DEA 08-2006 and 10-2008 -----T score at the spine normal. DEXA 02-2011 T score of the hips was -2.0, -2.1, mild osteopenia. Declined rx as of 02/2018 DJD  Skin cancer, SCC, left leg, sees dermatology FH breast ca -sister, brother CHF  Plan  HTN: Seems well controlled on  losartan. Hyperlipidemia: Cardiology discontinue Lipitor at patient request due to aches and pains, now  on Zetia, last LDL satisfactory.  No major improving in pain. Paroxysmal A. fib: Saw Dr. Rayann Heman recently, stable. Osteopenia: On calcium and vitamin Peggy, declined  any other treatment RTC 1 year

## 2018-02-18 NOTE — Assessment & Plan Note (Signed)
HTN: Seems well controlled on losartan. Hyperlipidemia: Cardiology discontinue Lipitor at patient request due to aches and pains, now on Zetia, last LDL satisfactory.  No major improving in pain. Paroxysmal A. fib: Saw Dr. Rayann Heman recently, stable. Osteopenia: On calcium and vitamin D, declined  any other treatment RTC 1 year

## 2018-02-26 ENCOUNTER — Ambulatory Visit: Payer: Medicare Other | Admitting: Family

## 2018-03-05 ENCOUNTER — Encounter (HOSPITAL_BASED_OUTPATIENT_CLINIC_OR_DEPARTMENT_OTHER): Payer: Self-pay | Admitting: Emergency Medicine

## 2018-03-05 ENCOUNTER — Other Ambulatory Visit: Payer: Self-pay

## 2018-03-05 ENCOUNTER — Emergency Department (HOSPITAL_BASED_OUTPATIENT_CLINIC_OR_DEPARTMENT_OTHER)
Admission: EM | Admit: 2018-03-05 | Discharge: 2018-03-05 | Disposition: A | Discharge status: Home / Self Care | Payer: Medicare Other | Attending: Emergency Medicine | Admitting: Emergency Medicine

## 2018-03-05 ENCOUNTER — Emergency Department (HOSPITAL_BASED_OUTPATIENT_CLINIC_OR_DEPARTMENT_OTHER): Payer: Medicare Other

## 2018-03-05 DIAGNOSIS — Z950 Presence of cardiac pacemaker: Secondary | ICD-10-CM | POA: Diagnosis not present

## 2018-03-05 DIAGNOSIS — Z79899 Other long term (current) drug therapy: Secondary | ICD-10-CM | POA: Diagnosis not present

## 2018-03-05 DIAGNOSIS — W19XXXD Unspecified fall, subsequent encounter: Secondary | ICD-10-CM

## 2018-03-05 DIAGNOSIS — M25532 Pain in left wrist: Secondary | ICD-10-CM | POA: Diagnosis not present

## 2018-03-05 DIAGNOSIS — Z87891 Personal history of nicotine dependence: Secondary | ICD-10-CM | POA: Diagnosis not present

## 2018-03-05 DIAGNOSIS — W010XXD Fall on same level from slipping, tripping and stumbling without subsequent striking against object, subsequent encounter: Secondary | ICD-10-CM | POA: Insufficient documentation

## 2018-03-05 DIAGNOSIS — I10 Essential (primary) hypertension: Secondary | ICD-10-CM | POA: Insufficient documentation

## 2018-03-05 DIAGNOSIS — Z85828 Personal history of other malignant neoplasm of skin: Secondary | ICD-10-CM | POA: Insufficient documentation

## 2018-03-05 DIAGNOSIS — Z7901 Long term (current) use of anticoagulants: Secondary | ICD-10-CM | POA: Insufficient documentation

## 2018-03-05 NOTE — ED Provider Notes (Signed)
[facility] EMERGENCY DEPARTMENT Provider Note   CSN: [id number] Arrival date & time: 03/05/18  7371     History   Chief Complaint Chief Complaint  Patient presents with  . Wrist Pain    HPI Peggy Mendez is a 84 y.o. female.  84 y/o female with a PMH of HTN, HLD, Sick sinus syndrome present to the ED s/p mechanical fall 4 days. Patient report she was walking on the driveway when she tripped and fell landing on the left side of her body. She reports hitting her head and face on the driveway. Patient reports she was evaluated by a family friend physician but was instructed to have xrays done on Monday on her left wrist. She reports her left wrist feels achy and sore but there is no pain to it.  She reports the pain or achiness is worse with movement.  She not tried any medical therapy for symptomatic relief.  Patient reports she did her head but denies any headache, nausea, vomiting I would not like CT imaging at this time.  Denies any other complaints chest dizziness, lightheaded, shortness of breath.     Past Medical History:  Diagnosis Date  . Cataract    REMOVED  . History of colonic polyps   . Hyperlipidemia   . Hypertension   . Osteopenia   . Paroxysmal atrial fibrillation (HCC)   . Sick sinus syndrome ([place])   . Skin cancer 2010   Left leg, SCC, sees derm  . Syncope 11/2005    Patient Active Problem List   Diagnosis Date Noted  . Degenerative arthritis of knee, bilateral 03/17/2016  . PCP NOTES >>>> 01/24/2015  . Angiodysplasia of colon 11/27/2012  . Personal history of colonic adenomas 11/12/2012  . Rosacea 08/24/2011  . AV block, 2nd degree 01/21/2011  . Annual physical exam >>>>>>>>>>>>>>>>>>> 07/30/2010  . PAROXYSMAL ATRIAL FIBRILLATION 04/22/2010  . Hypercholesterolemia 10/03/2006  . Essential hypertension 10/03/2006  . OSTEOPENIA 10/03/2006  . ABNORMAL MAMMOGRAM 10/03/2006  . BREAST BIOPSY, HX OF 10/03/2006  . PACEMAKER-Medtronic 06/03/2006     Past Surgical History:  Procedure Laterality Date  . APPENDECTOMY    . BREAST BIOPSY Bilateral    several, 11/2014 showed fibrocystic changes, no evidence of malignancy  . BREAST BIOPSY Right 11-2015  . cataracts Bilateral   . COLONOSCOPY W/ BIOPSIES    . EP IMPLANTABLE DEVICE N/A 11/27/2015   MDT Adapta L gen change by Dr Rayann Heman  . HEMORRHOID SURGERY    . PPM  2008   MDT dual chamber PPM implanted by Dr Verlon Setting for sick sinus and intermittent CHB  . TOTAL ABDOMINAL HYSTERECTOMY     oophorectomy  . US ECHOCARDIOGRAPHY  12/08/2005   EF 55-60%     OB History    Gravida  0   Para      Term      Preterm      AB      Living        SAB      TAB      Ectopic      Multiple      Live Births               Home Medications    Prior to Admission medications   Medication Sig Start Date End Date Taking? Authorizing Provider  Calcium Carbonate-Vitamin Peggy (CALCIUM 600/VITAMIN Peggy PO) Take 1 capsule by mouth daily.    [provider]  cholecalciferol (VITAMIN Peggy) 1000  units tablet Take 2,000 Units by mouth daily.    [provider]  Diclofenac Sodium 1.5 % SOLN Place 1 drop onto the skin as directed.    [provider]  ELIQUIS 2.5 MG TABS tablet TAKE 1 TABLET BY MOUTH TWO  TIMES DAILY 12/05/17   [place], Peter M, MD  ezetimibe (ZETIA) 10 MG tablet TAKE 1 TABLET BY MOUTH  DAILY 12/05/17   [place], Peter M, MD  losartan (COZAAR) 50 MG tablet TAKE 1 TABLET BY MOUTH  EVERY DAY 06/26/17   [place], Peter M, MD  Misc Natural Products [facility] ADVANCED) CAPS Take 1 capsule by mouth daily.    [provider]  multivitamin [facility]) per tablet Take 1 tablet by mouth daily.      [provider]  Omega-3 Fatty Acids (FISH OIL) 1200 MG CAPS Take one tablet by mouth daily    [provider]    Family History Family History  Problem Relation Age of Onset  . Breast cancer Sister   . Hypertension Sister   . Heart failure Mother   .  Heart disease Father   . Coronary artery disease Other        F, brothers x 2other family members  . Prostate cancer Brother   . Colon cancer Neg Hx   . Diabetes Neg Hx     Social History Social History   Tobacco Use  . Smoking status: Former Smoker    Types: Cigarettes    Last attempt to quit: 08/23/1988    Years since quitting: 29.5  . Smokeless tobacco: Never Used  . Tobacco comment: >20 yrs ago, used to smoke ~ 1 ppd  Substance Use Topics  . Alcohol use: Yes    Comment: socially  . Drug use: No     Allergies   Anesthetics, ester; Lisinopril; and Other   Review of Systems Review of Systems  Constitutional: Negative for fever.  Musculoskeletal: Positive for arthralgias.  Skin: Positive for color change.     Physical Exam Updated Vital Signs BP 132/87   Pulse 83   Temp 97.9 F (36.6 C) (Oral)   Resp 18   Ht 5\' 5"  (1.651 m)   Wt 55.3 kg   SpO2 97%   BMI 20.30 kg/m   Physical Exam  Constitutional: She appears well-developed and well-nourished.  HENT:  Head: Normocephalic.    Significant ecchymosis with yellow and purple coloring to the left side of her face.  Musculoskeletal:       Left wrist: She exhibits normal range of motion, no tenderness, no bony tenderness, no swelling and no deformity.  Pulses present, capillary refill intact.  Full range of motion without pain.  Nursing note and vitals reviewed.    ED Treatments / Results  Labs (all labs ordered are listed, but only abnormal results are displayed) Labs Reviewed - No data to display  EKG None  Radiology Dg Wrist Complete Left  Result Date: 03/05/2018 CLINICAL DATA:  Fall 03/01/2018 with persistent pain and swelling anterior left wrist. EXAM: LEFT WRIST - COMPLETE 3+ VIEW COMPARISON:  None. FINDINGS: Degenerative change over the radiocarpal joint and distal radioulnar joint. Moderate degenerative change over the scaphoid trapezium joint and first carpometacarpal joints. No acute fracture  or dislocation. IMPRESSION: No acute findings. Moderate degenerative changes over the wrist. Electronically Signed   By: Marin Olp M.Peggy.   On: 03/05/2018 10:19    Procedures Procedures (including critical care time)  Medications Ordered in ED Medications -  No data to display   Initial Impression / Assessment and Plan / ED Course  I have reviewed the triage vital signs and the nursing notes.  Pertinent labs & imaging results that were available during my care of the patient were reviewed by me and considered in my medical decision making (see chart for details).     She presents with left wrist pain which happened after a fall on Thanksgiving day.  She was monitored by a family friend physician but was advised to return to the ED on Monday for x-rays as sometimes fractures can be delayed.  Also hit her head on the driveway and has significant ecchymosis to the left side of her face but reports no nausea, vomiting, headache.  She was offered a CT head during my evaluation as patient is currently on Eliquis but patient refused.  She reports he does not have a headache and this is not needed at this time as she was watched by her family friends over the weekend.  The left wrist showed no acute abnormality, fracture, dislocation.  In a brace and states that there is no pain to it except for soreness and achiness.  She has full range of motion of her left wrist.  At this time I will discharge patient with return precautions to follow-up with primary care physician as needed in 1 week, she may continue to wear her left face which is currently in place since as she reports this is helping with the pains.  Vitals stable during ED visit, patient stable for discharge.  Final Clinical Impressions(s) / ED Diagnoses   Final diagnoses:  Left wrist pain  Fall, subsequent encounter    ED Discharge Orders    None       Janeece Fitting, PA-C 03/05/18 Ebony, Adam, DO 03/05/18 1528

## 2018-03-05 NOTE — ED Triage Notes (Signed)
Reports fall last Thursday with injury to left wrist.

## 2018-03-05 NOTE — Discharge Instructions (Addendum)
The left wrist xray today was negative for any fracture or dislocation.You may take ibuprofen or tylenol for your pain, please follow up with your primary care physician in one week for reevaluation of symptoms.

## 2018-04-06 LAB — CUP PACEART REMOTE DEVICE CHECK
Battery Impedance: 112 Ohm
Battery Voltage: 2.79 V
Brady Statistic AP VP Percent: 0 %
Brady Statistic AP VS Percent: 5 %
Brady Statistic AS VP Percent: 0 %
Brady Statistic AS VS Percent: 95 %
Date Time Interrogation Session: 20191111144940
Implantable Lead Implant Date: 20080318
Implantable Lead Implant Date: 20080318
Implantable Lead Location: 753859
Implantable Lead Location: 753860
Implantable Lead Model: 5076
Implantable Pulse Generator Implant Date: 20170825
Lead Channel Impedance Value: 466 Ohm
Lead Channel Impedance Value: 575 Ohm
Lead Channel Pacing Threshold Amplitude: 1 V
Lead Channel Pacing Threshold Pulse Width: 0.4 ms
Lead Channel Pacing Threshold Pulse Width: 0.4 ms
Lead Channel Setting Pacing Amplitude: 2.5 V
Lead Channel Setting Pacing Pulse Width: 0.4 ms
Lead Channel Setting Sensing Sensitivity: 5.6 mV
MDC IDC MSMT BATTERY REMAINING LONGEVITY: 157 mo
MDC IDC MSMT LEADCHNL RA PACING THRESHOLD AMPLITUDE: 0.5 V
MDC IDC SET LEADCHNL RA PACING AMPLITUDE: 2 V

## 2018-04-27 ENCOUNTER — Other Ambulatory Visit: Payer: Self-pay | Admitting: Cardiology

## 2018-04-30 ENCOUNTER — Other Ambulatory Visit: Payer: Self-pay | Admitting: Internal Medicine

## 2018-04-30 DIAGNOSIS — Z1231 Encounter for screening mammogram for malignant neoplasm of breast: Secondary | ICD-10-CM

## 2018-05-14 ENCOUNTER — Ambulatory Visit (INDEPENDENT_AMBULATORY_CARE_PROVIDER_SITE_OTHER): Payer: Medicare Other

## 2018-05-14 DIAGNOSIS — I441 Atrioventricular block, second degree: Secondary | ICD-10-CM

## 2018-05-14 DIAGNOSIS — I495 Sick sinus syndrome: Secondary | ICD-10-CM

## 2018-05-15 LAB — CUP PACEART REMOTE DEVICE CHECK
Battery Impedance: 135 Ohm
Battery Remaining Longevity: 150 mo
Battery Voltage: 2.79 V
Brady Statistic AP VP Percent: 0 %
Brady Statistic AP VS Percent: 5 %
Brady Statistic AS VP Percent: 0 %
Brady Statistic AS VS Percent: 95 %
Date Time Interrogation Session: 20200210134816
Implantable Lead Implant Date: 20080318
Implantable Lead Implant Date: 20080318
Implantable Lead Location: 753859
Implantable Lead Location: 753860
Implantable Lead Model: 5076
Implantable Pulse Generator Implant Date: 20170825
Lead Channel Impedance Value: 473 Ohm
Lead Channel Impedance Value: 605 Ohm
Lead Channel Pacing Threshold Amplitude: 0.5 V
Lead Channel Pacing Threshold Amplitude: 0.875 V
Lead Channel Pacing Threshold Pulse Width: 0.4 ms
Lead Channel Pacing Threshold Pulse Width: 0.4 ms
Lead Channel Setting Pacing Amplitude: 2 V
Lead Channel Setting Pacing Pulse Width: 0.4 ms
Lead Channel Setting Sensing Sensitivity: 5.6 mV
MDC IDC SET LEADCHNL RV PACING AMPLITUDE: 2.5 V

## 2018-05-25 NOTE — Progress Notes (Signed)
Remote pacemaker transmission.   

## 2018-05-28 NOTE — Progress Notes (Signed)
Peggy Mendez Date of Birth: Sep 25, 1926 Medical Record #[id number]  History of Present Illness: Peggy Mendez is seen for followup of Pacemaker and atrial fibrillation. She has a history of symptomatic AV block and is status post pacemaker implant. On pacemaker followup she was found to have paroxysmal atrial fibrillation.  No dizziness or syncope. She is on anticoagulation with Eliquis. Dose reduced for age and renal function. Her last pacemaker evaluation November 2018 showed some Afib but burden of 0.4%.  No sustained ventricular high rates. She had generator change out of her pacemaker in August 2017.   She is doing very well from a cardiac standpoint. Is completely unaware of AFib. Pacer check in February 2020 showed Afib burden of 1.2%. This has varied but is generally  Less than 10%. She denies any significant chest pain, palpitations or dyspnea. She tripped and fell in November hitting her face. Was evaluated without significant problems. Had a URI about a month ago but this has resolved.   Current Outpatient Medications on File Prior to Visit  Medication Sig Dispense Refill  . Calcium Carbonate-Vitamin Peggy (CALCIUM 600/VITAMIN Peggy PO) Take 1 capsule by mouth daily.    . cholecalciferol (VITAMIN Peggy) 1000 units tablet Take 2,000 Units by mouth daily.    . Diclofenac Sodium 1.5 % SOLN Place 1 drop onto the skin as directed.    Marland Kitchen ELIQUIS 2.5 MG TABS tablet TAKE 1 TABLET BY MOUTH TWO  TIMES DAILY 180 tablet 1  . ezetimibe (ZETIA) 10 MG tablet TAKE 1 TABLET BY MOUTH  DAILY 90 tablet 1  . losartan (COZAAR) 50 MG tablet TAKE 1 TABLET BY MOUTH  EVERY DAY 90 tablet 3  . Misc Natural Products (TART CHERRY ADVANCED) CAPS Take 1 capsule by mouth daily.    . multivitamin (THERAGRAN) per tablet Take 1 tablet by mouth daily.      . Omega-3 Fatty Acids (FISH OIL) 1200 MG CAPS Take one tablet by mouth daily     No current facility-administered medications on file prior to visit.     Allergies  Allergen  Reactions  . Anesthetics, Ester     Anesthetics not particularly Ester - nausea  . Lisinopril Cough  . Other Dermatitis and Rash    Tegaderm (clear occlusive dressing) Pt was miserable    Past Medical History:  Diagnosis Date  . Cataract    REMOVED  . History of colonic polyps   . Hyperlipidemia   . Hypertension   . Osteopenia   . Paroxysmal atrial fibrillation (HCC)   . Sick sinus syndrome ([place])   . Skin cancer 2010   Left leg, SCC, sees derm  . Syncope 11/2005    Past Surgical History:  Procedure Laterality Date  . APPENDECTOMY    . BREAST BIOPSY Bilateral    several, 11/2014 showed fibrocystic changes, no evidence of malignancy  . BREAST BIOPSY Right 11-2015  . cataracts Bilateral   . COLONOSCOPY W/ BIOPSIES    . EP IMPLANTABLE DEVICE N/A 11/27/2015   MDT Adapta L gen change by Dr Rayann Heman  . HEMORRHOID SURGERY    . PPM  2008   MDT dual chamber PPM implanted by Dr Verlon Setting for sick sinus and intermittent CHB  . TOTAL ABDOMINAL HYSTERECTOMY     oophorectomy  . US ECHOCARDIOGRAPHY  12/08/2005   EF 55-60%    Social History   Tobacco Use  Smoking Status Former Smoker  . Types: Cigarettes  . Last attempt to quit: 08/23/1988  .  Years since quitting: 29.7  Smokeless Tobacco Never Used  Tobacco Comment   >20 yrs ago, used to smoke ~ 1 ppd    Social History   Substance and Sexual Activity  Alcohol Use Yes   Comment: socially    Family History  Problem Relation Age of Onset  . Breast cancer Sister   . Hypertension Sister   . Heart failure Mother   . Heart disease Father   . Coronary artery disease Other        F, brothers x 2other family members  . Prostate cancer Brother   . Colon cancer Neg Hx   . Diabetes Neg Hx     Review of Systems: As noted in history of present illness.  All other systems were reviewed and are negative.  Physical Exam: BP 132/78   Pulse 76   Ht 5\' 5"  (1.651 m)   Wt 126 lb (57.2 kg)   SpO2 98%   BMI 20.97 kg/m  GENERAL:  Well  appearing WF in NAD HEENT:  PERRL, EOMI, sclera are clear. Oropharynx is clear. NECK:  No jugular venous distention, carotid upstroke brisk and symmetric, no bruits, no thyromegaly or adenopathy LUNGS:  Clear to auscultation bilaterally CHEST:  Unremarkable HEART:  RRR,  PMI not displaced or sustained,S1 and S2 within normal limits, no S3, no S4: no clicks, no rubs, no murmurs ABD:  Soft, nontender. BS +, no masses or bruits. No hepatomegaly, no splenomegaly EXT:  2 + pulses throughout, no edema, no cyanosis no clubbing SKIN:  Warm and dry.  No rashes NEURO:  Alert and oriented x 3. Cranial nerves II through XII intact. PSYCH:  Cognitively intact      LABORATORY DATA: Lab Results  Component Value Date   WBC 6.9 12/01/2017   HGB 13.4 12/01/2017   HCT 40.3 12/01/2017   PLT 236 12/01/2017   GLUCOSE 101 (H) 12/01/2017   CHOL 220 (H) 12/01/2017   TRIG 54 12/01/2017   HDL 108 12/01/2017   LDLDIRECT 105.8 09/07/2012   LDLCALC 101 (H) 12/01/2017   ALT 18 12/01/2017   AST 25 12/01/2017   NA 141 12/01/2017   K 4.5 12/01/2017   CL 103 12/01/2017   CREATININE 0.71 12/01/2017   BUN 11 12/01/2017   CO2 23 12/01/2017   TSH 2.920 12/01/2017   INR 1.0 11/23/2015     Assessment / Plan: 1. Paroxysmal atrial fibrillation. Patient is asymptomatic.  Low burden. Continue on Apixaban 2.5 mg twice a day. Labs last August were normal.   2. Mobitz type 2 second degree AV block status post pacemaker implant. Pacer checks are satisfactory.   3. Hypertension, controlled.   4. Hyperlipidemia. Intolerant to statins. On Zetia. LDL Ok.   Follow up in 6 months.

## 2018-05-31 ENCOUNTER — Encounter: Payer: Self-pay | Admitting: Cardiology

## 2018-05-31 ENCOUNTER — Ambulatory Visit: Payer: Medicare Other | Admitting: Cardiology

## 2018-05-31 ENCOUNTER — Encounter (INDEPENDENT_AMBULATORY_CARE_PROVIDER_SITE_OTHER): Payer: Self-pay

## 2018-05-31 VITALS — BP 132/78 | HR 76 | Ht 65.0 in | Wt 126.0 lb

## 2018-05-31 DIAGNOSIS — I480 Paroxysmal atrial fibrillation: Secondary | ICD-10-CM

## 2018-05-31 DIAGNOSIS — I441 Atrioventricular block, second degree: Secondary | ICD-10-CM

## 2018-05-31 DIAGNOSIS — I495 Sick sinus syndrome: Secondary | ICD-10-CM | POA: Diagnosis not present

## 2018-06-04 ENCOUNTER — Ambulatory Visit
Admission: RE | Admit: 2018-06-04 | Discharge: 2018-06-04 | Disposition: A | Discharge status: Home / Self Care | Payer: Medicare Other | Source: Ambulatory Visit | Attending: Internal Medicine | Admitting: Internal Medicine

## 2018-06-04 DIAGNOSIS — Z1231 Encounter for screening mammogram for malignant neoplasm of breast: Secondary | ICD-10-CM

## 2018-07-07 ENCOUNTER — Other Ambulatory Visit: Payer: Self-pay | Admitting: Cardiology

## 2018-07-07 DIAGNOSIS — I480 Paroxysmal atrial fibrillation: Secondary | ICD-10-CM

## 2018-07-09 NOTE — Telephone Encounter (Signed)
Eliquis and zetia refilled.

## 2018-08-13 ENCOUNTER — Other Ambulatory Visit: Payer: Self-pay

## 2018-08-13 ENCOUNTER — Ambulatory Visit (INDEPENDENT_AMBULATORY_CARE_PROVIDER_SITE_OTHER): Payer: Medicare Other | Admitting: *Deleted

## 2018-08-13 DIAGNOSIS — I495 Sick sinus syndrome: Secondary | ICD-10-CM | POA: Diagnosis not present

## 2018-08-13 DIAGNOSIS — I441 Atrioventricular block, second degree: Secondary | ICD-10-CM

## 2018-08-14 LAB — CUP PACEART REMOTE DEVICE CHECK
Battery Impedance: 160 Ohm
Battery Remaining Longevity: 143 mo
Battery Voltage: 2.79 V
Brady Statistic AP VP Percent: 0 %
Brady Statistic AP VS Percent: 5 %
Brady Statistic AS VP Percent: 0 %
Brady Statistic AS VS Percent: 95 %
Date Time Interrogation Session: 20200511130530
Implantable Lead Implant Date: 20080318
Implantable Lead Implant Date: 20080318
Implantable Lead Location: 753859
Implantable Lead Location: 753860
Implantable Lead Model: 5076
Implantable Lead Model: 5076
Implantable Pulse Generator Implant Date: 20170825
Lead Channel Impedance Value: 466 Ohm
Lead Channel Impedance Value: 506 Ohm
Lead Channel Pacing Threshold Amplitude: 0.5 V
Lead Channel Pacing Threshold Amplitude: 0.875 V
Lead Channel Pacing Threshold Pulse Width: 0.4 ms
Lead Channel Pacing Threshold Pulse Width: 0.4 ms
Lead Channel Setting Pacing Amplitude: 2 V
Lead Channel Setting Pacing Amplitude: 2.5 V
Lead Channel Setting Pacing Pulse Width: 0.4 ms
Lead Channel Setting Sensing Sensitivity: 5.6 mV

## 2018-08-22 NOTE — Progress Notes (Signed)
Remote pacemaker transmission.   

## 2018-10-31 ENCOUNTER — Encounter: Payer: Self-pay | Admitting: Internal Medicine

## 2018-11-12 ENCOUNTER — Ambulatory Visit (INDEPENDENT_AMBULATORY_CARE_PROVIDER_SITE_OTHER): Payer: Medicare Other | Admitting: *Deleted

## 2018-11-12 DIAGNOSIS — I441 Atrioventricular block, second degree: Secondary | ICD-10-CM

## 2018-11-12 DIAGNOSIS — I480 Paroxysmal atrial fibrillation: Secondary | ICD-10-CM

## 2018-11-12 LAB — CUP PACEART REMOTE DEVICE CHECK
Battery Impedance: 160 Ohm
Battery Remaining Longevity: 143 mo
Battery Voltage: 2.79 V
Brady Statistic AP VP Percent: 0 %
Brady Statistic AP VS Percent: 5 %
Brady Statistic AS VP Percent: 0 %
Brady Statistic AS VS Percent: 94 %
Date Time Interrogation Session: 20200810123006
Implantable Lead Implant Date: 20080318
Implantable Lead Implant Date: 20080318
Implantable Lead Location: 753859
Implantable Lead Location: 753860
Implantable Lead Model: 5076
Implantable Lead Model: 5076
Implantable Pulse Generator Implant Date: 20170825
Lead Channel Impedance Value: 466 Ohm
Lead Channel Impedance Value: 517 Ohm
Lead Channel Pacing Threshold Amplitude: 0.5 V
Lead Channel Pacing Threshold Amplitude: 1.125 V
Lead Channel Pacing Threshold Pulse Width: 0.4 ms
Lead Channel Pacing Threshold Pulse Width: 0.4 ms
Lead Channel Setting Pacing Amplitude: 2 V
Lead Channel Setting Pacing Amplitude: 2.5 V
Lead Channel Setting Pacing Pulse Width: 0.4 ms
Lead Channel Setting Sensing Sensitivity: 5.6 mV

## 2018-11-17 ENCOUNTER — Emergency Department (HOSPITAL_BASED_OUTPATIENT_CLINIC_OR_DEPARTMENT_OTHER): Payer: Medicare Other

## 2018-11-17 ENCOUNTER — Encounter (HOSPITAL_BASED_OUTPATIENT_CLINIC_OR_DEPARTMENT_OTHER): Payer: Self-pay | Admitting: *Deleted

## 2018-11-17 ENCOUNTER — Other Ambulatory Visit: Payer: Self-pay

## 2018-11-17 ENCOUNTER — Emergency Department (HOSPITAL_BASED_OUTPATIENT_CLINIC_OR_DEPARTMENT_OTHER)
Admission: EM | Admit: 2018-11-17 | Discharge: 2018-11-18 | Disposition: A | Discharge status: Home / Self Care | Payer: Medicare Other | Attending: Emergency Medicine | Admitting: Emergency Medicine

## 2018-11-17 DIAGNOSIS — S7002XA Contusion of left hip, initial encounter: Secondary | ICD-10-CM | POA: Insufficient documentation

## 2018-11-17 DIAGNOSIS — Z23 Encounter for immunization: Secondary | ICD-10-CM | POA: Diagnosis not present

## 2018-11-17 DIAGNOSIS — I480 Paroxysmal atrial fibrillation: Secondary | ICD-10-CM | POA: Diagnosis not present

## 2018-11-17 DIAGNOSIS — I10 Essential (primary) hypertension: Secondary | ICD-10-CM | POA: Diagnosis not present

## 2018-11-17 DIAGNOSIS — Z7901 Long term (current) use of anticoagulants: Secondary | ICD-10-CM | POA: Insufficient documentation

## 2018-11-17 DIAGNOSIS — Z87891 Personal history of nicotine dependence: Secondary | ICD-10-CM | POA: Diagnosis not present

## 2018-11-17 DIAGNOSIS — Y9389 Activity, other specified: Secondary | ICD-10-CM | POA: Insufficient documentation

## 2018-11-17 DIAGNOSIS — E785 Hyperlipidemia, unspecified: Secondary | ICD-10-CM | POA: Diagnosis not present

## 2018-11-17 DIAGNOSIS — S79919A Unspecified injury of unspecified hip, initial encounter: Secondary | ICD-10-CM

## 2018-11-17 DIAGNOSIS — I495 Sick sinus syndrome: Secondary | ICD-10-CM | POA: Insufficient documentation

## 2018-11-17 DIAGNOSIS — W010XXA Fall on same level from slipping, tripping and stumbling without subsequent striking against object, initial encounter: Secondary | ICD-10-CM | POA: Insufficient documentation

## 2018-11-17 DIAGNOSIS — S51012A Laceration without foreign body of left elbow, initial encounter: Secondary | ICD-10-CM

## 2018-11-17 DIAGNOSIS — S79912A Unspecified injury of left hip, initial encounter: Secondary | ICD-10-CM | POA: Diagnosis not present

## 2018-11-17 DIAGNOSIS — W19XXXA Unspecified fall, initial encounter: Secondary | ICD-10-CM

## 2018-11-17 DIAGNOSIS — S50312A Abrasion of left elbow, initial encounter: Secondary | ICD-10-CM | POA: Diagnosis present

## 2018-11-17 DIAGNOSIS — Y999 Unspecified external cause status: Secondary | ICD-10-CM | POA: Diagnosis not present

## 2018-11-17 DIAGNOSIS — Y92129 Unspecified place in nursing home as the place of occurrence of the external cause: Secondary | ICD-10-CM | POA: Insufficient documentation

## 2018-11-17 MED ORDER — TETANUS-DIPHTH-ACELL PERTUSSIS 5-2.5-18.5 LF-MCG/0.5 IM SUSP
0.5000 mL | Freq: Once | INTRAMUSCULAR | Status: AC
  Administered 2018-11-17: 0.5 mL via INTRAMUSCULAR
  Filled 2018-11-17: qty 0.5

## 2018-11-17 NOTE — ED Triage Notes (Addendum)
Pt states that she turned and fell when trying to close the door.  States she did not hit her head and no loc. Pt has a skin tear to her left elbow and states her left hip is "sore" pt ambulates with a cane and is able to bear weight. States her left foot may be "sprained" no obvious deformity noted. Pt is alert and oriented times 3. States she drove herself to the ED.

## 2018-11-17 NOTE — ED Provider Notes (Signed)
[place] DEPT [facility] Provider Note: Georgena Spurling, MD, FACEP  CSN: [id number] MRN: [id number] ARRIVAL: 11/17/18 at 2148 ROOM: [place]  Fall   HISTORY OF PRESENT ILLNESS  11/17/18 10:58 PM Peggy Mendez is a 85 y.o. female who was trying to close a door about 9 PM and lost her balance and fell.  She did not hit her head and there was no loss of consciousness.  Her primary complaint is a skin tear to her left elbow.  She denies significant pain at the elbow and her wound was bandaged by nursing staff prior to my evaluation.  She is also complaining of pain in her left greater trochanter.  Pain is mild and she is still able to ambulate.  It is worse with palpation or movement.  She also struck her left ankle but states there is no pain or swelling there.  She is not sure of her tetanus status.   Past Medical History:  Diagnosis Date  . Cataract    REMOVED  . History of colonic polyps   . Hyperlipidemia   . Hypertension   . Osteopenia   . Paroxysmal atrial fibrillation (HCC)   . Sick sinus syndrome ([place])   . Skin cancer 2010   Left leg, SCC, sees derm  . Syncope 11/2005    Past Surgical History:  Procedure Laterality Date  . APPENDECTOMY    . BREAST BIOPSY Bilateral    several, 11/2014 showed fibrocystic changes, no evidence of malignancy  . BREAST BIOPSY Right 11-2015  . cataracts Bilateral   . COLONOSCOPY W/ BIOPSIES    . EP IMPLANTABLE DEVICE N/A 11/27/2015   MDT Adapta L gen change by Dr Rayann Heman  . HEMORRHOID SURGERY    . PPM  2008   MDT dual chamber PPM implanted by Dr Verlon Setting for sick sinus and intermittent CHB  . TOTAL ABDOMINAL HYSTERECTOMY     oophorectomy  . US ECHOCARDIOGRAPHY  12/08/2005   EF 55-60%    Family History  Problem Relation Age of Onset  . Breast cancer Sister   . Hypertension Sister   . Heart failure Mother   . Heart disease Father   . Coronary artery disease Other        F, brothers x 2other family members  .  Prostate cancer Brother   . Colon cancer Neg Hx   . Diabetes Neg Hx     Social History   Tobacco Use  . Smoking status: Former Smoker    Types: Cigarettes    Quit date: 08/23/1988    Years since quitting: 30.2  . Smokeless tobacco: Never Used  . Tobacco comment: >20 yrs ago, used to smoke ~ 1 ppd  Substance Use Topics  . Alcohol use: Yes    Comment: socially  . Drug use: No    Prior to Admission medications   Medication Sig Start Date End Date Taking? Authorizing Provider  Calcium Carbonate-Vitamin D (CALCIUM 600/VITAMIN D PO) Take 1 capsule by mouth daily.    [provider]  cholecalciferol (VITAMIN D) 1000 units tablet Take 2,000 Units by mouth daily.    [provider]  Diclofenac Sodium 1.5 % SOLN Place 1 drop onto the skin as directed.    [provider]  ELIQUIS 2.5 MG TABS tablet TAKE 1 TABLET BY MOUTH TWO  TIMES DAILY 07/09/18   Martinique, Peter M, MD  ezetimibe (ZETIA) 10 MG tablet TAKE 1 TABLET BY MOUTH  DAILY 07/09/18  Martinique, Peter M, MD  losartan (COZAAR) 50 MG tablet TAKE 1 TABLET BY MOUTH  EVERY DAY 04/27/18   Martinique, Peter M, MD  Misc Natural Products [facility] ADVANCED) CAPS Take 1 capsule by mouth daily.    [provider]  multivitamin [facility]) per tablet Take 1 tablet by mouth daily.      [provider]  Omega-3 Fatty Acids (FISH OIL) 1200 MG CAPS Take one tablet by mouth daily    [provider]    Allergies Anesthetics, ester; Lisinopril; and Other   REVIEW OF SYSTEMS  Negative except as noted here or in the History of Present Illness.   PHYSICAL EXAMINATION  Initial Vital Signs Blood pressure (!) 148/68, pulse 88, temperature 98.6 F (37 C), temperature source Oral, resp. rate 20, height 5\' 5"  (1.651 m), weight 56.2 kg, SpO2 97 %.  Examination General: Well-developed, well-nourished female in no acute distress; appearance consistent with age of record HENT: normocephalic; atraumatic Eyes:  pupils equal, round and reactive to light; extraocular muscles intact; bilateral pseudophakia Neck: supple; nontender Heart: regular rate and rhythm Lungs: clear to auscultation bilaterally Chest: Nontender Abdomen: soft; nondistended; nontender; bowel sounds present Back: No spinal tenderness Extremities: No acute deformity; normal range of motion; pulses normal; tenderness over left greater trochanter Neurologic: Awake, alert and oriented; motor function intact in all extremities and symmetric; no facial droop Skin: Warm and dry; bandaged skin tear left elbow:    Psychiatric: Normal mood and affect   RESULTS  Summary of this visit's results, reviewed by myself:   EKG Interpretation  Date/Time:    Ventricular Rate:    PR Interval:    QRS Duration:   QT Interval:    QTC Calculation:   R Axis:     Text Interpretation:        Laboratory Studies: No results found for this or any previous visit (from the past 24 hour(s)). Imaging Studies: Dg Hip Unilat With Pelvis 2-3 Views Left  Result Date: 11/17/2018 CLINICAL DATA:  85 year old female with fall and left hip pain. EXAM: DG HIP (WITH OR WITHOUT PELVIS) 2-3V LEFT COMPARISON:  None. FINDINGS: There is no acute fracture or dislocation. The bones are osteopenic. Moderate left and severe right hip osteoarthritic changes with joint space narrowing. Degenerative changes of the visualized lower lumbar spine. The soft tissues are unremarkable. IMPRESSION: No acute fracture or dislocation. Electronically Signed   By: Anner Crete M.D.   On: 11/17/2018 23:32    ED COURSE and MDM  Nursing notes and initial vitals signs, including pulse oximetry, reviewed.  Vitals:   11/17/18 2156 11/17/18 2159  BP: (!) 148/68   Pulse: 88   Resp: 20   Temp: 98.6 F (37 C)   TempSrc: Oral   SpO2: 97%   Weight:  56.2 kg  Height:  5\' 5"  (1.651 m)   No evidence of greater trochanter fracture.  I suspect this represents contusion of the  trochanteric bursa.  As noted above she is ambulatory and weightbearing without difficulty.  PROCEDURES    ED DIAGNOSES     ICD-10-CM   1. Fall in home, initial encounter  W19.XXXA    Y92.009            2. Skin tear of left elbow without complication, initial encounter  S51.012A   3. Contusion of left hip, initial encounter  S70.Horald Pollen, MD 11/17/18 [phone]

## 2018-11-20 NOTE — Progress Notes (Signed)
Remote pacemaker transmission.   

## 2018-11-21 ENCOUNTER — Telehealth: Payer: Self-pay | Admitting: Internal Medicine

## 2018-11-21 NOTE — Telephone Encounter (Signed)
Recently seen at the ER after a fall.  Please call and check on the patient, is she doing okay?  Does she need a follow-up?

## 2018-11-22 NOTE — Telephone Encounter (Signed)
Spoke w/ Pt- she is still sore but doing better everyday- she doesn't feel she needs a visit at this time but is scheduled for her annual visit in November- she will call us if she needs [place] sooner.

## 2018-11-22 NOTE — Telephone Encounter (Signed)
Thank you :)

## 2018-12-10 ENCOUNTER — Other Ambulatory Visit: Payer: Self-pay | Admitting: Cardiology

## 2018-12-10 DIAGNOSIS — I480 Paroxysmal atrial fibrillation: Secondary | ICD-10-CM

## 2018-12-12 NOTE — Telephone Encounter (Signed)
Refill request

## 2018-12-13 NOTE — Telephone Encounter (Signed)
Scr 0.71 75f 57.2kg Lovw/jordan 05/31/18

## 2019-01-22 NOTE — Progress Notes (Signed)
Virtual Visit via Telephone Note   This visit type was conducted due to national recommendations for restrictions regarding the COVID-19 Pandemic (e.g. social distancing) in an effort to limit this patient's exposure and mitigate transmission in our community.  Due to her co-morbid illnesses, this patient is at least at moderate risk for complications without adequate follow up.  This format is felt to be most appropriate for this patient at this time.  The patient did not have access to video technology/had technical difficulties with video requiring transitioning to audio format only (telephone).  All issues noted in this document were discussed and addressed.  No physical exam could be performed with this format.  Please refer to the patient's chart for her  consent to telehealth for [facility].   Date:  01/25/2019   ID:  Peggy Mendez [facility], DOB 1926-05-04, MRN [id number]  Patient Location: Home Provider Location: Home  PCP:  Colon Branch, MD  Cardiologist:   Martinique, MD  Electrophysiologist:  None   Evaluation Performed:  Follow-Up Visit  Chief Complaint:  AV block  History of Present Illness:    Peggy Mendez is a 85 y.o. female with a history of symptomatic AV block and is status post pacemaker implant. On pacemaker followup she was found to have paroxysmal atrial fibrillation.  No dizziness or syncope. She is on anticoagulation with Eliquis. Dose reduced for age and renal function. Her last pacemaker evaluation November 2018 showed some Afib but burden of 0.4%.  No sustained ventricular high rates. She had generator change out of her pacemaker in August 2017. Device check in August 2020 showed normal function with AF burden of 0.9%.   She does note more difficulty with balance. No dyspnea, chest pain, or syncope. Being Covid safe.   The patient does not have symptoms concerning for COVID-19 infection (fever, chills, cough, or new shortness of breath).    Past  Medical History:  Diagnosis Date  . Cataract    REMOVED  . History of colonic polyps   . Hyperlipidemia   . Hypertension   . Osteopenia   . Paroxysmal atrial fibrillation (HCC)   . Sick sinus syndrome ([place])   . Skin cancer 2010   Left leg, SCC, sees derm  . Syncope 11/2005   Past Surgical History:  Procedure Laterality Date  . APPENDECTOMY    . BREAST BIOPSY Bilateral    several, 11/2014 showed fibrocystic changes, no evidence of malignancy  . BREAST BIOPSY Right 11-2015  . cataracts Bilateral   . COLONOSCOPY W/ BIOPSIES    . EP IMPLANTABLE DEVICE N/A 11/27/2015   MDT Adapta L gen change by Dr Rayann Heman  . HEMORRHOID SURGERY    . PPM  2008   MDT dual chamber PPM implanted by Dr Verlon Setting for sick sinus and intermittent CHB  . TOTAL ABDOMINAL HYSTERECTOMY     oophorectomy  . US ECHOCARDIOGRAPHY  12/08/2005   EF 55-60%     Current Meds  Medication Sig  . cholecalciferol (VITAMIN D) 1000 units tablet Take 2,000 Units by mouth daily.  Marland Kitchen ELIQUIS 2.5 MG TABS tablet TAKE 1 TABLET BY MOUTH  TWICE DAILY  . ezetimibe (ZETIA) 10 MG tablet TAKE 1 TABLET BY MOUTH  DAILY  . losartan (COZAAR) 50 MG tablet TAKE 1 TABLET BY MOUTH  EVERY DAY  . Misc Natural Products (TART CHERRY ADVANCED) CAPS Take 1 capsule by mouth daily.  . multivitamin (THERAGRAN) per tablet Take 1 tablet by mouth daily.    Marland Kitchen  Omega-3 Fatty Acids (FISH OIL) 1200 MG CAPS Take one tablet by mouth daily     Allergies:   Anesthetics, ester; Lisinopril; and Other   Social History   Tobacco Use  . Smoking status: Former Smoker    Types: Cigarettes    Quit date: 08/23/1988    Years since quitting: 30.4  . Smokeless tobacco: Never Used  . Tobacco comment: >20 yrs ago, used to smoke ~ 1 ppd  Substance Use Topics  . Alcohol use: Yes    Comment: socially  . Drug use: No     Family Hx: The patient's family history includes Breast cancer in her sister; Coronary artery disease in an other family member; Heart disease in her  father; Heart failure in her mother; Hypertension in her sister; Prostate cancer in her brother. There is no history of Colon cancer or Diabetes.  ROS:   Please see the history of present illness.    All other systems reviewed and are negative.   Prior CV studies:   The following studies were reviewed today:  none  Labs/Other Tests and Data Reviewed:    EKG:  No ECG reviewed.  Recent Labs: No results found for requested labs within last 8760 hours.   Recent Lipid Panel Lab Results  Component Value Date/Time   CHOL 220 (H) 12/01/2017 11:00 AM   TRIG 54 12/01/2017 11:00 AM   TRIG 62 06/17/2009   HDL 108 12/01/2017 11:00 AM   CHOLHDL 3 02/10/2017 08:34 AM   LDLCALC 101 (H) 12/01/2017 11:00 AM   LDLDIRECT 105.8 09/07/2012 08:43 AM    Wt Readings from Last 3 Encounters:  01/25/19 124 lb (56.2 kg)  11/17/18 124 lb (56.2 kg)  05/31/18 126 lb (57.2 kg)     Objective:    Vital Signs:  BP 122/73   Pulse 68   Ht 5\' 5"  (1.651 m)   Wt 124 lb (56.2 kg)   BMI 20.63 kg/m    VITAL SIGNS:  reviewed  ASSESSMENT & PLAN:    1. Paroxysmal atrial fibrillation. Patient is asymptomatic.  Low burden. Continue on Apixaban 2.5 mg twice a day. Labs last August were normal.   2. Mobitz type 2 second degree AV block status post pacemaker implant. Pacer checks are satisfactory.   3. Hypertension, controlled.   4. Hyperlipidemia. Intolerant to statins. On Zetia. LDL Ok.   COVID-19 Education: The signs and symptoms of COVID-19 were discussed with the patient and how to seek care for testing (follow up with PCP or arrange E-visit).  The importance of social distancing was discussed today.  Time:   Today, I have spent 10 minutes with the patient with telehealth technology discussing the above problems.     Medication Adjustments/Labs and Tests Ordered: Current medicines are reviewed at length with the patient today.  Concerns regarding medicines are outlined above.   Tests Ordered:  No orders of the defined types were placed in this encounter.   Medication Changes: No orders of the defined types were placed in this encounter.   Follow Up:  In Person in 6 month(s)  Signed,  Martinique, MD  01/25/2019 9:12 AM    [facility]

## 2019-01-25 ENCOUNTER — Ambulatory Visit: Payer: Medicare Other | Admitting: Cardiology

## 2019-01-25 ENCOUNTER — Telehealth (INDEPENDENT_AMBULATORY_CARE_PROVIDER_SITE_OTHER): Payer: Medicare Other | Admitting: Cardiology

## 2019-01-25 ENCOUNTER — Encounter: Payer: Self-pay | Admitting: Cardiology

## 2019-01-25 VITALS — BP 122/73 | HR 68 | Ht 65.0 in | Wt 124.0 lb

## 2019-01-25 DIAGNOSIS — I441 Atrioventricular block, second degree: Secondary | ICD-10-CM

## 2019-01-25 DIAGNOSIS — I10 Essential (primary) hypertension: Secondary | ICD-10-CM

## 2019-01-25 DIAGNOSIS — I480 Paroxysmal atrial fibrillation: Secondary | ICD-10-CM

## 2019-01-25 DIAGNOSIS — E7800 Pure hypercholesterolemia, unspecified: Secondary | ICD-10-CM

## 2019-01-25 NOTE — Patient Instructions (Signed)
Medication Instructions:  Continue same medications *If you need a refill on your cardiac medications before your next appointment, please call your pharmacy*  Lab Work: None ordered   Testing/Procedures: None ordered  Follow-Up: At [facility], you and your health needs are our priority.  As part of our continuing mission to provide you with exceptional heart care, we have created designated Provider Care Teams.  These Care Teams include your primary Cardiologist (physician) and Advanced Practice Providers (APPs -  Physician Assistants and Nurse Practitioners) who all work together to provide you with the care you need, when you need it.  Your next appointment:  6 months     Call in January to schedule April appointment

## 2019-02-11 ENCOUNTER — Ambulatory Visit (INDEPENDENT_AMBULATORY_CARE_PROVIDER_SITE_OTHER): Payer: Medicare Other | Admitting: *Deleted

## 2019-02-11 DIAGNOSIS — I441 Atrioventricular block, second degree: Secondary | ICD-10-CM

## 2019-02-11 DIAGNOSIS — I480 Paroxysmal atrial fibrillation: Secondary | ICD-10-CM

## 2019-02-12 LAB — CUP PACEART REMOTE DEVICE CHECK
Battery Impedance: 184 Ohm
Battery Remaining Longevity: 138 mo
Battery Voltage: 2.79 V
Brady Statistic AP VP Percent: 0 %
Brady Statistic AP VS Percent: 5 %
Brady Statistic AS VP Percent: 0 %
Brady Statistic AS VS Percent: 95 %
Date Time Interrogation Session: 20201109144544
Implantable Lead Implant Date: 20080318
Implantable Lead Implant Date: 20080318
Implantable Lead Location: 753859
Implantable Lead Location: 753860
Implantable Lead Model: 5076
Implantable Lead Model: 5076
Implantable Pulse Generator Implant Date: 20170825
Lead Channel Impedance Value: 473 Ohm
Lead Channel Impedance Value: 506 Ohm
Lead Channel Pacing Threshold Amplitude: 0.5 V
Lead Channel Pacing Threshold Amplitude: 1 V
Lead Channel Pacing Threshold Pulse Width: 0.4 ms
Lead Channel Pacing Threshold Pulse Width: 0.4 ms
Lead Channel Setting Pacing Amplitude: 2 V
Lead Channel Setting Pacing Amplitude: 2.5 V
Lead Channel Setting Pacing Pulse Width: 0.4 ms
Lead Channel Setting Sensing Sensitivity: 5.6 mV

## 2019-02-15 ENCOUNTER — Telehealth: Payer: Medicare Other | Admitting: Internal Medicine

## 2019-02-22 ENCOUNTER — Encounter: Payer: Medicare Other | Admitting: Internal Medicine

## 2019-02-26 ENCOUNTER — Other Ambulatory Visit: Payer: Self-pay | Admitting: Cardiology

## 2019-02-26 DIAGNOSIS — I480 Paroxysmal atrial fibrillation: Secondary | ICD-10-CM

## 2019-02-26 NOTE — Telephone Encounter (Signed)
Rx request sent to pharmacy.  

## 2019-03-11 NOTE — Progress Notes (Signed)
Remote pacemaker transmission.   

## 2019-04-02 ENCOUNTER — Encounter: Payer: Self-pay | Admitting: Internal Medicine

## 2019-04-02 ENCOUNTER — Ambulatory Visit (INDEPENDENT_AMBULATORY_CARE_PROVIDER_SITE_OTHER): Payer: Medicare Other | Admitting: Internal Medicine

## 2019-04-02 ENCOUNTER — Other Ambulatory Visit: Payer: Self-pay

## 2019-04-02 VITALS — BP 115/54 | HR 71 | Temp 96.0°F | Resp 12 | Ht 65.0 in | Wt 123.6 lb

## 2019-04-02 DIAGNOSIS — Z0000 Encounter for general adult medical examination without abnormal findings: Secondary | ICD-10-CM | POA: Diagnosis not present

## 2019-04-02 LAB — LIPID PANEL
Cholesterol: 198 mg/dL (ref 0–200)
HDL: 76.8 mg/dL (ref >39.00)
LDL Cholesterol: 101 mg/dL — ABNORMAL HIGH (ref 0–99)
NonHDL: 120.79
Total CHOL/HDL Ratio: 3
Triglycerides: 98 mg/dL (ref 0.0–149.0)
VLDL: 19.6 mg/dL (ref 0.0–40.0)

## 2019-04-02 LAB — CBC WITH DIFFERENTIAL/PLATELET
Basophils Absolute: 0 10*3/uL (ref 0.0–0.1)
Basophils Relative: 0.6 % (ref 0.0–3.0)
Eosinophils Absolute: 0.1 10*3/uL (ref 0.0–0.7)
Eosinophils Relative: 1.8 % (ref 0.0–5.0)
HCT: 39.2 % (ref 36.0–46.0)
Hemoglobin: 13.2 g/dL (ref 12.0–15.0)
Lymphocytes Relative: 31.8 % (ref 12.0–46.0)
Lymphs Abs: 1.9 10*3/uL (ref 0.7–4.0)
MCHC: 33.7 g/dL (ref 30.0–36.0)
MCV: 85.1 fl (ref 78.0–100.0)
Monocytes Absolute: 0.7 10*3/uL (ref 0.1–1.0)
Monocytes Relative: 11.9 % (ref 3.0–12.0)
Neutro Abs: 3.3 10*3/uL (ref 1.4–7.7)
Neutrophils Relative %: 53.9 % (ref 43.0–77.0)
Platelets: 213 10*3/uL (ref 150.0–400.0)
RBC: 4.61 Mil/uL (ref 3.87–5.11)
RDW: 13.5 % (ref 11.5–15.5)
WBC: 6.1 10*3/uL (ref 4.0–10.5)

## 2019-04-02 LAB — COMPREHENSIVE METABOLIC PANEL
ALT: 19 U/L (ref 0–35)
AST: 24 U/L (ref 0–37)
Albumin: 4.1 g/dL (ref 3.5–5.2)
Alkaline Phosphatase: 83 U/L (ref 39–117)
BUN: 14 mg/dL (ref 6–23)
CO2: 26 mEq/L (ref 19–32)
Calcium: 9.4 mg/dL (ref 8.4–10.5)
Chloride: 103 mEq/L (ref 96–112)
Creatinine, Ser: 0.68 mg/dL (ref 0.40–1.20)
GFR: 81.18 mL/min (ref >60.00)
Glucose, Bld: 95 mg/dL (ref 70–99)
Potassium: 4.2 mEq/L (ref 3.5–5.1)
Sodium: 136 mEq/L (ref 135–145)
Total Bilirubin: 0.8 mg/dL (ref 0.2–1.2)
Total Protein: 6.6 g/dL (ref 6.0–8.3)

## 2019-04-02 NOTE — Patient Instructions (Signed)
GO TO THE LAB : Get the blood work     GO TO THE FRONT DESK Schedule your next appointment  For a physical exam in 1 year    Fall Prevention in the Home, Adult Falls can cause injuries and can affect people from all age groups. There are many simple things that you can do to make your home safe and to help prevent falls. Ask for help when making these changes, if needed. What actions can I take to prevent falls? General instructions  Use good lighting in all rooms. Replace any light bulbs that burn out.  Turn on lights if it is dark. Use night-lights.  Place frequently used items in easy-to-reach places. Lower the shelves around your home if necessary.  Set up furniture so that there are clear paths around it. Avoid moving your furniture around.  Remove throw rugs and other tripping hazards from the floor.  Avoid walking on wet floors.  Fix any uneven floor surfaces.  Add color or contrast paint or tape to grab bars and handrails in your home. Place contrasting color strips on the first and last steps of stairways.  When you use a stepladder, make sure that it is completely opened and that the sides are firmly locked. Have someone hold the ladder while you are using it. Do not climb a closed stepladder.  Be aware of any and all pets. What can I do in the bathroom?      Keep the floor dry. Immediately clean up any water that spills onto the floor.  Remove soap buildup in the tub or shower on a regular basis.  Use non-skid mats or decals on the floor of the tub or shower.  Attach bath mats securely with double-sided, non-slip rug tape.  If you need to sit down while you are in the shower, use a plastic, non-slip stool.  Install grab bars by the toilet and in the tub and shower. Do not use towel bars as grab bars. What can I do in the bedroom?  Make sure that a bedside light is easy to reach.  Do not use oversized bedding that drapes onto the floor.  Have a firm  chair that has side arms to use for getting dressed. What can I do in the kitchen?  Clean up any spills right away.  If you need to reach for something above you, use a sturdy step stool that has a grab bar.  Keep electrical cables out of the way.  Do not use floor polish or wax that makes floors slippery. If you must use wax, make sure that it is non-skid floor wax. What can I do in the stairways?  Do not leave any items on the stairs.  Make sure that you have a light switch at the top of the stairs and the bottom of the stairs. Have them installed if you do not have them.  Make sure that there are handrails on both sides of the stairs. Fix handrails that are broken or loose. Make sure that handrails are as long as the stairways.  Install non-slip stair treads on all stairs in your home.  Avoid having throw rugs at the top or bottom of stairways, or secure the rugs with carpet tape to prevent them from moving.  Choose a carpet design that does not hide the edge of steps on the stairway.  Check any carpeting to make sure that it is firmly attached to the stairs. Fix any carpet that  is loose or worn. What can I do on the outside of my home?  Use bright outdoor lighting.  Regularly repair the edges of walkways and driveways and fix any cracks.  Remove high doorway thresholds.  Trim any shrubbery on the main path into your home.  Regularly check that handrails are securely fastened and in good repair. Both sides of any steps should have handrails.  Install guardrails along the edges of any raised decks or porches.  Clear walkways of debris and clutter, including tools and rocks.  Have leaves, snow, and ice cleared regularly.  Use sand or salt on walkways during winter months.  In the garage, clean up any spills right away, including grease or oil spills. What other actions can I take?  Wear closed-toe shoes that fit well and support your feet. Wear shoes that have rubber  soles or low heels.  Use mobility aids as needed, such as canes, walkers, scooters, and crutches.  Review your medicines with your health care provider. Some medicines can cause dizziness or changes in blood pressure, which increase your risk of falling. Talk with your health care provider about other ways that you can decrease your risk of falls. This may include working with a physical therapist or trainer to improve your strength, balance, and endurance. Where to find more information  Centers for Disease Control and Prevention, STEADI: WebmailGuide.co.za  [employer] on Aging: BrainJudge.co.uk Contact a health care provider if:  You are afraid of falling at home.  You feel weak, drowsy, or dizzy at home.  You fall at home. Summary  There are many simple things that you can do to make your home safe and to help prevent falls.  Ways to make your home safe include removing tripping hazards and installing grab bars in the bathroom.  Ask for help when making these changes in your home. This information is not intended to replace advice given to you by your health care provider. Make sure you discuss any questions you have with your health care provider. Document Released: 03/11/2002 Document Revised: 03/03/2017 Document Reviewed: 11/03/2016 Elsevier Patient Education  2020 [employer].

## 2019-04-02 NOTE — Progress Notes (Signed)
Subjective:    Patient ID: Peggy Mendez, female    DOB: Jun 22, 1926, 85 y.o.   MRN: [id number]  DOS:  04/02/2019 Type of visit - description: CPX No major concerns Had 2 falls this year, 1 mechanical and the other 1 due to her history of benign positional vertigo: she turned her head really fast and lost her balance. Fortunately not major consequences.   Review of Systems  Other than above, a 14 point review of systems is negative   Past Medical History:  Diagnosis Date  . Cataract    REMOVED  . History of colonic polyps   . Hyperlipidemia   . Hypertension   . Osteopenia   . Paroxysmal atrial fibrillation (HCC)   . Sick sinus syndrome ([place])   . Skin cancer 2010   Left leg, SCC, sees derm  . Syncope 11/2005    Past Surgical History:  Procedure Laterality Date  . APPENDECTOMY    . BREAST BIOPSY Bilateral    several, 11/2014 showed fibrocystic changes, no evidence of malignancy  . BREAST BIOPSY Right 11-2015  . cataracts Bilateral   . COLONOSCOPY W/ BIOPSIES    . EP IMPLANTABLE DEVICE N/A 11/27/2015   MDT Adapta L gen change by Dr Rayann Heman  . HEMORRHOID SURGERY    . PPM  2008   MDT dual chamber PPM implanted by Dr Verlon Setting for sick sinus and intermittent CHB  . TOTAL ABDOMINAL HYSTERECTOMY     oophorectomy  . US ECHOCARDIOGRAPHY  12/08/2005   EF 55-60%    Social History   Socioeconomic History  . Marital status: Single    Spouse name: Not on file  . Number of children: 0  . Years of education: Not on file  . Highest education level: Not on file  Occupational History  . Occupation: retired     Fish farm manager: RETIRED  Tobacco Use  . Smoking status: Former Smoker    Types: Cigarettes    Quit date: 08/23/1988    Years since quitting: 30.6  . Smokeless tobacco: Never Used  . Tobacco comment: >20 yrs ago, used to smoke ~ 1 ppd  Substance and Sexual Activity  . Alcohol use: Yes    Comment: socially  . Drug use: No  . Sexual activity: Not on file  Other Topics  Concern  . Not on file  Social History Narrative   Lives by herself,  still drives.   Has a twin sister in [place], she has dementia,   a nephew in [place].   Social Determinants of Health   Financial Resource Strain:   . Difficulty of Paying Living Expenses: Not on file  Food Insecurity:   . Worried About Charity fundraiser in the Last Year: Not on file  . Ran Out of Food in the Last Year: Not on file  Transportation Needs:   . Lack of Transportation (Medical): Not on file  . Lack of Transportation (Non-Medical): Not on file  Physical Activity:   . Days of Exercise per Week: Not on file  . Minutes of Exercise per Session: Not on file  Stress:   . Feeling of Stress : Not on file  Social Connections:   . Frequency of Communication with Friends and Family: Not on file  . Frequency of Social Gatherings with Friends and Family: Not on file  . Attends Religious Services: Not on file  . Active Member of Clubs or Organizations: Not on file  . Attends Archivist Meetings: Not  on file  . Marital Status: Not on file  Intimate Partner Violence:   . Fear of Current or Ex-Partner: Not on file  . Emotionally Abused: Not on file  . Physically Abused: Not on file  . Sexually Abused: Not on file     Family History  Problem Relation Age of Onset  . Breast cancer Sister   . Hypertension Sister   . Heart failure Mother   . Heart disease Father   . Coronary artery disease Other        F, brothers x 2other family members  . Prostate cancer Brother   . Colon cancer Neg Hx   . Diabetes Neg Hx      Allergies as of 04/02/2019      Reactions   Anesthetics, Ester    Anesthetics not particularly Ester - nausea   Lisinopril Cough   Other Dermatitis, Rash   Tegaderm (clear occlusive dressing) Pt was miserable      Medication List       Accurate as of April 02, 2019 11:59 PM. If you have any questions, ask your nurse or doctor.        CALCIUM 600/VITAMIN D PO Take 1  capsule by mouth daily.   cholecalciferol 1000 units tablet Commonly known as: VITAMIN D Take 2,000 Units by mouth daily.   Eliquis 2.5 MG Tabs tablet Generic drug: apixaban TAKE 1 TABLET BY MOUTH  TWICE DAILY   ezetimibe 10 MG tablet Commonly known as: ZETIA TAKE 1 TABLET BY MOUTH  DAILY   Fish Oil 1200 MG Caps Take one tablet by mouth daily   losartan 50 MG tablet Commonly known as: COZAAR TAKE 1 TABLET BY MOUTH  EVERY DAY   multivitamin per tablet Take 1 tablet by mouth daily.   Tart Cherry Advanced Caps Take 1 capsule by mouth daily.           Objective:   Physical Exam BP (!) 115/54 (BP Location: Right Arm, Cuff Size: Normal)   Pulse 71   Temp (!) 96 F (35.6 C) (Temporal)   Resp 12   Ht 5\' 5"  (1.651 m)   Wt 123 lb 9.6 oz (56.1 kg)   SpO2 99%   BMI 20.57 kg/m  General: Well developed, NAD, BMI noted Neck: No  thyromegaly  HEENT:  Normocephalic . Face symmetric, atraumatic Lungs:  CTA B Normal respiratory effort, no intercostal retractions, no accessory muscle use. Heart: RRR,  no murmur.  No pretibial edema bilaterally  Abdomen:  Not distended, soft, non-tender. No rebound or rigidity.   Skin: Exposed areas without rash. Not pale. Not jaundice Neurologic:  alert & oriented X3.  Speech normal, gait appropriate for age and unassisted Strength symmetric and appropriate for age.  Psych: Cognition and judgment appear intact.  Cooperative with normal attention span and concentration.  Behavior appropriate. No anxious or depressed appearing.     Assessment     Assessment  HTN Hyperlipidemia CV: --Paroxysmal A. Fib >> ELIQUIS --Pacemaker for Mobitz 2 --Syncope 2007, ECHO aortic sclerosis, saw cardiology Osteopenia DEA 08-2006 and 10-2008 -----T score at the spine normal. DEXA 02-2011 T score of the hips was -2.0, -2.1, mild osteopenia. Declined rx as of 02/2018  DJD  Skin cancer, SCC, left leg, sees dermatology FH breast ca -sister,  brother CHF  PLAN Here for CPX HTN:BP seems to be very good, continue losartan, checking labs Hyperlipidemia: On Zetia only, checking labs Osteopenia: On calcium and vitamin D. P- A. fib, pacemaker,  saw cardiology 01/25/2019, she was felt to be stable recommended to continue same medications. Falls: Fall prevention discussed, physical therapy?  Declined referral b/c is concerned about Covid.  RF medicines as needed RTC 1 year   This visit occurred during the SARS-CoV-2 public health emergency.  Safety protocols were in place, including screening questions prior to the visit, additional usage of staff PPE, and extensive cleaning of exam room while observing appropriate contact time as indicated for disinfecting solutions.

## 2019-04-03 ENCOUNTER — Encounter: Payer: Self-pay | Admitting: *Deleted

## 2019-04-03 NOTE — Assessment & Plan Note (Signed)
Here for CPX HTN:BP seems to be very good, continue losartan, checking labs Hyperlipidemia: On Zetia only, checking labs Osteopenia: On calcium and vitamin D. P- A. fib, pacemaker, saw cardiology 01/25/2019, she was felt to be stable recommended to continue same medications. Falls: Fall prevention discussed, physical therapy?  Declined referral b/c is concerned about Covid.  RF medicines as needed RTC 1 year

## 2019-04-03 NOTE — Assessment & Plan Note (Signed)
-  Td  2016 -Pneumonia shot 2005 and 02-2017;  Prevnar: 2015 - s/p shingles shot 2011;   - s/p Shingrix  x 2 per pt  -Had a flu shot -no further screening for cervical ca -h/o abnormal MMG and breast Bx; breast bx neg 11-2014;  MMG 06-2018 (-)  -CCS: Cscope 8-09, 2 polyps;  Cscope 8-14, + polyps, no further scopes  -Abd u/s 2013 (-) for AAA  - Encouraged to continue with a healthy diet, she continue to be active appropriate for age and lives independently. -Labs: CMP, FLP, CBC

## 2019-04-23 ENCOUNTER — Other Ambulatory Visit: Payer: Self-pay | Admitting: Internal Medicine

## 2019-04-23 DIAGNOSIS — Z1231 Encounter for screening mammogram for malignant neoplasm of breast: Secondary | ICD-10-CM

## 2019-05-13 ENCOUNTER — Ambulatory Visit: Payer: Medicare Other

## 2019-05-13 ENCOUNTER — Ambulatory Visit (INDEPENDENT_AMBULATORY_CARE_PROVIDER_SITE_OTHER): Payer: Medicare Other | Admitting: *Deleted

## 2019-05-13 DIAGNOSIS — I441 Atrioventricular block, second degree: Secondary | ICD-10-CM | POA: Diagnosis not present

## 2019-05-13 LAB — CUP PACEART REMOTE DEVICE CHECK
Battery Impedance: 160 Ohm
Battery Remaining Longevity: 143 mo
Battery Voltage: 2.79 V
Brady Statistic AP VP Percent: 0 %
Brady Statistic AP VS Percent: 4 %
Brady Statistic AS VP Percent: 0 %
Brady Statistic AS VS Percent: 95 %
Date Time Interrogation Session: 20210208092615
Implantable Lead Implant Date: 20080318
Implantable Lead Implant Date: 20080318
Implantable Lead Location: 753859
Implantable Lead Location: 753860
Implantable Lead Model: 5076
Implantable Lead Model: 5076
Implantable Pulse Generator Implant Date: 20170825
Lead Channel Impedance Value: 453 Ohm
Lead Channel Impedance Value: 529 Ohm
Lead Channel Pacing Threshold Amplitude: 0.5 V
Lead Channel Pacing Threshold Amplitude: 1 V
Lead Channel Pacing Threshold Pulse Width: 0.4 ms
Lead Channel Pacing Threshold Pulse Width: 0.4 ms
Lead Channel Setting Pacing Amplitude: 2 V
Lead Channel Setting Pacing Amplitude: 2.5 V
Lead Channel Setting Pacing Pulse Width: 0.4 ms
Lead Channel Setting Sensing Sensitivity: 5.6 mV

## 2019-05-14 NOTE — Progress Notes (Signed)
PPM Remote  

## 2019-05-17 ENCOUNTER — Encounter: Payer: Self-pay | Admitting: Internal Medicine

## 2019-05-17 ENCOUNTER — Telehealth (INDEPENDENT_AMBULATORY_CARE_PROVIDER_SITE_OTHER): Payer: Medicare Other | Admitting: Internal Medicine

## 2019-05-17 ENCOUNTER — Other Ambulatory Visit: Payer: Self-pay

## 2019-05-17 VITALS — BP 126/78 | HR 83 | Ht 65.0 in | Wt 124.0 lb

## 2019-05-17 DIAGNOSIS — I10 Essential (primary) hypertension: Secondary | ICD-10-CM

## 2019-05-17 DIAGNOSIS — Z950 Presence of cardiac pacemaker: Secondary | ICD-10-CM | POA: Diagnosis not present

## 2019-05-17 DIAGNOSIS — I480 Paroxysmal atrial fibrillation: Secondary | ICD-10-CM

## 2019-05-17 DIAGNOSIS — I495 Sick sinus syndrome: Secondary | ICD-10-CM

## 2019-05-17 NOTE — Progress Notes (Signed)
Electrophysiology TeleHealth Note  Due to national recommendations of social distancing due to [place] 19, an audio telehealth visit is felt to be most appropriate for this patient at this time.  Verbal consent was obtained by me for the telehealth visit today.  The patient does not have capability for a virtual visit.  A phone visit is therefore required today.   Date:  05/17/2019   ID:  Peggy [facility]endez [facility], DOB [birth year], MRN [id number]  Location: patient's home  Provider location:  [facility]  Evaluation Performed: Follow-up visit  PCP:  Colon Branch, MD   Electrophysiologist:  Dr Rayann Heman  Chief Complaint:  palpitations  History of Present Illness:    Peggy Mendez is a 86 y.o. female who presents via telehealth conferencing today.  Since last being seen in our clinic, the patient reports doing very well.  Today, she denies symptoms of palpitations, chest pain, shortness of breath,  lower extremity edema, dizziness, presyncope, or syncope.  The patient is otherwise without complaint today.  The patient denies symptoms of fevers, chills, cough, or new SOB worrisome for COVID 19.  Past Medical History:  Diagnosis Date  . Cataract    REMOVED  . History of colonic polyps   . Hyperlipidemia   . Hypertension   . Osteopenia   . Paroxysmal atrial fibrillation (HCC)   . Sick sinus syndrome ([place])   . Skin cancer 2010   Left leg, SCC, sees derm  . Syncope 11/2005    Past Surgical History:  Procedure Laterality Date  . APPENDECTOMY    . BREAST BIOPSY Bilateral    several, 11/2014 showed fibrocystic changes, no evidence of malignancy  . BREAST BIOPSY Right 11-2015  . cataracts Bilateral   . COLONOSCOPY W/ BIOPSIES    . EP IMPLANTABLE DEVICE N/A 11/27/2015   MDT Adapta L gen change by Dr Rayann Heman  . HEMORRHOID SURGERY    . PPM  2008   MDT dual chamber PPM implanted by Dr Verlon Setting for sick sinus and intermittent CHB  . TOTAL ABDOMINAL HYSTERECTOMY     oophorectomy  .  US ECHOCARDIOGRAPHY  12/08/2005   EF 55-60%    Current Outpatient Medications  Medication Sig Dispense Refill  . Calcium Carbonate-Vitamin D (CALCIUM 600/VITAMIN D PO) Take 1 capsule by mouth daily.    . cholecalciferol (VITAMIN D) 1000 units tablet Take 2,000 Units by mouth daily.    Marland Kitchen ELIQUIS 2.5 MG TABS tablet TAKE 1 TABLET BY MOUTH  TWICE DAILY 180 tablet 3  . ezetimibe (ZETIA) 10 MG tablet TAKE 1 TABLET BY MOUTH  DAILY 90 tablet 3  . losartan (COZAAR) 50 MG tablet TAKE 1 TABLET BY MOUTH  EVERY DAY 90 tablet 3  . Misc Natural Products (TART CHERRY ADVANCED) CAPS Take 1 capsule by mouth daily.    . multivitamin (THERAGRAN) per tablet Take 1 tablet by mouth daily.      . Omega-3 Fatty Acids (FISH OIL) 1200 MG CAPS Take one tablet by mouth daily     No current facility-administered medications for this visit.    Allergies:   Anesthetics, ester; Lisinopril; and Other   Social History:  The patient  reports that she quit smoking about 30 years ago. Her smoking use included cigarettes. She has never used smokeless tobacco. She reports current alcohol use. She reports that she does not use drugs.   Family History:  The patient's family history includes Breast cancer in her sister; Coronary artery disease in  an other family member; Heart disease in her father; Heart failure in her mother; Hypertension in her sister; Prostate cancer in her brother.   ROS:  Please see the history of present illness.   All other systems are personally reviewed and negative.    Exam:    Vital Signs:  BP 126/78   Pulse 83   Ht 5\' 5"  (1.651 m)   Wt 124 lb (56.2 kg)   BMI 20.63 kg/m   Well sounding, alert and conversant   Labs/Other Tests and Data Reviewed:    Recent Labs: 04/02/2019: ALT 19; BUN 14; Creatinine, Ser 0.68; Hemoglobin 13.2; Platelets 213.0; Potassium 4.2; Sodium 136   Wt Readings from Last 3 Encounters:  05/17/19 124 lb (56.2 kg)  04/02/19 123 lb 9.6 oz (56.1 kg)  01/25/19 124 lb (56.2  kg)     Last device remote is reviewed from [facility] PDF which reveals normal device function, atrial paced 4.5%, afib burden < 1%   ASSESSMENT & PLAN:    1.  Sick sinus syndrome Remotes are uptodate Normal device function  2. Paroxysmal atrial fibrillation Well controlled (afib burden < 1%) chads2vasc score is at least 4.  Doing well with eliquis We will need to monitor eliquis closely given her advanced age to prevent toxicity.  She is on renal adjusted dose.  lavs from 12/20  3. HTN Stable No change required today  Follow-up:  with me in a year   Patient Risk:  after full review of this patients clinical status, I feel that they are at moderate risk at this time.  Today, I have spent 15 minutes with the patient with telehealth technology discussing arrhythmia management .    Army Fossa, MD  05/17/2019 9:49 AM     [facility] [address] [place] [address] [phone] (office) [phone] (fax)

## 2019-06-10 ENCOUNTER — Ambulatory Visit
Admission: RE | Admit: 2019-06-10 | Discharge: 2019-06-10 | Disposition: A | Discharge status: Home / Self Care | Payer: Medicare Other | Source: Ambulatory Visit | Attending: Internal Medicine | Admitting: Internal Medicine

## 2019-06-10 ENCOUNTER — Other Ambulatory Visit: Payer: Self-pay

## 2019-06-10 DIAGNOSIS — Z1231 Encounter for screening mammogram for malignant neoplasm of breast: Secondary | ICD-10-CM

## 2019-07-21 NOTE — Progress Notes (Signed)
Peggy Mendez Date of Birth: 1926-09-17 Medical Record [id number]  History of Present Illness: Peggy Mendez is seen for followup of Pacemaker and atrial fibrillation. She has a history of symptomatic AV block and is status post pacemaker implant. On pacemaker followup she was found to have paroxysmal atrial fibrillation.  No dizziness or syncope. She is on anticoagulation with Eliquis. Dose reduced for age and renal function.   No sustained ventricular high rates. She had generator change out of her pacemaker in August 2017.   She is doing very well from a cardiac standpoint. Is completely unaware of AFib. Pacer check in February 2021 showed Afib burden of <1%. This has varied but is generally  Less than 10%. Seen by Dr Rayann Heman and no changes made.   On follow up today she is doing very well. Denies any palpitations, dizziness, chest pain, or dyspnea. She is limited by chronic back and knee pain.   Current Outpatient Medications on File Prior to Visit  Medication Sig Dispense Refill  . Calcium Carbonate-Vitamin D (CALCIUM 600/VITAMIN D PO) Take 1 capsule by mouth daily.    . cholecalciferol (VITAMIN D) 1000 units tablet Take 2,000 Units by mouth daily.    Marland Kitchen ELIQUIS 2.5 MG TABS tablet TAKE 1 TABLET BY MOUTH  TWICE DAILY 180 tablet 3  . ezetimibe (ZETIA) 10 MG tablet TAKE 1 TABLET BY MOUTH  DAILY 90 tablet 3  . losartan (COZAAR) 50 MG tablet TAKE 1 TABLET BY MOUTH  EVERY DAY 90 tablet 3  . Misc Natural Products (TART CHERRY ADVANCED) CAPS Take 1 capsule by mouth daily.    . multivitamin (THERAGRAN) per tablet Take 1 tablet by mouth daily.      . Omega-3 Fatty Acids (FISH OIL) 1200 MG CAPS Take one tablet by mouth daily     No current facility-administered medications on file prior to visit.    Allergies  Allergen Reactions  . Anesthetics, Ester     Anesthetics not particularly Ester - nausea  . Lisinopril Cough  . Other Dermatitis and Rash    Tegaderm (clear occlusive  dressing) Pt was miserable    Past Medical History:  Diagnosis Date  . Cataract    REMOVED  . History of colonic polyps   . Hyperlipidemia   . Hypertension   . Osteopenia   . Paroxysmal atrial fibrillation (HCC)   . Sick sinus syndrome ([place])   . Skin cancer 2010   Left leg, SCC, sees derm  . Syncope 11/2005    Past Surgical History:  Procedure Laterality Date  . APPENDECTOMY    . BREAST BIOPSY Bilateral    several, 11/2014 showed fibrocystic changes, no evidence of malignancy  . BREAST BIOPSY Right 11-2015  . cataracts Bilateral   . COLONOSCOPY W/ BIOPSIES    . EP IMPLANTABLE DEVICE N/A 11/27/2015   MDT Adapta L gen change by Dr Rayann Heman  . HEMORRHOID SURGERY    . PPM  2008   MDT dual chamber PPM implanted by Dr Verlon Setting for sick sinus and intermittent CHB  . TOTAL ABDOMINAL HYSTERECTOMY     oophorectomy  . US ECHOCARDIOGRAPHY  12/08/2005   EF 55-60%    Social History   Tobacco Use  Smoking Status Former Smoker  . Types: Cigarettes  . Quit date: 08/23/1988  . Years since quitting: 30.9  Smokeless Tobacco Never Used  Tobacco Comment   >20 yrs ago, used to smoke ~ 1 ppd    Social History   Substance  and Sexual Activity  Alcohol Use Yes   Comment: socially    Family History  Problem Relation Age of Onset  . Breast cancer Sister   . Hypertension Sister   . Heart failure Mother   . Heart disease Father   . Coronary artery disease Other        F, brothers x 2other family members  . Prostate cancer Brother   . Colon cancer Neg Hx   . Diabetes Neg Hx     Review of Systems: As noted in history of present illness.  All other systems were reviewed and are negative.  Physical Exam: BP 140/62   Pulse 94   Ht 5\' 5"  (1.651 m)   Wt 126 lb (57.2 kg)   BMI 20.97 kg/m  GENERAL:  Well appearing WF in NAD HEENT:  PERRL, EOMI, sclera are clear. Oropharynx is clear. NECK:  No jugular venous distention, carotid upstroke brisk and symmetric, no bruits, no thyromegaly or  adenopathy LUNGS:  Clear to auscultation bilaterally CHEST:  Unremarkable HEART:  RRR,  PMI not displaced or sustained,S1 and S2 within normal limits, no S3, no S4: no clicks, no rubs, no murmurs ABD:  Soft, nontender. BS +, no masses or bruits. No hepatomegaly, no splenomegaly EXT:  2 + pulses throughout, no edema, no cyanosis no clubbing SKIN:  Warm and dry.  No rashes NEURO:  Alert and oriented x 3. Cranial nerves II through XII intact. PSYCH:  Cognitively intact      LABORATORY DATA: Lab Results  Component Value Date   WBC 6.1 04/02/2019   HGB 13.2 04/02/2019   HCT 39.2 04/02/2019   PLT 213.0 04/02/2019   GLUCOSE 95 04/02/2019   CHOL 198 04/02/2019   TRIG 98.0 04/02/2019   HDL 76.80 04/02/2019   LDLDIRECT 105.8 09/07/2012   LDLCALC 101 (H) 04/02/2019   ALT 19 04/02/2019   AST 24 04/02/2019   NA 136 04/02/2019   K 4.2 04/02/2019   CL 103 04/02/2019   CREATININE 0.68 04/02/2019   BUN 14 04/02/2019   CO2 26 04/02/2019   TSH 2.920 12/01/2017   INR 1.0 11/23/2015   Ecg today shows NSR with first degree AV block. Rate 94. Otherwise normal.  Assessment / Plan: 1. Paroxysmal atrial fibrillation. Patient is asymptomatic.  Low burden. Continue on Apixaban 2.5 mg twice a day. Labs in Dec were normal.   2. Mobitz type 2 second degree AV block status post pacemaker implant. Pacer checks are satisfactory.   3. Hypertension, controlled.   4. Hyperlipidemia. Intolerant to statins. On Zetia. LDL Ok.   Follow up in 6 months.

## 2019-07-30 ENCOUNTER — Ambulatory Visit: Payer: Medicare Other | Admitting: Cardiology

## 2019-07-30 ENCOUNTER — Encounter: Payer: Self-pay | Admitting: Cardiology

## 2019-07-30 ENCOUNTER — Other Ambulatory Visit: Payer: Self-pay

## 2019-07-30 VITALS — BP 140/62 | HR 94 | Ht 65.0 in | Wt 126.0 lb

## 2019-07-30 DIAGNOSIS — I10 Essential (primary) hypertension: Secondary | ICD-10-CM | POA: Diagnosis not present

## 2019-07-30 DIAGNOSIS — I441 Atrioventricular block, second degree: Secondary | ICD-10-CM

## 2019-07-30 DIAGNOSIS — I480 Paroxysmal atrial fibrillation: Secondary | ICD-10-CM | POA: Diagnosis not present

## 2019-08-12 ENCOUNTER — Ambulatory Visit (INDEPENDENT_AMBULATORY_CARE_PROVIDER_SITE_OTHER): Payer: Medicare Other | Admitting: *Deleted

## 2019-08-12 DIAGNOSIS — I442 Atrioventricular block, complete: Secondary | ICD-10-CM

## 2019-08-12 LAB — CUP PACEART REMOTE DEVICE CHECK
Battery Impedance: 208 Ohm
Battery Remaining Longevity: 134 mo
Battery Voltage: 2.79 V
Brady Statistic AP VP Percent: 0 %
Brady Statistic AP VS Percent: 5 %
Brady Statistic AS VP Percent: 0 %
Brady Statistic AS VS Percent: 95 %
Date Time Interrogation Session: 20210510101749
Implantable Lead Implant Date: 20080318
Implantable Lead Implant Date: 20080318
Implantable Lead Location: 753859
Implantable Lead Location: 753860
Implantable Lead Model: 5076
Implantable Lead Model: 5076
Implantable Pulse Generator Implant Date: 20170825
Lead Channel Impedance Value: 473 Ohm
Lead Channel Impedance Value: 631 Ohm
Lead Channel Pacing Threshold Amplitude: 0.5 V
Lead Channel Pacing Threshold Amplitude: 1.125 V
Lead Channel Pacing Threshold Pulse Width: 0.4 ms
Lead Channel Pacing Threshold Pulse Width: 0.4 ms
Lead Channel Setting Pacing Amplitude: 2 V
Lead Channel Setting Pacing Amplitude: 2.5 V
Lead Channel Setting Pacing Pulse Width: 0.4 ms
Lead Channel Setting Sensing Sensitivity: 5.6 mV

## 2019-08-13 NOTE — Progress Notes (Signed)
Remote pacemaker transmission.   

## 2019-11-11 ENCOUNTER — Ambulatory Visit (INDEPENDENT_AMBULATORY_CARE_PROVIDER_SITE_OTHER): Payer: Medicare Other | Admitting: *Deleted

## 2019-11-11 DIAGNOSIS — I441 Atrioventricular block, second degree: Secondary | ICD-10-CM

## 2019-11-11 LAB — CUP PACEART REMOTE DEVICE CHECK
Battery Impedance: 183 Ohm
Battery Remaining Longevity: 138 mo
Battery Voltage: 2.79 V
Brady Statistic AP VP Percent: 0 %
Brady Statistic AP VS Percent: 5 %
Brady Statistic AS VP Percent: 0 %
Brady Statistic AS VS Percent: 94 %
Date Time Interrogation Session: 20210809083550
Implantable Lead Implant Date: 20080318
Implantable Lead Implant Date: 20080318
Implantable Lead Location: 753859
Implantable Lead Location: 753860
Implantable Lead Model: 5076
Implantable Lead Model: 5076
Implantable Pulse Generator Implant Date: 20170825
Lead Channel Impedance Value: 479 Ohm
Lead Channel Impedance Value: 526 Ohm
Lead Channel Pacing Threshold Amplitude: 0.5 V
Lead Channel Pacing Threshold Amplitude: 1 V
Lead Channel Pacing Threshold Pulse Width: 0.4 ms
Lead Channel Pacing Threshold Pulse Width: 0.4 ms
Lead Channel Setting Pacing Amplitude: 2 V
Lead Channel Setting Pacing Amplitude: 2.5 V
Lead Channel Setting Pacing Pulse Width: 0.4 ms
Lead Channel Setting Sensing Sensitivity: 5.6 mV

## 2019-11-12 NOTE — Progress Notes (Signed)
Remote pacemaker transmission.   

## 2019-12-06 NOTE — Telephone Encounter (Signed)
Error

## 2019-12-06 NOTE — Telephone Encounter (Deleted)
Pt passed away 12/04/2019.

## 2020-02-11 NOTE — Progress Notes (Signed)
Peggy Mendez Date of Birth: [birth year] Medical Record #[id number]  History of Present Illness: Peggy Mendez is seen for followup of Pacemaker and atrial fibrillation. She has a history of symptomatic AV block and is status post pacemaker implant. On pacemaker followup she was found to have paroxysmal atrial fibrillation.  No dizziness or syncope. She is on anticoagulation with Eliquis. Dose reduced for age and renal function.   No sustained ventricular high rates. She had generator change out of her pacemaker in August 2017.   She is doing very well from a cardiac standpoint. Is completely unaware of AFib. Pacer check in August showed normal pacer function. Afib burden 1.6%.   On follow up today she is doing very well. Denies any palpitations, dizziness, chest pain, or dyspnea. She does have chronic back and knee pain so can't move quickly.   Current Outpatient Medications on File Prior to Visit  Medication Sig Dispense Refill  . Calcium Carbonate-Vitamin D (CALCIUM 600/VITAMIN D PO) Take 1 capsule by mouth daily.    . cholecalciferol (VITAMIN D) 1000 units tablet Take 2,000 Units by mouth daily.    . Misc Natural Products (TART CHERRY ADVANCED) CAPS Take 1 capsule by mouth daily.    . multivitamin (THERAGRAN) per tablet Take 1 tablet by mouth daily.      . Omega-3 Fatty Acids (FISH OIL) 1200 MG CAPS Take one tablet by mouth daily     No current facility-administered medications on file prior to visit.    Allergies  Allergen Reactions  . Anesthetics, Ester     Anesthetics not particularly Ester - nausea  . Lisinopril Cough  . Other Dermatitis and Rash    Tegaderm (clear occlusive dressing) Pt was miserable    Past Medical History:  Diagnosis Date  . Cataract    REMOVED  . History of colonic polyps   . Hyperlipidemia   . Hypertension   . Osteopenia   . Paroxysmal atrial fibrillation (HCC)   . Sick sinus syndrome ([place])   . Skin cancer 2010   Left leg, SCC, sees  derm  . Syncope 11/2005    Past Surgical History:  Procedure Laterality Date  . APPENDECTOMY    . BREAST BIOPSY Bilateral    several, 11/2014 showed fibrocystic changes, no evidence of malignancy  . BREAST BIOPSY Right 11-2015  . cataracts Bilateral   . COLONOSCOPY W/ BIOPSIES    . EP IMPLANTABLE DEVICE N/A 11/27/2015   MDT Adapta L gen change by Dr Rayann Heman  . HEMORRHOID SURGERY    . PPM  2008   MDT dual chamber PPM implanted by Dr Verlon Setting for sick sinus and intermittent CHB  . TOTAL ABDOMINAL HYSTERECTOMY     oophorectomy  . US ECHOCARDIOGRAPHY  12/08/2005   EF 55-60%    Social History   Tobacco Use  Smoking Status Former Smoker  . Types: Cigarettes  . Quit date: 08/23/1988  . Years since quitting: 31.4  Smokeless Tobacco Never Used  Tobacco Comment   >20 yrs ago, used to smoke ~ 1 ppd    Social History   Substance and Sexual Activity  Alcohol Use Yes   Comment: socially    Family History  Problem Relation Age of Onset  . Breast cancer Sister   . Hypertension Sister   . Heart failure Mother   . Heart disease Father   . Coronary artery disease Other        F, brothers x 2other family members  . Prostate  cancer Brother   . Colon cancer Neg Hx   . Diabetes Neg Hx     Review of Systems: As noted in history of present illness.  All other systems were reviewed and are negative.  Physical Exam: BP 128/60 (BP Location: Left Arm, Patient Position: Sitting, Cuff Size: Normal)   Pulse 76   Ht 5\' 5"  (1.651 m)   BMI 20.97 kg/m  GENERAL:  Well appearing WF in NAD HEENT:  PERRL, EOMI, sclera are clear. Oropharynx is clear. NECK:  No jugular venous distention, carotid upstroke brisk and symmetric, no bruits, no thyromegaly or adenopathy LUNGS:  Clear to auscultation bilaterally CHEST:  Unremarkable HEART:  RRR,  PMI not displaced or sustained,S1 and S2 within normal limits, no S3, no S4: no clicks, no rubs, no murmurs ABD:  Soft, nontender. BS +, no masses or bruits. No  hepatomegaly, no splenomegaly EXT:  2 + pulses throughout, no edema, no cyanosis no clubbing SKIN:  Warm and dry.  No rashes NEURO:  Alert and oriented x 3. Cranial nerves II through XII intact. PSYCH:  Cognitively intact      LABORATORY DATA: Lab Results  Component Value Date   WBC 6.1 04/02/2019   HGB 13.2 04/02/2019   HCT 39.2 04/02/2019   PLT 213.0 04/02/2019   GLUCOSE 95 04/02/2019   CHOL 198 04/02/2019   TRIG 98.0 04/02/2019   HDL 76.80 04/02/2019   LDLDIRECT 105.8 09/07/2012   LDLCALC 101 (H) 04/02/2019   ALT 19 04/02/2019   AST 24 04/02/2019   NA 136 04/02/2019   K 4.2 04/02/2019   CL 103 04/02/2019   CREATININE 0.68 04/02/2019   BUN 14 04/02/2019   CO2 26 04/02/2019   TSH 2.920 12/01/2017   INR 1.0 11/23/2015     Assessment / Plan: 1. Paroxysmal atrial fibrillation. Patient is asymptomatic.  Low burden. Continue on Apixaban 2.5 mg twice a day. She is scheduled for lab work with Dr Larose Kells in December.   2. Mobitz type 2 second degree AV block status post pacemaker implant. Pacer checks are satisfactory. Follow up with Dr Rayann Heman in February.  3. Hypertension, controlled.   4. Hyperlipidemia. Intolerant to statins. On Zetia.   Follow up in 6 months.

## 2020-02-13 ENCOUNTER — Encounter: Payer: Self-pay | Admitting: Cardiology

## 2020-02-13 ENCOUNTER — Ambulatory Visit: Payer: Medicare Other | Admitting: Cardiology

## 2020-02-13 VITALS — BP 128/60 | HR 76 | Ht 65.0 in

## 2020-02-13 DIAGNOSIS — I10 Essential (primary) hypertension: Secondary | ICD-10-CM | POA: Diagnosis not present

## 2020-02-13 DIAGNOSIS — I480 Paroxysmal atrial fibrillation: Secondary | ICD-10-CM

## 2020-02-13 DIAGNOSIS — I441 Atrioventricular block, second degree: Secondary | ICD-10-CM

## 2020-02-13 MED ORDER — APIXABAN 2.5 MG PO TABS: 2.5000 mg | ORAL_TABLET | Freq: Two times a day (BID) | ORAL | 3 refills | Status: DC

## 2020-02-13 MED ORDER — LOSARTAN POTASSIUM 50 MG PO TABS: 50.0000 mg | ORAL_TABLET | Freq: Every day | ORAL | 3 refills | Status: DC

## 2020-02-13 MED ORDER — EZETIMIBE 10 MG PO TABS: 10.0000 mg | ORAL_TABLET | Freq: Every day | ORAL | 3 refills | Status: DC

## 2020-02-19 ENCOUNTER — Ambulatory Visit (INDEPENDENT_AMBULATORY_CARE_PROVIDER_SITE_OTHER): Payer: Medicare Other

## 2020-02-19 DIAGNOSIS — I441 Atrioventricular block, second degree: Secondary | ICD-10-CM

## 2020-02-20 LAB — CUP PACEART REMOTE DEVICE CHECK
Battery Impedance: 207 Ohm
Battery Remaining Longevity: 133 mo
Battery Voltage: 2.79 V
Brady Statistic AP VP Percent: 0 %
Brady Statistic AP VS Percent: 6 %
Brady Statistic AS VP Percent: 0 %
Brady Statistic AS VS Percent: 94 %
Date Time Interrogation Session: 20211117133354
Implantable Lead Implant Date: 20080318
Implantable Lead Implant Date: 20080318
Implantable Lead Location: 753859
Implantable Lead Location: 753860
Implantable Lead Model: 5076
Implantable Lead Model: 5076
Implantable Pulse Generator Implant Date: 20170825
Lead Channel Impedance Value: 446 Ohm
Lead Channel Impedance Value: 560 Ohm
Lead Channel Pacing Threshold Amplitude: 0.5 V
Lead Channel Pacing Threshold Amplitude: 0.875 V
Lead Channel Pacing Threshold Pulse Width: 0.4 ms
Lead Channel Pacing Threshold Pulse Width: 0.4 ms
Lead Channel Setting Pacing Amplitude: 2 V
Lead Channel Setting Pacing Amplitude: 2.5 V
Lead Channel Setting Pacing Pulse Width: 0.4 ms
Lead Channel Setting Sensing Sensitivity: 5.6 mV

## 2020-02-20 NOTE — Progress Notes (Signed)
Remote pacemaker transmission.   

## 2020-04-02 ENCOUNTER — Encounter: Payer: Self-pay | Admitting: Internal Medicine

## 2020-04-02 ENCOUNTER — Other Ambulatory Visit: Payer: Self-pay

## 2020-04-02 ENCOUNTER — Ambulatory Visit: Payer: Medicare Other | Admitting: Internal Medicine

## 2020-04-02 ENCOUNTER — Ambulatory Visit (INDEPENDENT_AMBULATORY_CARE_PROVIDER_SITE_OTHER): Payer: Medicare Other | Admitting: Internal Medicine

## 2020-04-02 VITALS — BP 145/74 | HR 77 | Temp 98.3°F | Resp 16 | Ht 65.0 in | Wt 125.2 lb

## 2020-04-02 DIAGNOSIS — E7800 Pure hypercholesterolemia, unspecified: Secondary | ICD-10-CM | POA: Diagnosis not present

## 2020-04-02 DIAGNOSIS — Z0000 Encounter for general adult medical examination without abnormal findings: Secondary | ICD-10-CM | POA: Diagnosis not present

## 2020-04-02 DIAGNOSIS — I10 Essential (primary) hypertension: Secondary | ICD-10-CM | POA: Diagnosis not present

## 2020-04-02 LAB — CBC WITH DIFFERENTIAL/PLATELET
Basophils Absolute: 0.1 10*3/uL (ref 0.0–0.1)
Basophils Relative: 0.9 % (ref 0.0–3.0)
Eosinophils Absolute: 0.1 10*3/uL (ref 0.0–0.7)
Eosinophils Relative: 2.1 % (ref 0.0–5.0)
HCT: 38.7 % (ref 36.0–46.0)
Hemoglobin: 12.8 g/dL (ref 12.0–15.0)
Lymphocytes Relative: 25.3 % (ref 12.0–46.0)
Lymphs Abs: 1.7 10*3/uL (ref 0.7–4.0)
MCHC: 33.2 g/dL (ref 30.0–36.0)
MCV: 85.7 fl (ref 78.0–100.0)
Monocytes Absolute: 0.8 10*3/uL (ref 0.1–1.0)
Monocytes Relative: 11.4 % (ref 3.0–12.0)
Neutro Abs: 4 10*3/uL (ref 1.4–7.7)
Neutrophils Relative %: 60.3 % (ref 43.0–77.0)
Platelets: 222 10*3/uL (ref 150.0–400.0)
RBC: 4.51 Mil/uL (ref 3.87–5.11)
RDW: 13.4 % (ref 11.5–15.5)
WBC: 6.7 10*3/uL (ref 4.0–10.5)

## 2020-04-02 LAB — LIPID PANEL
Cholesterol: 193 mg/dL (ref 0–200)
HDL: 83.6 mg/dL (ref >39.00)
LDL Cholesterol: 98 mg/dL (ref 0–99)
NonHDL: 109.17
Total CHOL/HDL Ratio: 2
Triglycerides: 58 mg/dL (ref 0.0–149.0)
VLDL: 11.6 mg/dL (ref 0.0–40.0)

## 2020-04-02 LAB — COMPREHENSIVE METABOLIC PANEL
ALT: 18 U/L (ref 0–35)
AST: 22 U/L (ref 0–37)
Albumin: 4 g/dL (ref 3.5–5.2)
Alkaline Phosphatase: 66 U/L (ref 39–117)
BUN: 17 mg/dL (ref 6–23)
CO2: 28 mEq/L (ref 19–32)
Calcium: 9 mg/dL (ref 8.4–10.5)
Chloride: 104 mEq/L (ref 96–112)
Creatinine, Ser: 0.78 mg/dL (ref 0.40–1.20)
GFR: 66.33 mL/min (ref >60.00)
Glucose, Bld: 91 mg/dL (ref 70–99)
Potassium: 4.2 mEq/L (ref 3.5–5.1)
Sodium: 139 mEq/L (ref 135–145)
Total Bilirubin: 0.8 mg/dL (ref 0.2–1.2)
Total Protein: 6.4 g/dL (ref 6.0–8.3)

## 2020-04-02 LAB — TSH: TSH: 3.49 u[IU]/mL (ref 0.35–4.50)

## 2020-04-02 NOTE — Progress Notes (Signed)
Subjective:    Patient ID: Peggy Mendez, female    DOB: 11/11/1926, 86 y.o.   MRN: [id number]  DOS:  04/02/2020 Type of visit - description: cpx Since the last office visit she is doing well. No major changes except that she lost her twin sister.  That was of course very hard but she is grieving in a healthy way it seems.   Review of Systems  Other than above, a 14 point review of systems is negative     Past Medical History:  Diagnosis Date  . Cataract    REMOVED  . History of colonic polyps   . Hyperlipidemia   . Hypertension   . Osteopenia   . Paroxysmal atrial fibrillation (HCC)   . Sick sinus syndrome ([place])   . Skin cancer 2010   Left leg, SCC, sees derm  . Syncope 11/2005    Past Surgical History:  Procedure Laterality Date  . APPENDECTOMY    . BREAST BIOPSY Bilateral    several, 11/2014 showed fibrocystic changes, no evidence of malignancy  . BREAST BIOPSY Right 11-2015  . cataracts Bilateral   . COLONOSCOPY W/ BIOPSIES    . EP IMPLANTABLE DEVICE N/A 11/27/2015   MDT Adapta L gen change by Dr Rayann Heman  . HEMORRHOID SURGERY    . PPM  2008   MDT dual chamber PPM implanted by Dr Verlon Setting for sick sinus and intermittent CHB  . TOTAL ABDOMINAL HYSTERECTOMY     oophorectomy  . US ECHOCARDIOGRAPHY  12/08/2005   EF 55-60%    Allergies as of 04/02/2020      Reactions   Anesthetics, Ester    Anesthetics not particularly Ester - nausea   Lisinopril Cough   Other Dermatitis, Rash   Tegaderm (clear occlusive dressing) Pt was miserable      Medication List       Accurate as of April 02, 2020 11:59 PM. If you have any questions, ask your nurse or doctor.        apixaban 2.5 MG Tabs tablet Commonly known as: Eliquis Take 1 tablet (2.5 mg total) by mouth 2 (two) times daily.   CALCIUM 600/VITAMIN D PO Take 1 capsule by mouth daily.   cholecalciferol 1000 units tablet Commonly known as: VITAMIN D Take 2,000 Units by mouth daily.   ezetimibe 10 MG  tablet Commonly known as: ZETIA Take 1 tablet (10 mg total) by mouth daily.   Fish Oil 1200 MG Caps Take one tablet by mouth daily   losartan 50 MG tablet Commonly known as: COZAAR Take 1 tablet (50 mg total) by mouth daily.   multivitamin per tablet Take 1 tablet by mouth daily.   Tart Cherry Advanced Caps Take 1 capsule by mouth daily.          Objective:   Physical Exam BP (!) 145/74 (BP Location: Left Arm, Patient Position: Sitting, Cuff Size: Small)   Pulse 77   Temp 98.3 F (36.8 C) (Oral)   Resp 16   Ht 5\' 5"  (1.651 m)   Wt 125 lb 4 oz (56.8 kg)   SpO2 98%   BMI 20.84 kg/m  General: Well developed, NAD, BMI noted Neck: No  thyromegaly  HEENT:  Normocephalic . Face symmetric, atraumatic Lungs:  CTA B Normal respiratory effort, no intercostal retractions, no accessory muscle use. Heart: RRR,  no murmur.  Abdomen:  Not distended, soft, non-tender. No rebound or rigidity.   Lower extremities: no pretibial edema bilaterally  Skin: Exposed  areas without rash. Not pale. Not jaundice Neurologic:  alert & oriented X3.  Speech normal, gait unassisted but somewhat limited by DJD (knees) Strength symmetric and appropriate for age.  Psych: Cognition and judgment appear intact.  Cooperative with normal attention span and concentration.  Behavior appropriate. No anxious or depressed appearing.     Assessment     Assessment  HTN Hyperlipidemia CV: --Paroxysmal A. Fib >> ELIQUIS --Pacemaker for Mobitz 2 --Syncope 2007, ECHO aortic sclerosis, saw cardiology Osteopenia DEA 08-2006 and 10-2008 -----T score at the spine normal. DEXA 02-2011 T score of the hips was -2.0, -2.1, mild osteopenia. Declined rx as of 02/2018  DJD  Skin cancer, SCC, left leg, sees dermatology FH breast ca -sister, brother CHF  PLAN Here for CPX HTN: Ambulatory BP typically less than 140/80.  On losartan.  Checking labs Hyperlipidemia: On Zetia, checking labs Cardiovascular: Saw  cardiology 02/13/2020, felt to be stable, no changes made Osteopenia: Again declined treatment other than calcium and vitamin D. Skin cancer: Sees Derm regularly. RTC 1 year.  This visit occurred during the SARS-CoV-2 public health emergency.  Safety protocols were in place, including screening questions prior to the visit, additional usage of staff PPE, and extensive cleaning of exam room while observing appropriate contact time as indicated for disinfecting solutions.

## 2020-04-02 NOTE — Patient Instructions (Addendum)
Check the  blood pressure regularly °BP GOAL is between 110/65 and  135/85. °If it is consistently higher or lower, let me know °  ° °GO TO THE LAB : Get the blood work   ° ° °GO TO THE FRONT DESK, PLEASE SCHEDULE YOUR APPOINTMENTS °Come back for   a physical exam in 1 year °

## 2020-04-02 NOTE — Progress Notes (Signed)
Pre visit review using our clinic review tool, if applicable. No additional management support is needed unless otherwise documented below in the visit note. 

## 2020-04-04 ENCOUNTER — Encounter: Payer: Self-pay | Admitting: Internal Medicine

## 2020-04-04 NOTE — Assessment & Plan Note (Signed)
Here for CPX HTN: Ambulatory BP typically less than 140/80.  On losartan.  Checking labs Hyperlipidemia: On Zetia, checking labs Cardiovascular: Saw cardiology 02/13/2020, felt to be stable, no changes made Osteopenia: Again declined treatment other than calcium and vitamin D. Skin cancer: Sees Derm regularly. RTC 1 year.

## 2020-04-04 NOTE — Assessment & Plan Note (Signed)
-  Td 2016 -PNM 23: 2005, 02-2017; Prevnar: 2015 - s/p shingles shot 2011;  s/p Shingrix x 2 per pt  -COVID vaccines x3 Had a flu shot -no further screening for cervical ca -h/o abnormal MMG and breast Bx; breast bx neg 11-2014; MMG 06-2019 ([facility]) -GXQ:JJHERD 8-09, 2 polyps;Cscope 8-14, + polyps, no further scopes  -Abd u/s 2013 (-) for AAA  -Lives independently, drives.  Feels well. -Labs: CMP, FLP, CBC, TSH -Advance directives: States she has all her documents in order but declines to bring a copy

## 2020-05-11 ENCOUNTER — Other Ambulatory Visit: Payer: Self-pay | Admitting: Internal Medicine

## 2020-05-11 DIAGNOSIS — Z1231 Encounter for screening mammogram for malignant neoplasm of breast: Secondary | ICD-10-CM

## 2020-05-20 ENCOUNTER — Ambulatory Visit (INDEPENDENT_AMBULATORY_CARE_PROVIDER_SITE_OTHER): Payer: Medicare Other

## 2020-05-20 DIAGNOSIS — I441 Atrioventricular block, second degree: Secondary | ICD-10-CM | POA: Diagnosis not present

## 2020-05-20 LAB — CUP PACEART REMOTE DEVICE CHECK
Battery Impedance: 232 Ohm
Battery Remaining Longevity: 129 mo
Battery Voltage: 2.79 V
Brady Statistic AP VP Percent: 0 %
Brady Statistic AP VS Percent: 6 %
Brady Statistic AS VP Percent: 0 %
Brady Statistic AS VS Percent: 94 %
Date Time Interrogation Session: 20220216081630
Implantable Lead Implant Date: 20080318
Implantable Lead Implant Date: 20080318
Implantable Lead Location: 753859
Implantable Lead Location: 753860
Implantable Lead Model: 5076
Implantable Lead Model: 5076
Implantable Pulse Generator Implant Date: 20170825
Lead Channel Impedance Value: 460 Ohm
Lead Channel Impedance Value: 526 Ohm
Lead Channel Pacing Threshold Amplitude: 0.375 V
Lead Channel Pacing Threshold Amplitude: 0.875 V
Lead Channel Pacing Threshold Pulse Width: 0.4 ms
Lead Channel Pacing Threshold Pulse Width: 0.4 ms
Lead Channel Setting Pacing Amplitude: 2 V
Lead Channel Setting Pacing Amplitude: 2.5 V
Lead Channel Setting Pacing Pulse Width: 0.4 ms
Lead Channel Setting Sensing Sensitivity: 5.6 mV

## 2020-05-27 NOTE — Progress Notes (Signed)
Remote pacemaker transmission.   

## 2020-06-26 ENCOUNTER — Other Ambulatory Visit: Payer: Self-pay

## 2020-06-26 ENCOUNTER — Ambulatory Visit
Admission: RE | Admit: 2020-06-26 | Discharge: 2020-06-26 | Disposition: A | Discharge status: Home / Self Care | Payer: Medicare Other | Source: Ambulatory Visit | Attending: Internal Medicine | Admitting: Internal Medicine

## 2020-06-26 DIAGNOSIS — Z1231 Encounter for screening mammogram for malignant neoplasm of breast: Secondary | ICD-10-CM

## 2020-08-07 NOTE — Progress Notes (Deleted)
Deone Omahoney Palladino Date of Birth: [birth year] Medical Record #[id number]  History of Present Illness: Mrs. Wolfert is seen for followup of Pacemaker and atrial fibrillation. She has a history of symptomatic AV block and is status post pacemaker implant. On pacemaker followup she was found to have paroxysmal atrial fibrillation.  No dizziness or syncope. She is on anticoagulation with Eliquis. Dose reduced for age and renal function.   No sustained ventricular high rates. She had generator change out of her pacemaker in August 2017.   She is doing very well from a cardiac standpoint. Is completely unaware of AFib. Pacer check in August showed normal pacer function. Afib burden 1.6%.   On follow up today she is doing very well. Denies any palpitations, dizziness, chest pain, or dyspnea. She does have chronic back and knee pain so can't move quickly.   Current Outpatient Medications on File Prior to Visit  Medication Sig Dispense Refill  . apixaban (ELIQUIS) 2.5 MG TABS tablet Take 1 tablet (2.5 mg total) by mouth 2 (two) times daily. 180 tablet 3  . Calcium Carbonate-Vitamin D (CALCIUM 600/VITAMIN D PO) Take 1 capsule by mouth daily.    . cholecalciferol (VITAMIN D) 1000 units tablet Take 2,000 Units by mouth daily.    Marland Kitchen ezetimibe (ZETIA) 10 MG tablet Take 1 tablet (10 mg total) by mouth daily. 90 tablet 3  . losartan (COZAAR) 50 MG tablet Take 1 tablet (50 mg total) by mouth daily. 90 tablet 3  . Misc Natural Products (TART CHERRY ADVANCED) CAPS Take 1 capsule by mouth daily.    . multivitamin (THERAGRAN) per tablet Take 1 tablet by mouth daily.    . Omega-3 Fatty Acids (FISH OIL) 1200 MG CAPS Take one tablet by mouth daily     No current facility-administered medications on file prior to visit.    Allergies  Allergen Reactions  . Anesthetics, Ester     Anesthetics not particularly Ester - nausea  . Lisinopril Cough  . Other Dermatitis and Rash    Tegaderm (clear occlusive  dressing) Pt was miserable    Past Medical History:  Diagnosis Date  . Cataract    REMOVED  . History of colonic polyps   . Hyperlipidemia   . Hypertension   . Osteopenia   . Paroxysmal atrial fibrillation (HCC)   . Sick sinus syndrome ([place])   . Skin cancer 2010   Left leg, SCC, sees derm  . Syncope 11/2005    Past Surgical History:  Procedure Laterality Date  . APPENDECTOMY    . BREAST BIOPSY Bilateral    several, 11/2014 showed fibrocystic changes, no evidence of malignancy  . BREAST BIOPSY Right 11-2015  . cataracts Bilateral   . COLONOSCOPY W/ BIOPSIES    . EP IMPLANTABLE DEVICE N/A 11/27/2015   MDT Adapta L gen change by Dr Rayann Heman  . HEMORRHOID SURGERY    . PPM  2008   MDT dual chamber PPM implanted by Dr Verlon Setting for sick sinus and intermittent CHB  . TOTAL ABDOMINAL HYSTERECTOMY     oophorectomy  . US ECHOCARDIOGRAPHY  12/08/2005   EF 55-60%    Social History   Tobacco Use  Smoking Status Former Smoker  . Types: Cigarettes  . Quit date: 08/23/1988  . Years since quitting: 31.9  Smokeless Tobacco Never Used  Tobacco Comment   >20 yrs ago, used to smoke ~ 1 ppd    Social History   Substance and Sexual Activity  Alcohol Use Yes  Comment: socially    Family History  Problem Relation Age of Onset  . Breast cancer Sister   . Hypertension Sister   . Heart failure Mother   . Heart disease Father   . Coronary artery disease Other        F, brothers x 2other family members  . Prostate cancer Brother   . Colon cancer Neg Hx   . Diabetes Neg Hx     Review of Systems: As noted in history of present illness.  All other systems were reviewed and are negative.  Physical Exam: There were no vitals taken for this visit. GENERAL:  Well appearing WF in NAD HEENT:  PERRL, EOMI, sclera are clear. Oropharynx is clear. NECK:  No jugular venous distention, carotid upstroke brisk and symmetric, no bruits, no thyromegaly or adenopathy LUNGS:  Clear to auscultation  bilaterally CHEST:  Unremarkable HEART:  RRR,  PMI not displaced or sustained,S1 and S2 within normal limits, no S3, no S4: no clicks, no rubs, no murmurs ABD:  Soft, nontender. BS +, no masses or bruits. No hepatomegaly, no splenomegaly EXT:  2 + pulses throughout, no edema, no cyanosis no clubbing SKIN:  Warm and dry.  No rashes NEURO:  Alert and oriented x 3. Cranial nerves II through XII intact. PSYCH:  Cognitively intact      LABORATORY DATA: Lab Results  Component Value Date   WBC 6.7 04/02/2020   HGB 12.8 04/02/2020   HCT 38.7 04/02/2020   PLT 222.0 04/02/2020   GLUCOSE 91 04/02/2020   CHOL 193 04/02/2020   TRIG 58.0 04/02/2020   HDL 83.60 04/02/2020   LDLDIRECT 105.8 09/07/2012   LDLCALC 98 04/02/2020   ALT 18 04/02/2020   AST 22 04/02/2020   NA 139 04/02/2020   K 4.2 04/02/2020   CL 104 04/02/2020   CREATININE 0.78 04/02/2020   BUN 17 04/02/2020   CO2 28 04/02/2020   TSH 3.49 04/02/2020   INR 1.0 11/23/2015     Assessment / Plan: 1. Paroxysmal atrial fibrillation. Patient is asymptomatic.  Low burden. Continue on Apixaban 2.5 mg twice a day. She is scheduled for lab work with Dr Larose Kells in December.   2. Mobitz type 2 second degree AV block status post pacemaker implant. Pacer checks are satisfactory. Follow up with Dr Rayann Heman in February.  3. Hypertension, controlled.   4. Hyperlipidemia. Intolerant to statins. On Zetia.   Follow up in 6 months.

## 2020-08-13 ENCOUNTER — Ambulatory Visit: Payer: Medicare Other | Admitting: Cardiology

## 2020-08-19 ENCOUNTER — Ambulatory Visit (INDEPENDENT_AMBULATORY_CARE_PROVIDER_SITE_OTHER): Payer: Medicare Other

## 2020-08-19 DIAGNOSIS — I441 Atrioventricular block, second degree: Secondary | ICD-10-CM | POA: Diagnosis not present

## 2020-08-20 ENCOUNTER — Telehealth: Payer: Self-pay

## 2020-08-20 LAB — CUP PACEART REMOTE DEVICE CHECK
Battery Impedance: 232 Ohm
Battery Remaining Longevity: 128 mo
Battery Voltage: 2.79 V
Brady Statistic AP VP Percent: 0 %
Brady Statistic AP VS Percent: 5 %
Brady Statistic AS VP Percent: 0 %
Brady Statistic AS VS Percent: 94 %
Date Time Interrogation Session: 20220518104508
Implantable Lead Implant Date: 20080318
Implantable Lead Implant Date: 20080318
Implantable Lead Location: 753859
Implantable Lead Location: 753860
Implantable Lead Model: 5076
Implantable Lead Model: 5076
Implantable Pulse Generator Implant Date: 20170825
Lead Channel Impedance Value: 435 Ohm
Lead Channel Impedance Value: 494 Ohm
Lead Channel Pacing Threshold Amplitude: 0.375 V
Lead Channel Pacing Threshold Amplitude: 0.875 V
Lead Channel Pacing Threshold Pulse Width: 0.4 ms
Lead Channel Pacing Threshold Pulse Width: 0.4 ms
Lead Channel Setting Pacing Amplitude: 2 V
Lead Channel Setting Pacing Amplitude: 2.5 V
Lead Channel Setting Pacing Pulse Width: 0.4 ms
Lead Channel Setting Sensing Sensitivity: 5.6 mV

## 2020-08-20 NOTE — Telephone Encounter (Signed)
Clinical alert received persistent AF since 05/28/20. AT/AF burden increased from 2.4 to 11.3. V rates controlled. Last EP visit 05/16/20 with Dr. Rayann Heman via telemedicine.   Successful telephone encounter with patient. States she is doing well and unaware of persistent AF. Denies s/s including CP/pressure, palpitations, shortness of breath, dizziness, or lack of energy. Patient acknowledges compliance with Eliquis 2.5 mg BID. She is amendible to scheduling overdue in-clinic follow up with Dr. Rayann Heman. She will see Almyra Deforest with cardiology 09/02/20. Patient is reminded of s/s of cardiac emergencies and when to call 911. She is provided device clinic contact information [phone]) for additional questions or concerns. Forwarding to scheduling.

## 2020-08-25 ENCOUNTER — Encounter: Payer: Self-pay | Admitting: Nurse Practitioner

## 2020-08-25 ENCOUNTER — Other Ambulatory Visit: Payer: Self-pay

## 2020-08-25 ENCOUNTER — Ambulatory Visit: Payer: Medicare Other | Admitting: Nurse Practitioner

## 2020-08-25 VITALS — BP 136/70 | HR 95 | Ht 65.0 in | Wt 121.6 lb

## 2020-08-25 DIAGNOSIS — I10 Essential (primary) hypertension: Secondary | ICD-10-CM | POA: Diagnosis not present

## 2020-08-25 DIAGNOSIS — I480 Paroxysmal atrial fibrillation: Secondary | ICD-10-CM

## 2020-08-25 DIAGNOSIS — I441 Atrioventricular block, second degree: Secondary | ICD-10-CM | POA: Diagnosis not present

## 2020-08-25 LAB — BASIC METABOLIC PANEL
BUN/Creatinine Ratio: 15 (ref 12–28)
BUN: 12 mg/dL (ref 10–36)
CO2: 23 mmol/L (ref 20–29)
Calcium: 9.7 mg/dL (ref 8.7–10.3)
Chloride: 100 mmol/L (ref 96–106)
Creatinine, Ser: 0.79 mg/dL (ref 0.57–1.00)
Glucose: 99 mg/dL (ref 65–99)
Potassium: 4.5 mmol/L (ref 3.5–5.2)
Sodium: 138 mmol/L (ref 134–144)
eGFR: 71 mL/min/{1.73_m2} (ref >59)

## 2020-08-25 LAB — CBC
Hematocrit: 44 % (ref 34.0–46.6)
Hemoglobin: 14.7 g/dL (ref 11.1–15.9)
MCH: 28.7 pg (ref 26.6–33.0)
MCHC: 33.4 g/dL (ref 31.5–35.7)
MCV: 86 fL (ref 79–97)
Platelets: 239 10*3/uL (ref 150–450)
RBC: 5.12 x10E6/uL (ref 3.77–5.28)
RDW: 12.6 % (ref 11.7–15.4)
WBC: 6.5 10*3/uL (ref 3.4–10.8)

## 2020-08-25 MED ORDER — METOPROLOL TARTRATE 25 MG PO TABS: 12.5000 mg | ORAL_TABLET | Freq: Two times a day (BID) | ORAL | 3 refills | Status: DC

## 2020-08-25 NOTE — Patient Instructions (Addendum)
Medication Instructions:  Your physician has recommended you make the following change in your medication:   1. Begin Metoprolol Tartrate, 1/2 tablet (12.5mg ) two times per day   Labwork: You will have labs drawn today: CBC and BMP  Testing/Procedures: None ordered.   Follow-Up: Your physician recommends that you schedule a follow-up appointment in:   Keep your appointment with Almyra Deforest on June 1 at 3:15pm   Any Other Special Instructions Will Be Listed Below (If Applicable).     If you need a refill on your cardiac medications before your next appointment, please call your pharmacy.

## 2020-08-25 NOTE — Progress Notes (Signed)
Electrophysiology Office Note Date: 08/25/2020  ID:  Peggy Mendez, DOB 05/06/1926, MRN [id number]  PCP: Colon Branch, MD Primary Cardiologist: [place] Electrophysiologist: Allred  CC: Pacemaker follow-up  Peggy Mendez is a 87 y.o. female seen today for Dr Rayann Heman.  She presents today for routine electrophysiology followup.  Since last being seen in our clinic, the patient reports doing very well.  Her main complaints are with arthritis. She denies chest pain, palpitations, dyspnea, PND, orthopnea, nausea, vomiting, dizziness, syncope, edema, weight gain, or early satiety.  Device History: MDT dual chamber PPM implanted 2008 for CHB, gen change 2017   Past Medical History:  Diagnosis Date  . Cataract    REMOVED  . History of colonic polyps   . Hyperlipidemia   . Hypertension   . Osteopenia   . Paroxysmal atrial fibrillation (HCC)   . Sick sinus syndrome ([place])   . Skin cancer 2010   Left leg, SCC, sees derm  . Syncope 11/2005   Past Surgical History:  Procedure Laterality Date  . APPENDECTOMY    . BREAST BIOPSY Bilateral    several, 11/2014 showed fibrocystic changes, no evidence of malignancy  . BREAST BIOPSY Right 11-2015  . cataracts Bilateral   . COLONOSCOPY W/ BIOPSIES    . EP IMPLANTABLE DEVICE N/A 11/27/2015   MDT Adapta L gen change by Dr Rayann Heman  . HEMORRHOID SURGERY    . PPM  2008   MDT dual chamber PPM implanted by Dr Verlon Setting for sick sinus and intermittent CHB  . TOTAL ABDOMINAL HYSTERECTOMY     oophorectomy  . US ECHOCARDIOGRAPHY  12/08/2005   EF 55-60%    Current Outpatient Medications  Medication Sig Dispense Refill  . apixaban (ELIQUIS) 2.5 MG TABS tablet Take 1 tablet (2.5 mg total) by mouth 2 (two) times daily. 180 tablet 3  . Calcium Carbonate-Vitamin D (CALCIUM 600/VITAMIN D PO) Take 1 capsule by mouth daily.    . cholecalciferol (VITAMIN D) 1000 units tablet Take 2,000 Units by mouth daily.    Marland Kitchen ezetimibe (ZETIA) 10 MG tablet Take 1  tablet (10 mg total) by mouth daily. 90 tablet 3  . losartan (COZAAR) 50 MG tablet Take 1 tablet (50 mg total) by mouth daily. 90 tablet 3  . Misc Natural Products (TART CHERRY ADVANCED) CAPS Take 1 capsule by mouth daily.    . multivitamin (THERAGRAN) per tablet Take 1 tablet by mouth daily.    . Omega-3 Fatty Acids (FISH OIL) 1200 MG CAPS Take one tablet by mouth daily     No current facility-administered medications for this visit.    Allergies:   Anesthetics, ester; Lisinopril; and Other   Social History: Social History   Socioeconomic History  . Marital status: Single    Spouse name: Not on file  . Number of children: 0  . Years of education: Not on file  . Highest education level: Not on file  Occupational History  . Occupation: retired     Fish farm manager: RETIRED  Tobacco Use  . Smoking status: Former Smoker    Types: Cigarettes    Quit date: 08/23/1988    Years since quitting: 32.0  . Smokeless tobacco: Never Used  . Tobacco comment: >20 yrs ago, used to smoke ~ 1 ppd  Vaping Use  . Vaping Use: Never used  Substance and Sexual Activity  . Alcohol use: Yes    Comment: socially  . Drug use: Yes    Types: IV  . Sexual  activity: Not on file  Other Topics Concern  . Not on file  Social History Narrative   Lives by herself, in a condominium,  still drives.   Lost her  twin sister 12/2019   Has a nephew out of state   Brother in Sports coach lives @ [facility]    Social Determinants of Health   Financial Resource Strain: Not on file  Food Insecurity: Not on file  Transportation Needs: Not on file  Physical Activity: Not on file  Stress: Not on file  Social Connections: Not on file  Intimate Partner Violence: Not on file    Family History: Family History  Problem Relation Age of Onset  . Breast cancer Sister   . Hypertension Sister   . Heart failure Mother   . Heart disease Father   . Coronary artery disease Other        F, brothers x 2other family members  .  Prostate cancer Brother   . Colon cancer Neg Hx   . Diabetes Neg Hx      Review of Systems: All other systems reviewed and are otherwise negative except as noted above.   Physical Exam: VS:  BP 136/70   Pulse 95   Ht 5\' 5"  (1.651 m)   Wt 121 lb 9.6 oz (55.2 kg)   SpO2 99%   BMI 20.24 kg/m  , BMI Body mass index is 20.24 kg/m.  GEN- The patient is elderly appearing, alert and oriented x 3 today.   HEENT: normocephalic, atraumatic; sclera clear, conjunctiva pink; hearing intact; oropharynx clear; neck supple  Lungs- Clear to ausculation bilaterally, normal work of breathing.  No wheezes, rales, rhonchi Heart- Tachycardic irregular rate and rhythm  GI- soft, non-tender, non-distended, bowel sounds present  Extremities- no clubbing, cyanosis, or edema  MS- no significant deformity or atrophy Skin- warm and dry, no rash or lesion; PPM pocket well healed Psych- euthymic mood, full affect Neuro- strength and sensation are intact  PPM Interrogation- reviewed in detail today,  See PACEART report  EKG:  EKG is not ordered today.  Recent Labs: 04/02/2020: ALT 18; BUN 17; Creatinine, Ser 0.78; Hemoglobin 12.8; Platelets 222.0; Potassium 4.2; Sodium 139; TSH 3.49   Wt Readings from Last 3 Encounters:  08/25/20 121 lb 9.6 oz (55.2 kg)  04/02/20 125 lb 4 oz (56.8 kg)  07/30/19 126 lb (57.2 kg)     Other studies Reviewed: Additional studies/ records that were reviewed today include: Dr [place] and Dr Jackalyn Lombard office notes   Assessment and Plan:  1.  Complete heart block  Normal PPM function Pt is not device dependent today  See Pace Art report No changes today  2.  Paroxysmal atrial fibrillation Burden by device interrogation 100% since end of February - she is asymptomatic V rates are elevated - will add Metoprolol today. She has follow up with Isaac Laud next week.  If heart rates remain elevated, can increase Metoprolol at that time. May need to decrease Losartan.  CHADS2VASC is  4 Continue Eliquis for CHADS2VASC of 4 - dose renally adjusted  3.  HTN Stable No change required today    Current medicines are reviewed at length with the patient today.   The patient does not have concerns regarding her medicines.  The following changes were made today:  As above   Labs/ tests ordered today include:  No orders of the defined types were placed in this encounter.    Disposition:   Follow up with [facility], Dr  Allred 1 year   Signed, Chanetta Marshall, NP 08/25/2020 9:32 AM  [place] [phone] (office) [phone] (fax)

## 2020-09-02 ENCOUNTER — Ambulatory Visit: Payer: Medicare Other | Admitting: Physician Assistant

## 2020-09-02 ENCOUNTER — Other Ambulatory Visit: Payer: Self-pay

## 2020-09-02 ENCOUNTER — Encounter: Payer: Self-pay | Admitting: Physician Assistant

## 2020-09-02 VITALS — BP 110/80 | HR 82 | Ht 65.0 in | Wt 118.2 lb

## 2020-09-02 DIAGNOSIS — E785 Hyperlipidemia, unspecified: Secondary | ICD-10-CM | POA: Diagnosis not present

## 2020-09-02 DIAGNOSIS — I4819 Other persistent atrial fibrillation: Secondary | ICD-10-CM | POA: Diagnosis not present

## 2020-09-02 DIAGNOSIS — Z950 Presence of cardiac pacemaker: Secondary | ICD-10-CM

## 2020-09-02 DIAGNOSIS — I10 Essential (primary) hypertension: Secondary | ICD-10-CM

## 2020-09-02 NOTE — Progress Notes (Signed)
Cardiology Office Note:    Date:  09/04/2020   ID:  Peggy Mendez, DOB Jan 24, 1927, MRN [id number]  PCP:  Colon Branch, MD   [facility] Providers Cardiologist:  Peter Martinique, MD Electrophysiologist:  Thompson Grayer, MD {  Referring MD: Colon Branch, MD   Chief Complaint  Patient presents with  . Follow-up    Seen for Dr. Martinique    History of Present Illness:    Peggy Mendez is a 87 y.o. female with a hx of HTN, HLD, history of syncope, PAF on Eliquis, sick sinus syndrome s/p PPM.  She initially had a Medtronic dual-chamber pacemaker placed in 2008 for complete heart block.  She underwent generator change out in August 2017.  She was noted to be in atrial fibrillation on previous pacemaker interrogation and was placed on Eliquis.  She has a history of statin intolerance and has been placed on Zetia.  Although her previous A. fib burden was low, however based on the most recent interrogation, she has been staying in atrial fibrillation 100% of the time since the end of February 2022.  Patient has been asymptomatic. During the most recent EP visit on 08/25/2020, she was placed on metoprolol tartrate 12.5 mg twice a day.  Patient presents today for follow-up.  She is turning 87 years old tomorrow.  She denies any recent chest pain or shortness of breath.  She denies any recent palpitation or cardiac awareness of atrial fibrillation even though she is still in atrial fibrillation at this time.  Heart rate is irregular.  At this time, we decided to proceed with rate control strategy and leave her in permanent atrial fibrillation given lack of symptom.  Past Medical History:  Diagnosis Date  . Cataract    REMOVED  . History of colonic polyps   . Hyperlipidemia   . Hypertension   . Osteopenia   . Paroxysmal atrial fibrillation (HCC)   . Sick sinus syndrome ([place])   . Skin cancer 2010   Left leg, SCC, sees derm  . Syncope 11/2005    Past Surgical History:  Procedure  Laterality Date  . APPENDECTOMY    . BREAST BIOPSY Bilateral    several, 11/2014 showed fibrocystic changes, no evidence of malignancy  . BREAST BIOPSY Right 11-2015  . cataracts Bilateral   . COLONOSCOPY W/ BIOPSIES    . EP IMPLANTABLE DEVICE N/A 11/27/2015   MDT Adapta L gen change by Dr Rayann Heman  . HEMORRHOID SURGERY    . PPM  2008   MDT dual chamber PPM implanted by Dr Verlon Setting for sick sinus and intermittent CHB  . TOTAL ABDOMINAL HYSTERECTOMY     oophorectomy  . US ECHOCARDIOGRAPHY  12/08/2005   EF 55-60%    Current Medications: Current Meds  Medication Sig  . apixaban (ELIQUIS) 2.5 MG TABS tablet Take 1 tablet (2.5 mg total) by mouth 2 (two) times daily.  . Calcium Carbonate-Vitamin D (CALCIUM 600/VITAMIN D PO) Take 1 capsule by mouth daily.  . cholecalciferol (VITAMIN D) 1000 units tablet Take 2,000 Units by mouth daily.  Marland Kitchen ezetimibe (ZETIA) 10 MG tablet Take 1 tablet (10 mg total) by mouth daily.  Marland Kitchen losartan (COZAAR) 50 MG tablet Take 1 tablet (50 mg total) by mouth daily.  . Misc Natural Products (TART CHERRY ADVANCED) CAPS Take 1 capsule by mouth daily.  . multivitamin (THERAGRAN) per tablet Take 1 tablet by mouth daily.  . Omega-3 Fatty Acids (FISH OIL) 1200 MG CAPS Take one  tablet by mouth daily  . [DISCONTINUED] metoprolol tartrate (LOPRESSOR) 25 MG tablet Take 0.5 tablets (12.5 mg total) by mouth 2 (two) times daily.     Allergies:   Anesthetics, ester; Lisinopril; and Other   Social History   Socioeconomic History  . Marital status: Single    Spouse name: Not on file  . Number of children: 0  . Years of education: Not on file  . Highest education level: Not on file  Occupational History  . Occupation: retired     Fish farm manager: RETIRED  Tobacco Use  . Smoking status: Former Smoker    Types: Cigarettes    Quit date: 08/23/1988    Years since quitting: 32.0  . Smokeless tobacco: Never Used  . Tobacco comment: >20 yrs ago, used to smoke ~ 1 ppd  Vaping Use  . Vaping  Use: Never used  Substance and Sexual Activity  . Alcohol use: Yes    Comment: socially  . Drug use: Yes    Types: IV  . Sexual activity: Not on file  Other Topics Concern  . Not on file  Social History Narrative   Lives by herself, in a condominium,  still drives.   Lost her  twin sister 12/2019   Has a nephew out of state   Brother in Sports coach lives @ [facility]    Social Determinants of Health   Financial Resource Strain: Not on file  Food Insecurity: Not on file  Transportation Needs: Not on file  Physical Activity: Not on file  Stress: Not on file  Social Connections: Not on file     Family History: The patient's family history includes Breast cancer in her sister; Coronary artery disease in an other family member; Heart disease in her father; Heart failure in her mother; Hypertension in her sister; Prostate cancer in her brother. There is no history of Colon cancer or Diabetes.  ROS:   Please see the history of present illness.     All other systems reviewed and are negative.  EKGs/Labs/Other Studies Reviewed:    The following studies were reviewed today:  N/A  EKG:  EKG is ordered today.  The ekg ordered today demonstrates atrial fibrillation with frequent PVCs.  T wave inversion in the lateral leads and inferior leads.  Recent Labs: 04/02/2020: ALT 18; TSH 3.49 08/25/2020: BUN 12; Creatinine, Ser 0.79; Hemoglobin 14.7; Platelets 239; Potassium 4.5; Sodium 138  Recent Lipid Panel    Component Value Date/Time   CHOL 193 04/02/2020 0825   CHOL 220 (H) 12/01/2017 1100   TRIG 58.0 04/02/2020 0825   TRIG 62 06/17/2009 0000   HDL 83.60 04/02/2020 0825   HDL 108 12/01/2017 1100   CHOLHDL 2 04/02/2020 0825   VLDL 11.6 04/02/2020 0825   LDLCALC 98 04/02/2020 0825   LDLCALC 101 (H) 12/01/2017 1100   LDLDIRECT 105.8 09/07/2012 0843     Risk Assessment/Calculations:       Physical Exam:    VS:  BP 110/80   Pulse 82   Ht 5\' 5"  (1.651 m)   Wt 118 lb 3.2 oz  (53.6 kg)   SpO2 96%   BMI 19.67 kg/m     Wt Readings from Last 3 Encounters:  09/02/20 118 lb 3.2 oz (53.6 kg)  08/25/20 121 lb 9.6 oz (55.2 kg)  04/02/20 125 lb 4 oz (56.8 kg)     GEN:  Well nourished, well developed in no acute distress HEENT: Normal NECK: No JVD; No carotid bruits LYMPHATICS:  No lymphadenopathy CARDIAC: RRR, no murmurs, rubs, gallops RESPIRATORY:  Clear to auscultation without rales, wheezing or rhonchi  ABDOMEN: Soft, non-tender, non-distended MUSCULOSKELETAL:  No edema; No deformity  SKIN: Warm and dry NEUROLOGIC:  Alert and oriented x 3 PSYCHIATRIC:  Normal affect   ASSESSMENT:    1. Persistent atrial fibrillation ([place])   2. Primary hypertension   3. Hyperlipidemia LDL goal <100   4. Pacemaker    PLAN:    In order of problems listed above:  1. Persistent atrial fibrillation: Based on the recent device interrogation, she has been in 100% atrial fibrillation since earlier this year.  She has no cardiac awareness of atrial fibrillation, heart rate is very well controlled.  Given her advanced age, cardioversion is unlikely to be beneficial.  We will continue with rate control strategy.  2. Hypertension: Blood pressure stable  3. Hyperlipidemia: Continue Zetia and fish oil  4. History of pacemaker: Followed by EP service.   Medication Adjustments/Labs and Tests Ordered: Current medicines are reviewed at length with the patient today.  Concerns regarding medicines are outlined above.  Orders Placed This Encounter  Procedures  . EKG 12-Lead   No orders of the defined types were placed in this encounter.   Patient Instructions  Medication Instructions:  Your physician recommends that you continue on your current medications as directed. Please refer to the Current Medication list given to you today.  *If you need a refill on your cardiac medications before your next appointment, please call your pharmacy*  Lab Work: NONE ordered at this time  of appointment   If you have labs (blood work) drawn today and your tests are completely normal, you will receive your results only by: Marland Kitchen MyChart Message (if you have MyChart) OR . A paper copy in the mail If you have any lab test that is abnormal or we need to change your treatment, we will call you to review the results.  Testing/Procedures: NONE ordered at this time of appointment   Follow-Up: At [facility], you and your health needs are our priority.  As part of our continuing mission to provide you with exceptional heart care, we have created designated Provider Care Teams.  These Care Teams include your primary Cardiologist (physician) and Advanced Practice Providers (APPs -  Physician Assistants and Nurse Practitioners) who all work together to provide you with the care you need, when you need it.  Your next appointment:   6 month(s)  The format for your next appointment:   In Person  Provider:   Peter Martinique, MD  Other Instructions      Signed, Almyra Deforest, [place]  09/04/2020 3:40 PM    [employer]

## 2020-09-02 NOTE — Patient Instructions (Signed)

## 2020-09-04 ENCOUNTER — Encounter: Payer: Self-pay | Admitting: Physician Assistant

## 2020-09-04 ENCOUNTER — Telehealth: Payer: Self-pay | Admitting: Cardiology

## 2020-09-04 DIAGNOSIS — I480 Paroxysmal atrial fibrillation: Secondary | ICD-10-CM

## 2020-09-04 MED ORDER — METOPROLOL TARTRATE 25 MG PO TABS: 12.5000 mg | ORAL_TABLET | Freq: Two times a day (BID) | ORAL | 3 refills | Status: DC

## 2020-09-04 NOTE — Telephone Encounter (Signed)
Spoke to patient she stated she needs 90 day refill sent to [facility] for Metoprolol.Refill sent to pharmacy.

## 2020-09-04 NOTE — Telephone Encounter (Signed)
PT IS HAVING A MIX UP W/ HER RXS AND WOULD LIKE TO SPEAK W/ A NURSE TO HAVE THEM TO FIX IT SO THAT ALL MEDS ARE COMING FROM ONE OFFICE

## 2020-09-11 NOTE — Progress Notes (Signed)
Remote pacemaker transmission.   

## 2020-11-18 ENCOUNTER — Ambulatory Visit (INDEPENDENT_AMBULATORY_CARE_PROVIDER_SITE_OTHER): Payer: Medicare Other

## 2020-11-18 DIAGNOSIS — I441 Atrioventricular block, second degree: Secondary | ICD-10-CM

## 2020-11-18 LAB — CUP PACEART REMOTE DEVICE CHECK
Battery Impedance: 257 Ohm
Battery Remaining Longevity: 106 mo
Battery Voltage: 2.79 V
Brady Statistic AP VP Percent: 1 %
Brady Statistic AP VS Percent: 0 %
Brady Statistic AS VP Percent: 1 %
Brady Statistic AS VS Percent: 98 %
Date Time Interrogation Session: 20220817093056
Implantable Lead Implant Date: 20080318
Implantable Lead Implant Date: 20080318
Implantable Lead Location: 753859
Implantable Lead Location: 753860
Implantable Lead Model: 5076
Implantable Lead Model: 5076
Implantable Pulse Generator Implant Date: 20170825
Lead Channel Impedance Value: 440 Ohm
Lead Channel Impedance Value: 535 Ohm
Lead Channel Pacing Threshold Amplitude: 0.375 V
Lead Channel Pacing Threshold Amplitude: 0.75 V
Lead Channel Pacing Threshold Pulse Width: 0.4 ms
Lead Channel Pacing Threshold Pulse Width: 0.4 ms
Lead Channel Setting Pacing Amplitude: 2 V
Lead Channel Setting Pacing Amplitude: 2.5 V
Lead Channel Setting Pacing Pulse Width: 0.4 ms
Lead Channel Setting Sensing Sensitivity: 5.6 mV

## 2020-12-08 NOTE — Progress Notes (Signed)
Remote pacemaker transmission.   

## 2021-02-17 ENCOUNTER — Ambulatory Visit (INDEPENDENT_AMBULATORY_CARE_PROVIDER_SITE_OTHER): Payer: Medicare Other

## 2021-02-17 DIAGNOSIS — I441 Atrioventricular block, second degree: Secondary | ICD-10-CM | POA: Diagnosis not present

## 2021-02-17 LAB — CUP PACEART REMOTE DEVICE CHECK
Battery Impedance: 281 Ohm
Battery Remaining Longevity: 101 mo
Battery Voltage: 2.79 V
Brady Statistic AP VP Percent: 1 %
Brady Statistic AP VS Percent: 0 %
Brady Statistic AS VP Percent: 1 %
Brady Statistic AS VS Percent: 98 %
Date Time Interrogation Session: 20221116093850
Implantable Lead Implant Date: 20080318
Implantable Lead Implant Date: 20080318
Implantable Lead Location: 753859
Implantable Lead Location: 753860
Implantable Lead Model: 5076
Implantable Lead Model: 5076
Implantable Pulse Generator Implant Date: 20170825
Lead Channel Impedance Value: 417 Ohm
Lead Channel Impedance Value: 491 Ohm
Lead Channel Pacing Threshold Amplitude: 0.375 V
Lead Channel Pacing Threshold Amplitude: 0.75 V
Lead Channel Pacing Threshold Pulse Width: 0.4 ms
Lead Channel Pacing Threshold Pulse Width: 0.4 ms
Lead Channel Setting Pacing Amplitude: 2 V
Lead Channel Setting Pacing Amplitude: 2.5 V
Lead Channel Setting Pacing Pulse Width: 0.4 ms
Lead Channel Setting Sensing Sensitivity: 5.6 mV

## 2021-02-23 NOTE — Progress Notes (Signed)
Cardiology Office Note:    Date:  03/01/2021   ID:  Peggy Mendez, DOB 1926/04/10, MRN [id number]  PCP:  Colon Branch, MD   [facility] Providers Cardiologist:  Timon Geissinger Martinique, MD Electrophysiologist:  Thompson Grayer, MD {  Referring MD: Colon Branch, MD   Chief Complaint  Patient presents with   Atrial Fibrillation     History of Present Illness:    Peggy Mendez is a 87 y.o. female with a hx of HTN, HLD, history of syncope, PAF on Eliquis, sick sinus syndrome s/p PPM.  She initially had a Medtronic dual-chamber pacemaker placed in 2008 for complete heart block.  She underwent generator change out in August 2017.  She was noted to be in atrial fibrillation on previous pacemaker interrogation and was placed on Eliquis.  She has a history of statin intolerance and has been placed on Zetia.  Although her previous A. fib burden was low, however based on the most recent interrogation, she has been staying in atrial fibrillation 100% of the time since the end of February 2022.  Patient has been asymptomatic. During the most recent EP visit on 08/25/2020, she was placed on metoprolol tartrate 12.5 mg twice a day.  Patient presents today for follow-up.  She is doing well from a cardiac standpoint. No chest pain or dyspnea. Rare palpitations. No dizziness. Is limited by arthritis in her back and knees. Had recent Moh's surgeries for SSCA on her legs[place]   Past Medical History:  Diagnosis Date   Cataract    REMOVED   History of colonic polyps    Hyperlipidemia    Hypertension    Osteopenia    Paroxysmal atrial fibrillation (HCC)    Sick sinus syndrome (HCC)    Skin cancer 2010   Left leg, SCC, sees derm   Syncope 11/2005    Past Surgical History:  Procedure Laterality Date   APPENDECTOMY     BREAST BIOPSY Bilateral    several, 11/2014 showed fibrocystic changes, no evidence of malignancy   BREAST BIOPSY Right 11-2015    cataracts Bilateral    COLONOSCOPY W/ BIOPSIES     EP IMPLANTABLE DEVICE N/A 11/27/2015   MDT Adapta L gen change by Dr Rayann Heman   HEMORRHOID SURGERY     PPM  2008   MDT dual chamber PPM implanted by Dr Verlon Setting for sick sinus and intermittent CHB   TOTAL ABDOMINAL HYSTERECTOMY     oophorectomy   US ECHOCARDIOGRAPHY  12/08/2005   EF 55-60%    Current Medications: Current Meds  Medication Sig   apixaban (ELIQUIS) 2.5 MG TABS tablet Take 1 tablet (2.5 mg total) by mouth 2 (two) times daily.   Calcium Carbonate-Vitamin D (CALCIUM 600/VITAMIN D PO) Take 1 capsule by mouth daily.   cholecalciferol (VITAMIN D) 1000 units tablet Take 2,000 Units by mouth daily.   ezetimibe (ZETIA) 10 MG tablet Take 1 tablet (10 mg total) by mouth daily.   losartan (COZAAR) 50 MG tablet Take 1 tablet (50 mg total) by mouth daily.   metoprolol tartrate (LOPRESSOR) 25 MG tablet Take 0.5 tablets (12.5 mg total) by mouth 2 (two) times daily.   Misc Natural Products (TART CHERRY ADVANCED) CAPS Take 1 capsule by mouth daily.   multivitamin (THERAGRAN) per tablet Take 1 tablet by mouth daily.   Omega-3 Fatty Acids (FISH OIL) 1200 MG CAPS Take one tablet by mouth daily  Allergies:   Anesthetics, ester; Lisinopril; and Other   Social History   Socioeconomic History   Marital status: Single    Spouse name: Not on file   Number of children: 0   Years of education: Not on file   Highest education level: Not on file  Occupational History   Occupation: retired     Fish farm manager: RETIRED  Tobacco Use   Smoking status: Former    Types: Cigarettes    Quit date: 08/23/1988    Years since quitting: 32.5   Smokeless tobacco: Never   Tobacco comments:    >20 yrs ago, used to smoke ~ 1 ppd  Vaping Use   Vaping Use: Never used  Substance and Sexual Activity   Alcohol use: Yes    Comment: socially   Drug use: Yes    Types: IV   Sexual activity: Not on file  Other Topics Concern   Not on file  Social History  Narrative   Lives by herself, in a condominium,  still drives.   Lost her  twin sister 12/2019   Has a nephew out of state   Brother in Sports coach lives @ [facility]    Social Determinants of Health   Financial Resource Strain: Not on file  Food Insecurity: Not on file  Transportation Needs: Not on file  Physical Activity: Not on file  Stress: Not on file  Social Connections: Not on file     Family History: The patient's family history includes Breast cancer in her sister; Coronary artery disease in an other family member; Heart disease in her father; Heart failure in her mother; Hypertension in her sister; Prostate cancer in her brother. There is no history of Colon cancer or Diabetes.  ROS:   Please see the history of present illness.     All other systems reviewed and are negative.  EKGs/Labs/Other Studies Reviewed:    The following studies were reviewed today:  N/A  EKG:  EKG is not ordered today.    Recent Labs: 04/02/2020: ALT 18; TSH 3.49 08/25/2020: BUN 12; Creatinine, Ser 0.79; Hemoglobin 14.7; Platelets 239; Potassium 4.5; Sodium 138  Recent Lipid Panel    Component Value Date/Time   CHOL 193 04/02/2020 0825   CHOL 220 (H) 12/01/2017 1100   TRIG 58.0 04/02/2020 0825   TRIG 62 06/17/2009 0000   HDL 83.60 04/02/2020 0825   HDL 108 12/01/2017 1100   CHOLHDL 2 04/02/2020 0825   VLDL 11.6 04/02/2020 0825   LDLCALC 98 04/02/2020 0825   LDLCALC 101 (H) 12/01/2017 1100   LDLDIRECT 105.8 09/07/2012 0843     Risk Assessment/Calculations:       Physical Exam:    VS:  BP 136/64   Pulse 67   Ht 5' (1.524 m)   Wt 120 lb 6.4 oz (54.6 kg)   SpO2 97%   BMI 23.51 kg/m     Wt Readings from Last 3 Encounters:  03/01/21 120 lb 6.4 oz (54.6 kg)  09/02/20 118 lb 3.2 oz (53.6 kg)  08/25/20 121 lb 9.6 oz (55.2 kg)     GEN:  Well nourished, well developed in no acute distress HEENT: Normal NECK: No JVD; No carotid bruits LYMPHATICS: No lymphadenopathy CARDIAC: RRR,  no murmurs, rubs, gallops RESPIRATORY:  Clear to auscultation without rales, wheezing or rhonchi  ABDOMEN: Soft, non-tender, non-distended MUSCULOSKELETAL:  No edema; No deformity  SKIN: Warm and dry NEUROLOGIC:  Alert and oriented x 3 PSYCHIATRIC:  Normal affect   ASSESSMENT:  1. AV block, 2nd degree   2. Persistent atrial fibrillation ([place])   3. Primary hypertension     PLAN:    In order of problems listed above:  Persistent atrial fibrillation: Based on the recent device interrogation, she has been in 100% atrial fibrillation since earlier this year.  She has no cardiac awareness of atrial fibrillation, heart rate is very well controlled.   We will continue with rate control strategy.  Hypertension: Blood pressure stable  Hyperlipidemia: Continue Zetia and fish oil  History of pacemaker: Followed by EP service.- recent pacer check on 02/17/21 was normal.  Follow  up in 6 months  Medication Adjustments/Labs and Tests Ordered: Current medicines are reviewed at length with the patient today.  Concerns regarding medicines are outlined above.  No orders of the defined types were placed in this encounter.  No orders of the defined types were placed in this encounter.   There are no Patient Instructions on file for this visit.   Signed, Preethi Scantlebury Martinique, MD  03/01/2021 8:29 AM    [facility]

## 2021-02-24 NOTE — Progress Notes (Signed)
Remote pacemaker transmission.   

## 2021-03-01 ENCOUNTER — Ambulatory Visit: Payer: Medicare Other | Admitting: Cardiology

## 2021-03-01 ENCOUNTER — Other Ambulatory Visit: Payer: Self-pay

## 2021-03-01 ENCOUNTER — Encounter: Payer: Self-pay | Admitting: Cardiology

## 2021-03-01 VITALS — BP 136/64 | HR 67 | Ht 60.0 in | Wt 120.4 lb

## 2021-03-01 DIAGNOSIS — I4819 Other persistent atrial fibrillation: Secondary | ICD-10-CM | POA: Diagnosis not present

## 2021-03-01 DIAGNOSIS — I441 Atrioventricular block, second degree: Secondary | ICD-10-CM

## 2021-03-01 DIAGNOSIS — I10 Essential (primary) hypertension: Secondary | ICD-10-CM | POA: Diagnosis not present

## 2021-04-02 ENCOUNTER — Ambulatory Visit (INDEPENDENT_AMBULATORY_CARE_PROVIDER_SITE_OTHER): Payer: Medicare Other | Admitting: Internal Medicine

## 2021-04-02 ENCOUNTER — Encounter: Payer: Self-pay | Admitting: Internal Medicine

## 2021-04-02 VITALS — BP 116/70 | HR 74 | Temp 97.8°F | Resp 16 | Ht 60.0 in | Wt 120.5 lb

## 2021-04-02 DIAGNOSIS — Z0000 Encounter for general adult medical examination without abnormal findings: Secondary | ICD-10-CM | POA: Diagnosis not present

## 2021-04-02 DIAGNOSIS — E7800 Pure hypercholesterolemia, unspecified: Secondary | ICD-10-CM | POA: Diagnosis not present

## 2021-04-02 DIAGNOSIS — I10 Essential (primary) hypertension: Secondary | ICD-10-CM

## 2021-04-02 LAB — CBC WITH DIFFERENTIAL/PLATELET
Basophils Absolute: 0 10*3/uL (ref 0.0–0.1)
Basophils Relative: 0.7 % (ref 0.0–3.0)
Eosinophils Absolute: 0.1 10*3/uL (ref 0.0–0.7)
Eosinophils Relative: 1 % (ref 0.0–5.0)
HCT: 42.8 % (ref 36.0–46.0)
Hemoglobin: 13.9 g/dL (ref 12.0–15.0)
Lymphocytes Relative: 24 % (ref 12.0–46.0)
Lymphs Abs: 1.6 10*3/uL (ref 0.7–4.0)
MCHC: 32.4 g/dL (ref 30.0–36.0)
MCV: 86.6 fl (ref 78.0–100.0)
Monocytes Absolute: 0.6 10*3/uL (ref 0.1–1.0)
Monocytes Relative: 9.9 % (ref 3.0–12.0)
Neutro Abs: 4.2 10*3/uL (ref 1.4–7.7)
Neutrophils Relative %: 64.4 % (ref 43.0–77.0)
Platelets: 218 10*3/uL (ref 150.0–400.0)
RBC: 4.94 Mil/uL (ref 3.87–5.11)
RDW: 13.9 % (ref 11.5–15.5)
WBC: 6.5 10*3/uL (ref 4.0–10.5)

## 2021-04-02 LAB — COMPREHENSIVE METABOLIC PANEL
ALT: 19 U/L (ref 0–35)
AST: 26 U/L (ref 0–37)
Albumin: 4 g/dL (ref 3.5–5.2)
Alkaline Phosphatase: 67 U/L (ref 39–117)
BUN: 14 mg/dL (ref 6–23)
CO2: 29 mEq/L (ref 19–32)
Calcium: 9.5 mg/dL (ref 8.4–10.5)
Chloride: 101 mEq/L (ref 96–112)
Creatinine, Ser: 0.77 mg/dL (ref 0.40–1.20)
GFR: 66.89 mL/min (ref >60.00)
Glucose, Bld: 87 mg/dL (ref 70–99)
Potassium: 4.8 mEq/L (ref 3.5–5.1)
Sodium: 137 mEq/L (ref 135–145)
Total Bilirubin: 1.1 mg/dL (ref 0.2–1.2)
Total Protein: 6.7 g/dL (ref 6.0–8.3)

## 2021-04-02 LAB — LIPID PANEL
Cholesterol: 177 mg/dL (ref 0–200)
HDL: 77.2 mg/dL (ref >39.00)
LDL Cholesterol: 88 mg/dL (ref 0–99)
NonHDL: 100.22
Total CHOL/HDL Ratio: 2
Triglycerides: 63 mg/dL (ref 0.0–149.0)
VLDL: 12.6 mg/dL (ref 0.0–40.0)

## 2021-04-02 NOTE — Progress Notes (Signed)
Subjective:    Patient ID: Peggy Mendez, female    DOB: 02/04/1927, 87 y.o.   MRN: [id number]  DOS:  04/02/2021 Type of visit - description: CPX  Since the last office visit is feeling well in general. Knee DJD is bothering her and limiting her mobility. Had a couple of procedures for skin cancer in the legs, they are healing very slowly and that bothers her.  Review of Systems  Other than above, a 14 point review of systems is negative     Past Medical History:  Diagnosis Date   Cataract    REMOVED   History of colonic polyps    Hyperlipidemia    Hypertension    Osteopenia    Paroxysmal atrial fibrillation (HCC)    Sick sinus syndrome (HCC)    Skin cancer 2010   Left leg, SCC, sees derm   Syncope 11/2005    Past Surgical History:  Procedure Laterality Date   APPENDECTOMY     BREAST BIOPSY Bilateral    several, 11/2014 showed fibrocystic changes, no evidence of malignancy   BREAST BIOPSY Right 11-2015   cataracts Bilateral    COLONOSCOPY W/ BIOPSIES     EP IMPLANTABLE DEVICE N/A 11/27/2015   MDT Adapta L gen change by Dr Rayann Heman   HEMORRHOID SURGERY     PPM  2008   MDT dual chamber PPM implanted by Dr Verlon Setting for sick sinus and intermittent CHB   TOTAL ABDOMINAL HYSTERECTOMY     oophorectomy   US ECHOCARDIOGRAPHY  12/08/2005   EF 55-60%   Social History   Socioeconomic History   Marital status: Single    Spouse name: Not on file   Number of children: 0   Years of education: Not on file   Highest education level: Not on file  Occupational History   Occupation: retired     Fish farm manager: RETIRED  Tobacco Use   Smoking status: Former    Types: Cigarettes    Quit date: 08/23/1988    Years since quitting: 32.6   Smokeless tobacco: Never   Tobacco comments:    >20 yrs ago, used to smoke ~ 1 ppd  Vaping Use   Vaping Use: Never used  Substance and Sexual Activity   Alcohol use: Yes    Comment: socially   Drug use: Yes    Types: IV   Sexual activity: Not  on file  Other Topics Concern   Not on file  Social History Narrative   Lives by herself, in a condominium,  still drives.   Lost her  twin sister 12/2019   Has  extended family members checking on her   Brother in law passed 11-2020       Social Determinants of Health   Financial Resource Strain: Not on file  Food Insecurity: Not on file  Transportation Needs: Not on file  Physical Activity: Not on file  Stress: Not on file  Social Connections: Not on file  Intimate Partner Violence: Not on file    Allergies as of 04/02/2021       Reactions   Anesthetics, Ester    Anesthetics not particularly Ester - nausea   Lisinopril Cough   Other Dermatitis, Rash   Tegaderm (clear occlusive dressing) Pt was miserable        Medication List        Accurate as of April 02, 2021  4:58 PM. If you have any questions, ask your nurse or doctor.  apixaban 2.5 MG Tabs tablet Commonly known as: Eliquis Take 1 tablet (2.5 mg total) by mouth 2 (two) times daily.   CALCIUM 600/VITAMIN D PO Take 1 capsule by mouth daily.   cholecalciferol 1000 units tablet Commonly known as: VITAMIN D Take 2,000 Units by mouth daily.   ezetimibe 10 MG tablet Commonly known as: ZETIA Take 1 tablet (10 mg total) by mouth daily.   Fish Oil 1200 MG Caps Take one tablet by mouth daily   losartan 50 MG tablet Commonly known as: COZAAR Take 1 tablet (50 mg total) by mouth daily.   metoprolol tartrate 25 MG tablet Commonly known as: LOPRESSOR Take 0.5 tablets (12.5 mg total) by mouth 2 (two) times daily.   multivitamin per tablet Take 1 tablet by mouth daily.   Tart Cherry Advanced Caps Take 1 capsule by mouth daily.           Objective:   Physical Exam BP 116/70 (BP Location: Left Arm, Patient Position: Sitting, Cuff Size: Small)    Pulse 74    Temp 97.8 F (36.6 C) (Oral)    Resp 16    Ht 5' (1.524 m)    Wt 120 lb 8 oz (54.7 kg)    SpO2 96%    BMI 23.53 kg/m   General: Well developed, NAD, BMI noted Neck: No  thyromegaly  HEENT:  Normocephalic . Face symmetric, atraumatic Lungs:  CTA B Normal respiratory effort, no intercostal retractions, no accessory muscle use. Heart: Irregularly irregular, no murmur Abdomen:  Not distended, soft, non-tender. No rebound or rigidity.   Lower extremities: no pretibial edema bilaterally  Skin: Exposed areas without rash. Not pale. Not jaundice Neurologic:  alert & oriented X3.  Speech normal, gait appropriate for age and unassisted.  Needed help transferring to the table Strength symmetric and appropriate for age.  Psych: Cognition and judgment appear intact.  Cooperative with normal attention span and concentration.  Behavior appropriate. No anxious or depressed appearing.     Assessment    Assessment  HTN Hyperlipidemia CV: --Paroxysmal A. Fib >> ELIQUIS --Pacemaker for Mobitz 2 --Syncope 2007, ECHO aortic sclerosis, saw cardiology Osteopenia DEA 08-2006 and 10-2008 -----T score at the spine normal. DEXA 02-2011 T score of the hips was -2.0, -2.1, mild osteopenia. Declined rx as of 02/2018  DJD  Skin cancer, SCC, left leg, sees dermatology FH breast ca -sister, brother CHF  PLAN Here for CPX HTN: BP is very good, on losartan, metoprolol.  Labs AV block, second-degree.  A. fib, pacemaker. Saw cardiology 03/01/2021, 100% time on  A. fib, asymptomatic.  No changes made. Skin cancers: Managed elsewhere DJD: Mostly on her knees, that is limiting her mobility.  Is needing help transferring. Social: Still living independently, drives.  She is planning on moving to a ALF. RTC 8 months     This visit occurred during the SARS-CoV-2 public health emergency.  Safety protocols were in place, including screening questions prior to the visit, additional usage of staff PPE, and extensive cleaning of exam room while observing appropriate contact time as indicated for disinfecting solutions.

## 2021-04-02 NOTE — Patient Instructions (Signed)
Recommend to bring a copy of your healthcare power of attorney to be scanned in your chart   GO TO THE LAB : Get the blood work     [place], [place] back for   checkup in 8 months

## 2021-04-02 NOTE — Assessment & Plan Note (Signed)
Here for CPX HTN: BP is very good, on losartan, metoprolol.  Labs AV block, second-degree.  A. fib, pacemaker. Saw cardiology 03/01/2021, 100% time on  A. fib, asymptomatic.  No changes made. Skin cancers: Managed elsewhere DJD: Mostly on her knees, that is limiting her mobility.  Is needing help transferring. Social: Still living independently, drives.  She is planning on moving to a ALF. RTC 8 months

## 2021-04-02 NOTE — Assessment & Plan Note (Signed)
-  Td  2016 -PNM 23: 2005, 02-2017 - Prevnar: 2015 - s/p shingles and  Shingrix   per pt  -COVID vax:  UTD Had a flu shot -no further screening for cervical ca -h/o abnormal MMG and breast Bx; breast bx neg 11-2014;  MMG 06-2020 ([facility]) -CCS: Cscope 8-09, 2 polyps;  Cscope 8-14, + polyps, no further scopes  -Abd u/s 2013 (-) for AAA  -Labs: CMP, FLP, CBC, -Advance directives: Recommend to bring a copy

## 2021-04-04 ENCOUNTER — Other Ambulatory Visit: Payer: Self-pay | Admitting: Cardiology

## 2021-04-04 DIAGNOSIS — I480 Paroxysmal atrial fibrillation: Secondary | ICD-10-CM

## 2021-04-06 ENCOUNTER — Other Ambulatory Visit: Payer: Self-pay

## 2021-04-06 DIAGNOSIS — I480 Paroxysmal atrial fibrillation: Secondary | ICD-10-CM

## 2021-04-06 MED ORDER — APIXABAN 2.5 MG PO TABS: 2.5000 mg | ORAL_TABLET | Freq: Two times a day (BID) | ORAL | 1 refills | Status: DC

## 2021-04-06 NOTE — Telephone Encounter (Signed)
Prescription refill request for Eliquis received. Indication:afib Last office visit:peter Martinique md 03/01/21 Scr:0.77 04/02/21 Age: 73f Weight:54.7kg

## 2021-05-14 ENCOUNTER — Other Ambulatory Visit: Payer: Self-pay | Admitting: Internal Medicine

## 2021-05-14 DIAGNOSIS — Z1231 Encounter for screening mammogram for malignant neoplasm of breast: Secondary | ICD-10-CM

## 2021-05-19 ENCOUNTER — Ambulatory Visit (INDEPENDENT_AMBULATORY_CARE_PROVIDER_SITE_OTHER): Payer: Medicare Other

## 2021-05-19 DIAGNOSIS — I441 Atrioventricular block, second degree: Secondary | ICD-10-CM

## 2021-05-19 LAB — CUP PACEART REMOTE DEVICE CHECK
Battery Impedance: 305 Ohm
Battery Remaining Longevity: 99 mo
Battery Voltage: 2.79 V
Brady Statistic AP VP Percent: 1 %
Brady Statistic AP VS Percent: 0 %
Brady Statistic AS VP Percent: 1 %
Brady Statistic AS VS Percent: 98 %
Date Time Interrogation Session: 20230215103848
Implantable Lead Implant Date: 20080318
Implantable Lead Implant Date: 20080318
Implantable Lead Location: 753859
Implantable Lead Location: 753860
Implantable Lead Model: 5076
Implantable Lead Model: 5076
Implantable Pulse Generator Implant Date: 20170825
Lead Channel Impedance Value: 417 Ohm
Lead Channel Impedance Value: 488 Ohm
Lead Channel Pacing Threshold Amplitude: 0.375 V
Lead Channel Pacing Threshold Amplitude: 0.75 V
Lead Channel Pacing Threshold Pulse Width: 0.4 ms
Lead Channel Pacing Threshold Pulse Width: 0.4 ms
Lead Channel Setting Pacing Amplitude: 2 V
Lead Channel Setting Pacing Amplitude: 2.5 V
Lead Channel Setting Pacing Pulse Width: 0.4 ms
Lead Channel Setting Sensing Sensitivity: 5.6 mV

## 2021-05-25 NOTE — Progress Notes (Signed)
Remote pacemaker transmission.   

## 2021-06-28 ENCOUNTER — Ambulatory Visit: Payer: Medicare Other

## 2021-07-06 ENCOUNTER — Ambulatory Visit
Admission: RE | Admit: 2021-07-06 | Discharge: 2021-07-06 | Disposition: A | Discharge status: Home / Self Care | Payer: Medicare Other | Source: Ambulatory Visit | Attending: Internal Medicine | Admitting: Internal Medicine

## 2021-07-06 DIAGNOSIS — Z1231 Encounter for screening mammogram for malignant neoplasm of breast: Secondary | ICD-10-CM

## 2021-07-08 ENCOUNTER — Other Ambulatory Visit: Payer: Self-pay

## 2021-07-08 ENCOUNTER — Other Ambulatory Visit: Payer: Self-pay | Admitting: Internal Medicine

## 2021-07-08 DIAGNOSIS — R928 Other abnormal and inconclusive findings on diagnostic imaging of breast: Secondary | ICD-10-CM

## 2021-07-08 MED ORDER — METOPROLOL TARTRATE 25 MG PO TABS: 12.5000 mg | ORAL_TABLET | Freq: Two times a day (BID) | ORAL | 2 refills | Status: DC

## 2021-07-16 ENCOUNTER — Other Ambulatory Visit: Payer: Self-pay | Admitting: Internal Medicine

## 2021-07-16 ENCOUNTER — Ambulatory Visit
Admission: RE | Admit: 2021-07-16 | Discharge: 2021-07-16 | Disposition: A | Discharge status: Home / Self Care | Payer: Medicare Other | Source: Ambulatory Visit | Attending: Internal Medicine | Admitting: Internal Medicine

## 2021-07-16 DIAGNOSIS — R928 Other abnormal and inconclusive findings on diagnostic imaging of breast: Secondary | ICD-10-CM

## 2021-07-16 DIAGNOSIS — R921 Mammographic calcification found on diagnostic imaging of breast: Secondary | ICD-10-CM

## 2021-07-27 ENCOUNTER — Ambulatory Visit
Admission: RE | Admit: 2021-07-27 | Discharge: 2021-07-27 | Disposition: A | Discharge status: Home / Self Care | Payer: Medicare Other | Source: Ambulatory Visit | Attending: Internal Medicine | Admitting: Internal Medicine

## 2021-07-27 DIAGNOSIS — R921 Mammographic calcification found on diagnostic imaging of breast: Secondary | ICD-10-CM

## 2021-08-18 ENCOUNTER — Ambulatory Visit (INDEPENDENT_AMBULATORY_CARE_PROVIDER_SITE_OTHER): Payer: Medicare Other

## 2021-08-18 DIAGNOSIS — I441 Atrioventricular block, second degree: Secondary | ICD-10-CM

## 2021-08-20 LAB — CUP PACEART REMOTE DEVICE CHECK
Battery Impedance: 329 Ohm
Battery Remaining Longevity: 96 mo
Battery Voltage: 2.79 V
Brady Statistic AP VP Percent: 1 %
Brady Statistic AP VS Percent: 0 %
Brady Statistic AS VP Percent: 1 %
Brady Statistic AS VS Percent: 98 %
Date Time Interrogation Session: 20230517094611
Implantable Lead Implant Date: 20080318
Implantable Lead Implant Date: 20080318
Implantable Lead Location: 753859
Implantable Lead Location: 753860
Implantable Lead Model: 5076
Implantable Lead Model: 5076
Implantable Pulse Generator Implant Date: 20170825
Lead Channel Impedance Value: 422 Ohm
Lead Channel Impedance Value: 498 Ohm
Lead Channel Pacing Threshold Amplitude: 0.375 V
Lead Channel Pacing Threshold Amplitude: 0.75 V
Lead Channel Pacing Threshold Pulse Width: 0.4 ms
Lead Channel Pacing Threshold Pulse Width: 0.4 ms
Lead Channel Setting Pacing Amplitude: 2 V
Lead Channel Setting Pacing Amplitude: 2.5 V
Lead Channel Setting Pacing Pulse Width: 0.4 ms
Lead Channel Setting Sensing Sensitivity: 5.6 mV

## 2021-08-31 ENCOUNTER — Encounter: Payer: Self-pay | Admitting: Cardiology

## 2021-08-31 ENCOUNTER — Ambulatory Visit: Payer: Medicare Other | Admitting: Cardiology

## 2021-08-31 VITALS — BP 126/76 | HR 85 | Ht 60.0 in | Wt 123.0 lb

## 2021-08-31 DIAGNOSIS — Z950 Presence of cardiac pacemaker: Secondary | ICD-10-CM

## 2021-08-31 DIAGNOSIS — I4821 Permanent atrial fibrillation: Secondary | ICD-10-CM | POA: Diagnosis not present

## 2021-08-31 DIAGNOSIS — I442 Atrioventricular block, complete: Secondary | ICD-10-CM

## 2021-08-31 DIAGNOSIS — I4819 Other persistent atrial fibrillation: Secondary | ICD-10-CM

## 2021-08-31 NOTE — Progress Notes (Signed)
Remotes transferred to Dr. Quentin Ore.

## 2021-08-31 NOTE — Progress Notes (Signed)
Electrophysiology Office Note:    Date:  08/31/2021   ID:  [name], DOB 1926-06-20, MRN [id number]  PCP:  Colon Branch, MD  [facility] Cardiologist:  Peter Martinique, MD  [facility] Electrophysiologist:  Vickie Epley, MD   Referring MD: Colon Branch, MD   Chief Complaint: [facility] MDT follow-up  History of Present Illness:    Peggy Mendez is a 88 y.o. female who presents for follow-up of their PPM MDT device previously followed by Dr. Rayann Heman. Their medical history includes paroxysmal atrial fibrillation, sick sinus syndrome s/p PPM MDT, hypertension, hyperlipidemia, osteopenia, and skin cancer.  A Medtronic dual-chamber pacemaker was placed in 2008 for complete heart block.  She underwent generator change out in August 2017.  She was last seen in cardiology by Dr. Martinique on 03/01/2021. She reported rare palpitations and had recent Moh's surgeries for SSCA on her legs. Per her most recent interrogation at the time, she had been staying in Afib 100% of the time since the end of 05/2020. She denied being symptomatic. She had been started on metoprolol tartrate 12.5 mg twice a day on 08/25/2020.  Overall, she denies any pain or issues regarding her device.   She admits to not being as active as she used to be due to arthritis in her knee. Her most strenuous activities including cleaning her house slowly throughout a week.  She walks with a cane for stability mostly. Occasionally she has issues with vertigo. She is also receiving treatments for skin cancer on her legs.  She denies any palpitations, chest pain, shortness of breath, or peripheral edema. No headaches, syncope, orthopnea, or PND.      Past Medical History:  Diagnosis Date   Cataract    REMOVED   History of colonic polyps    Hyperlipidemia    Hypertension    Osteopenia    Paroxysmal atrial fibrillation (HCC)    Sick sinus syndrome (HCC)    Skin cancer 2010   Left leg, SCC, sees derm   Syncope  11/2005    Past Surgical History:  Procedure Laterality Date   APPENDECTOMY     BREAST BIOPSY Bilateral    several, 11/2014 showed fibrocystic changes, no evidence of malignancy   BREAST BIOPSY Right 11-2015   cataracts Bilateral    COLONOSCOPY W/ BIOPSIES     EP IMPLANTABLE DEVICE N/A 11/27/2015   MDT Adapta L gen change by Dr Rayann Heman   HEMORRHOID SURGERY     PPM  2008   MDT dual chamber PPM implanted by Dr Verlon Setting for sick sinus and intermittent CHB   TOTAL ABDOMINAL HYSTERECTOMY     oophorectomy   US ECHOCARDIOGRAPHY  12/08/2005   EF 55-60%    Current Medications: Current Meds  Medication Sig   apixaban (ELIQUIS) 2.5 MG TABS tablet Take 1 tablet (2.5 mg total) by mouth 2 (two) times daily.   Calcium Carbonate-Vitamin D (CALCIUM 600/VITAMIN D PO) Take 1 capsule by mouth daily.   cholecalciferol (VITAMIN D) 1000 units tablet Take 2,000 Units by mouth daily.   ezetimibe (ZETIA) 10 MG tablet TAKE 1 TABLET BY MOUTH  DAILY   losartan (COZAAR) 50 MG tablet TAKE 1 TABLET BY MOUTH  DAILY   metoprolol tartrate (LOPRESSOR) 25 MG tablet Take 0.5 tablets (12.5 mg total) by mouth 2 (two) times daily.   Misc Natural Products (TART CHERRY ADVANCED) CAPS Take 1 capsule by mouth daily.   multivitamin (THERAGRAN) per tablet Take 1 tablet by mouth  daily.   Omega-3 Fatty Acids (FISH OIL) 1200 MG CAPS Take one tablet by mouth daily     Allergies:   Anesthetics, ester; Lisinopril; and Other   Social History   Socioeconomic History   Marital status: Single    Spouse name: Not on file   Number of children: 0   Years of education: Not on file   Highest education level: Not on file  Occupational History   Occupation: retired     Fish farm manager: RETIRED  Tobacco Use   Smoking status: Former    Types: Cigarettes    Quit date: 08/23/1988    Years since quitting: 33.0   Smokeless tobacco: Never   Tobacco comments:    >20 yrs ago, used to smoke ~ 1 ppd  Vaping Use   Vaping Use: Never used  Substance  and Sexual Activity   Alcohol use: Yes    Comment: socially   Drug use: Yes    Types: IV   Sexual activity: Not on file  Other Topics Concern   Not on file  Social History Narrative   Lives by herself, in a condominium,  still drives.   Lost her  twin sister 12/2019   Has  extended family members checking on her   Brother in law passed 11-2020       Social Determinants of Health   Financial Resource Strain: Not on file  Food Insecurity: Not on file  Transportation Needs: Not on file  Physical Activity: Not on file  Stress: Not on file  Social Connections: Not on file     Family History: The patient's family history includes Breast cancer (age of onset: 43) in her sister; Coronary artery disease in an other family member; Heart disease in her father; Heart failure in her mother; Hypertension in her sister; Prostate cancer in her brother. There is no history of Colon cancer or Diabetes.  ROS:   Please see the history of present illness.    (+) Knee pain (+) Gait instability (+) Vertigo All other systems reviewed and are negative.  EKGs/Labs/Other Studies Reviewed:    The following studies were reviewed today:  08/31/2021  In clinic device interrogation personally reviewed: Reprogrammed device to VVI today given permanent A-fib. Rates are fairly well controlled. Lead parameters stable  11/27/2015  PPM/BIV Generator Changeout CONCLUSIONS:   1. Successful pacemaker pulse generator replacement for elective replacement indicator battery status / h/o Sick sinus syndrome  2. No early apparent complications.   EKG:   EKG is personally reviewed.  08/31/2021: EKG was not ordered.    Recent Labs: 04/02/2021: ALT 19; BUN 14; Creatinine, Ser 0.77; Hemoglobin 13.9; Platelets 218.0; Potassium 4.8; Sodium 137   Recent Lipid Panel    Component Value Date/Time   CHOL 177 04/02/2021 0925   CHOL 220 (H) 12/01/2017 1100   TRIG 63.0 04/02/2021 0925   TRIG 62 06/17/2009 0000   HDL  77.20 04/02/2021 0925   HDL 108 12/01/2017 1100   CHOLHDL 2 04/02/2021 0925   VLDL 12.6 04/02/2021 0925   LDLCALC 88 04/02/2021 0925   LDLCALC 101 (H) 12/01/2017 1100   LDLDIRECT 105.8 09/07/2012 0843    Physical Exam:    VS:  BP 126/76   Pulse 85   Ht 5' (1.524 m)   Wt 123 lb (55.8 kg)   SpO2 98%   BMI 24.02 kg/m     Wt Readings from Last 3 Encounters:  08/31/21 123 lb (55.8 kg)  04/02/21 120 lb 8  oz (54.7 kg)  03/01/21 120 lb 6.4 oz (54.6 kg)     GEN: Well nourished, well developed in no acute distress HEENT: Normal NECK: No JVD; No carotid bruits LYMPHATICS: No lymphadenopathy CARDIAC: Irregularly irregular, no murmurs, rubs, gallops; Device pocket well healed. RESPIRATORY:  Clear to auscultation without rales, wheezing or rhonchi  ABDOMEN: Soft, non-tender, non-distended MUSCULOSKELETAL:  No edema; No deformity  SKIN: Warm and dry NEUROLOGIC:  Alert and oriented x 3 PSYCHIATRIC:  Normal affect       ASSESSMENT:    1. Permanent atrial fibrillation ([place])   2. Heart block AV complete (HCC)   3. Cardiac pacemaker in situ    PLAN:    In order of problems listed above:   #Permanent atrial fibrillation On Eliquis for stroke prophylaxis.    #Complete heart block Pacemaker in situ.  Reprogram pacemaker to VVIR given permanent A-fib.  We will continue remote monitoring.   Total time of encounter: 34 minutes total time of encounter, including face-to-face patient care, coordination of care and counseling regarding high complexity medical decision making.  Follow-up in 1 year.  Medication Adjustments/Labs and Tests Ordered: Current medicines are reviewed at length with the patient today.  Concerns regarding medicines are outlined above.  No orders of the defined types were placed in this encounter.  No orders of the defined types were placed in this encounter.   I,Mathew Stumpf,acting as a Education administrator for Vickie Epley, MD.,have documented all relevant  documentation on the behalf of Vickie Epley, MD,as directed by  Vickie Epley, MD while in the presence of Vickie Epley, MD.  I, Vickie Epley, MD, have reviewed all documentation for this visit. The documentation on 08/31/21 for the exam, diagnosis, procedures, and orders are all accurate and complete.   Signed, Hilton Cork. Quentin Ore, MD, [facility], [facility] 08/31/2021 11:10 AM    Electrophysiology [facility]

## 2021-08-31 NOTE — Progress Notes (Signed)
Remote pacemaker transmission.   

## 2021-08-31 NOTE — Patient Instructions (Signed)
Medication Instructions:  Your physician recommends that you continue on your current medications as directed. Please refer to the Current Medication list given to you today. *If you need a refill on your cardiac medications before your next appointment, please call your pharmacy*  Lab Work: None. If you have labs (blood work) drawn today and your tests are completely normal, you will receive your results only by: MyChart Message (if you have MyChart) OR A paper copy in the mail If you have any lab test that is abnormal or we need to change your treatment, we will call you to review the results.  Testing/Procedures: None.  Follow-Up: At [facility], you and your health needs are our priority.  As part of our continuing mission to provide you with exceptional heart care, we have created designated Provider Care Teams.  These Care Teams include your primary Cardiologist (physician) and Advanced Practice Providers (APPs -  Physician Assistants and Nurse Practitioners) who all work together to provide you with the care you need, when you need it.  Your physician wants you to follow-up in: 12 months with Cameron Lambert, MD     You will receive a reminder letter in the mail two months in advance. If you don't receive a letter, please call our office to schedule the follow-up appointment.  We recommend signing up for the patient portal called "MyChart".  Sign up information is provided on this After Visit Summary.  MyChart is used to connect with patients for Virtual Visits (Telemedicine).  Patients are able to view lab/test results, encounter notes, upcoming appointments, etc.  Non-urgent messages can be sent to your provider as well.   To learn more about what you can do with MyChart, go to [email or web address].    Any Other Special Instructions Will Be Listed Below (If Applicable).         

## 2021-09-06 NOTE — Progress Notes (Signed)
Cardiology Office Note:    Date:  09/09/2021   ID:  [name], DOB December 24, 1926, MRN [id number]  PCP:  Colon Branch, MD   [facility] Providers Cardiologist:  Rosalie Gelpi Martinique, MD Electrophysiologist:  Vickie Epley, MD {  Referring MD: Colon Branch, MD   Chief Complaint  Patient presents with   Atrial Fibrillation     History of Present Illness:    Peggy Mendez is a 88 y.o. female with a hx of HTN, HLD, history of syncope, PAF on Eliquis, sick sinus syndrome s/p PPM.  She initially had a Medtronic dual-chamber pacemaker placed in 2008 for complete heart block.  She underwent generator change out in August 2017.  She was noted to be in atrial fibrillation on previous pacemaker interrogation and was placed on Eliquis.  She has a history of statin intolerance and has been placed on Zetia.  She has been staying in atrial fibrillation 100% of the time since the end of February 2022.  Patient has been asymptomatic. Last pacer check may 17 showed good rate control.   Patient presents today for follow-up.  She is doing well from a cardiac standpoint. No chest pain or dyspnea. No palpitations.  No dizziness. BP has been well controlled.   Past Medical History:  Diagnosis Date   Cataract    REMOVED   History of colonic polyps    Hyperlipidemia    Hypertension    Osteopenia    Paroxysmal atrial fibrillation (HCC)    Sick sinus syndrome (HCC)    Skin cancer 2010   Left leg, SCC, sees derm   Syncope 11/2005    Past Surgical History:  Procedure Laterality Date   APPENDECTOMY     BREAST BIOPSY Bilateral    several, 11/2014 showed fibrocystic changes, no evidence of malignancy   BREAST BIOPSY Right 11-2015   cataracts Bilateral    COLONOSCOPY W/ BIOPSIES     EP IMPLANTABLE DEVICE N/A 11/27/2015   MDT Adapta L gen change by Dr Rayann Heman   HEMORRHOID SURGERY     PPM  2008   MDT dual chamber PPM implanted by Dr Verlon Setting for sick sinus and intermittent CHB   TOTAL ABDOMINAL  HYSTERECTOMY     oophorectomy   US ECHOCARDIOGRAPHY  12/08/2005   EF 55-60%    Current Medications: Current Meds  Medication Sig   apixaban (ELIQUIS) 2.5 MG TABS tablet Take 1 tablet (2.5 mg total) by mouth 2 (two) times daily.   Calcium Carbonate-Vitamin D (CALCIUM 600/VITAMIN D PO) Take 1 capsule by mouth daily.   cholecalciferol (VITAMIN D) 1000 units tablet Take 2,000 Units by mouth daily.   ezetimibe (ZETIA) 10 MG tablet TAKE 1 TABLET BY MOUTH  DAILY   losartan (COZAAR) 50 MG tablet TAKE 1 TABLET BY MOUTH  DAILY   metoprolol tartrate (LOPRESSOR) 25 MG tablet Take 0.5 tablets (12.5 mg total) by mouth 2 (two) times daily.   Misc Natural Products (TART CHERRY ADVANCED) CAPS Take 1 capsule by mouth daily.   multivitamin (THERAGRAN) per tablet Take 1 tablet by mouth daily.   Omega-3 Fatty Acids (FISH OIL) 1200 MG CAPS Take one tablet by mouth daily     Allergies:   Anesthetics, ester; Lisinopril; and Other   Social History   Socioeconomic History   Marital status: Single    Spouse name: Not on file   Number of children: 0   Years of education: Not on file   Highest education level: Not  on file  Occupational History   Occupation: retired     Fish farm manager: RETIRED  Tobacco Use   Smoking status: Former    Types: Cigarettes    Quit date: 08/23/1988    Years since quitting: 33.0   Smokeless tobacco: Never   Tobacco comments:    >20 yrs ago, used to smoke ~ 1 ppd  Vaping Use   Vaping Use: Never used  Substance and Sexual Activity   Alcohol use: Yes    Comment: socially   Drug use: Yes    Types: IV   Sexual activity: Not on file  Other Topics Concern   Not on file  Social History Narrative   Lives by herself, in a condominium,  still drives.   Lost her  twin sister 12/2019   Has  extended family members checking on her   Brother in law passed 11-2020       Social Determinants of Health   Financial Resource Strain: Not on file  Food Insecurity: Not on file  Transportation  Needs: Not on file  Physical Activity: Not on file  Stress: Not on file  Social Connections: Not on file     Family History: The patient's family history includes Breast cancer (age of onset: 42) in her sister; Coronary artery disease in an other family member; Heart disease in her father; Heart failure in her mother; Hypertension in her sister; Prostate cancer in her brother. There is no history of Colon cancer or Diabetes.  ROS:   Please see the history of present illness.     All other systems reviewed and are negative.  EKGs/Labs/Other Studies Reviewed:    The following studies were reviewed today:  N/A  EKG:  EKG is ordered today.  Afib with demand V pacing rate 79. I have personally reviewed and interpreted this study.   Recent Labs: 04/02/2021: ALT 19; BUN 14; Creatinine, Ser 0.77; Hemoglobin 13.9; Platelets 218.0; Potassium 4.8; Sodium 137  Recent Lipid Panel    Component Value Date/Time   CHOL 177 04/02/2021 0925   CHOL 220 (H) 12/01/2017 1100   TRIG 63.0 04/02/2021 0925   TRIG 62 06/17/2009 0000   HDL 77.20 04/02/2021 0925   HDL 108 12/01/2017 1100   CHOLHDL 2 04/02/2021 0925   VLDL 12.6 04/02/2021 0925   LDLCALC 88 04/02/2021 0925   LDLCALC 101 (H) 12/01/2017 1100   LDLDIRECT 105.8 09/07/2012 0843     Risk Assessment/Calculations:       Physical Exam:    VS:  BP 119/71   Pulse 79   Ht '5\' 5"'$  (1.651 m)   Wt 121 lb 9.6 oz (55.2 kg)   SpO2 97%   BMI 20.24 kg/m     Wt Readings from Last 3 Encounters:  09/09/21 121 lb 9.6 oz (55.2 kg)  08/31/21 123 lb (55.8 kg)  04/02/21 120 lb 8 oz (54.7 kg)     GEN:  Well nourished, thin in no acute distress HEENT: Normal NECK: No JVD; No carotid bruits LYMPHATICS: No lymphadenopathy CARDIAC: RRR, no murmurs, rubs, gallops RESPIRATORY:  Clear to auscultation without rales, wheezing or rhonchi  ABDOMEN: Soft, non-tender, non-distended MUSCULOSKELETAL:  No edema; No deformity  SKIN: Warm and dry NEUROLOGIC:   Alert and oriented x 3 PSYCHIATRIC:  Normal affect   ASSESSMENT:    1. Permanent atrial fibrillation ([place])   2. Heart block AV complete ([place])   3. Essential hypertension      PLAN:    In order of  problems listed above:  Persistent atrial fibrillation: Has been in 100% atrial fibrillation since Feb 2022.  She has no cardiac awareness of atrial fibrillation, heart rate is very well controlled.   We will continue with rate control strategy.  Hypertension: Blood pressure stable  Hyperlipidemia: Continue Zetia and fish oil  History of pacemaker: Followed by EP service.- recent pacer check on 08/18/21 was normal. Reprogrammed to VVI mode.   Follow  up in 6 months  Medication Adjustments/Labs and Tests Ordered: Current medicines are reviewed at length with the patient today.  Concerns regarding medicines are outlined above.  Orders Placed This Encounter  Procedures   EKG 12-Lead    No orders of the defined types were placed in this encounter.    Patient Instructions  Medication Instructions:  Your physician recommends that you continue on your current medications as directed. Please refer to the Current Medication list given to you today.  *If you need a refill on your cardiac medications before your next appointment, please call your pharmacy*  Follow-Up: At [facility], you and your health needs are our priority.  As part of our continuing mission to provide you with exceptional heart care, we have created designated Provider Care Teams.  These Care Teams include your primary Cardiologist (physician) and Advanced Practice Providers (APPs -  Physician Assistants and Nurse Practitioners) who all work together to provide you with the care you need, when you need it.  We recommend signing up for the patient portal called "MyChart".  Sign up information is provided on this After Visit Summary.  MyChart is used to connect with patients for Virtual Visits (Telemedicine).  Patients  are able to view lab/test results, encounter notes, upcoming appointments, etc.  Non-urgent messages can be sent to your provider as well.   To learn more about what you can do with MyChart, go to [email or web address].    Your next appointment:   6 month(s)  The format for your next appointment:   In Person  Provider:   Shahzain Kiester Martinique, MD {  Important Information About Sugar         Signed, Osmany Azer Martinique, MD  09/09/2021 3:01 PM    [place]

## 2021-09-09 ENCOUNTER — Encounter: Payer: Self-pay | Admitting: Cardiology

## 2021-09-09 ENCOUNTER — Ambulatory Visit (INDEPENDENT_AMBULATORY_CARE_PROVIDER_SITE_OTHER): Payer: Medicare Other | Admitting: Cardiology

## 2021-09-09 VITALS — BP 119/71 | HR 79 | Ht 65.0 in | Wt 121.6 lb

## 2021-09-09 DIAGNOSIS — I442 Atrioventricular block, complete: Secondary | ICD-10-CM

## 2021-09-09 DIAGNOSIS — I10 Essential (primary) hypertension: Secondary | ICD-10-CM

## 2021-09-09 DIAGNOSIS — I4821 Permanent atrial fibrillation: Secondary | ICD-10-CM

## 2021-09-09 NOTE — Patient Instructions (Signed)
Medication Instructions:  Your physician recommends that you continue on your current medications as directed. Please refer to the Current Medication list given to you today.  *If you need a refill on your cardiac medications before your next appointment, please call your pharmacy*  Follow-Up: At [facility], you and your health needs are our priority.  As part of our continuing mission to provide you with exceptional heart care, we have created designated Provider Care Teams.  These Care Teams include your primary Cardiologist (physician) and Advanced Practice Providers (APPs -  Physician Assistants and Nurse Practitioners) who all work together to provide you with the care you need, when you need it.  We recommend signing up for the patient portal called "MyChart".  Sign up information is provided on this After Visit Summary.  MyChart is used to connect with patients for Virtual Visits (Telemedicine).  Patients are able to view lab/test results, encounter notes, upcoming appointments, etc.  Non-urgent messages can be sent to your provider as well.   To learn more about what you can do with MyChart, go to [email or web address].    Your next appointment:   6 month(s)  The format for your next appointment:   In Person  Provider:   Peter Jordan, MD {    Important Information About Sugar       

## 2021-09-10 ENCOUNTER — Encounter (HOSPITAL_COMMUNITY): Payer: Self-pay | Admitting: *Deleted

## 2021-09-10 ENCOUNTER — Emergency Department (HOSPITAL_COMMUNITY)
Admission: EM | Admit: 2021-09-10 | Discharge: 2021-09-11 | Disposition: A | Discharge status: Home / Self Care | Payer: Medicare Other | Attending: Emergency Medicine | Admitting: Emergency Medicine

## 2021-09-10 ENCOUNTER — Other Ambulatory Visit: Payer: Self-pay

## 2021-09-10 DIAGNOSIS — S0990XA Unspecified injury of head, initial encounter: Secondary | ICD-10-CM

## 2021-09-10 DIAGNOSIS — W01198A Fall on same level from slipping, tripping and stumbling with subsequent striking against other object, initial encounter: Secondary | ICD-10-CM | POA: Diagnosis not present

## 2021-09-10 DIAGNOSIS — S0101XA Laceration without foreign body of scalp, initial encounter: Secondary | ICD-10-CM | POA: Diagnosis not present

## 2021-09-10 DIAGNOSIS — Z7901 Long term (current) use of anticoagulants: Secondary | ICD-10-CM | POA: Diagnosis not present

## 2021-09-10 DIAGNOSIS — Z79899 Other long term (current) drug therapy: Secondary | ICD-10-CM | POA: Diagnosis not present

## 2021-09-10 MED ORDER — ONDANSETRON 4 MG PO TBDP
4.0000 mg | ORAL_TABLET | Freq: Once | ORAL | Status: AC
Start: 2021-09-11 — End: 2021-09-10
  Administered 2021-09-10: 4 mg via ORAL
  Filled 2021-09-10: qty 1

## 2021-09-10 NOTE — ED Provider Notes (Signed)
[facility] EMERGENCY DEPARTMENT Provider Note   CSN: [id number] Arrival date & time: 09/10/21  2333     History  Chief Complaint  Patient presents with   Peggy Mendez is a 88 y.o. female.  88 year old female who presents the ER today secondary to a level 2 trauma.  Patient was trying to kill a cockroach and she grabbed the TV for support and the TV fell then she fell and she hit her head.  She sustained a small laceration to her right parietal scalp area.  No loss of consciousness.  Couple episodes of nausea since then but no vomiting.  Does not complain of any pain elsewhere in her body or extremities.  She states she has not tender her legs but that is chronic EMS states that her vital signs are stable in route.  She initially hemostatic to the laceration on her head but it has since started bleeding again.  Is on Eliquis for chronic A-fib. TDAP UTD.    Fall       Home Medications Prior to Admission medications   Medication Sig Start Date End Date Taking? Authorizing Provider  apixaban (ELIQUIS) 2.5 MG TABS tablet Take 1 tablet (2.5 mg total) by mouth 2 (two) times daily. 04/06/21  Yes Martinique, Peter M, MD  Calcium Carbonate-Vitamin D (CALCIUM 600/VITAMIN D PO) Take 1 capsule by mouth daily with lunch.   Yes [provider]  cholecalciferol (VITAMIN D) 1000 units tablet Take 2,000 Units by mouth daily.   Yes [provider]  ezetimibe (ZETIA) 10 MG tablet TAKE 1 TABLET BY MOUTH  DAILY Patient taking differently: Take 10 mg by mouth at bedtime. 04/06/21  Yes Martinique, Peter M, MD  losartan (COZAAR) 50 MG tablet TAKE 1 TABLET BY MOUTH  DAILY Patient taking differently: Take 50 mg by mouth at bedtime. 04/06/21  Yes Martin[place], Peter M, MD  metoprolol tartrate (LOPRESSOR) 25 MG tablet Take 0.5 tablets (12.5 mg total) by mouth 2 (two) times daily. 07/08/21  Yes Martinique, Peter M, MD  Misc Natural Products [facility] ADVANCED) CAPS Take 1 capsule  by mouth at bedtime.   Yes [provider]  multivitamin [facility]) per tablet Take 1 tablet by mouth daily.   Yes [provider]  Omega-3 Fatty Acids (FISH OIL) 1200 MG CAPS Take 1,200 mg by mouth daily with lunch.   Yes [provider]      Allergies    Anesthetics, ester; Lisinopril; and Other    Review of Systems   Review of Systems  Physical Exam Updated Vital Signs BP 112/60   Pulse 73   Temp 97.9 F (36.6 C) (Temporal)   Resp 20   Ht '5\' 5"'$  (1.651 m)   Wt 54 kg   SpO2 94%   BMI 19.80 kg/m  Physical Exam Vitals and nursing note reviewed.  Constitutional:      Appearance: She is well-developed.  HENT:     Head: Normocephalic.     Comments: 1 cm laceration to right parietal area    Mouth/Throat:     Mouth: Mucous membranes are moist.     Pharynx: Oropharynx is clear.  Eyes:     Pupils: Pupils are equal, round, and reactive to light.  Cardiovascular:     Rate and Rhythm: Normal rate and regular rhythm.  Pulmonary:     Effort: No respiratory distress.     Breath sounds: No stridor.  Abdominal:  General: Abdomen is flat. There is no distension.  Musculoskeletal:     Cervical back: Normal range of motion.     Comments: No cervical spine tenderness, thoracic spine tenderness or Lumbar spine tenderness.  No tenderness or pain with palpation and full ROM of all joints in upper and lower extremities.  No ecchymosis or other signs of trauma on back or extremities.  No Pain with AP or lateral compression of ribs.  NoParacervical ttp, paraspinal ttp   Skin:    General: Skin is warm and dry.  Neurological:     General: No focal deficit present.     Mental Status: She is alert.     ED Results / Procedures / Treatments   Labs (all labs ordered are listed, but only abnormal results are displayed) Labs Reviewed  COMPREHENSIVE METABOLIC PANEL - Abnormal; Notable for the following components:      Result Value   Glucose, Bld 143 (*)     BUN 25 (*)    Total Protein 6.3 (*)    All other components within normal limits  CBC WITH DIFFERENTIAL/PLATELET  PROTIME-INR    EKG None  Radiology CT Head Wo Contrast  Result Date: 09/11/2021 CLINICAL DATA:  Head trauma, minor (Age >= 65y). Chronic anticoagulation EXAM: CT HEAD WITHOUT CONTRAST TECHNIQUE: Contiguous axial images were obtained from the base of the skull through the vertex without intravenous contrast. RADIATION DOSE REDUCTION: This exam was performed according to the departmental dose-optimization program which includes automated exposure control, adjustment of the mA and/or kV according to patient size and/or use of iterative reconstruction technique. COMPARISON:  None Available. FINDINGS: Brain: Normal anatomic configuration. Parenchymal volume loss is commensurate with the patient's age. Mild periventricular white matter changes are present likely reflecting the sequela of small vessel ischemia. No abnormal intra or extra-axial mass lesion or fluid collection. No abnormal mass effect or midline shift. No evidence of acute intracranial hemorrhage or infarct. Ventricular size is normal. Cerebellum unremarkable. Vascular: No asymmetric hyperdense vasculature at the skull base. Skull: Intact Sinuses/Orbits: Paranasal sinuses are clear. Orbits are unremarkable. Other: Mastoid air cells and middle ear cavities are clear. There is mild right parietal scalp soft tissue swelling with multiple skin staples noted. IMPRESSION: No acute intracranial injury. No calvarial fracture. Right parietal scalp soft tissue swelling. Electronically Signed   By: Fidela Salisbury M.D.   On: 09/11/2021 00:28    Procedures .Marland KitchenLaceration Repair  Date/Time: 09/11/2021 12:11 AM  Performed by: Merrily Pew, MD Authorized by: Merrily Pew, MD   Consent:    Consent obtained:  Verbal   Consent given by:  Patient   Risks discussed:  Infection, need for additional repair, nerve damage, poor wound healing, poor  cosmetic result, pain, retained foreign body, tendon damage and vascular damage   Alternatives discussed:  No treatment, delayed treatment and observation Universal protocol:    Procedure explained and questions answered to patient or proxy's satisfaction: yes     Relevant documents present and verified: yes     Site/side marked: yes     Patient identity confirmed:  Hospital-assigned identification number and arm band Anesthesia:    Anesthesia method:  None Laceration details:    Location:  Scalp   Scalp location:  R parietal   Length (cm):  1   Depth (mm):  3 Pre-procedure details:    Preparation:  Patient was prepped and draped in usual sterile fashion and imaging obtained to evaluate for foreign bodies Exploration:    Wound exploration:  wound explored through full range of motion   Treatment:    Area cleansed with:  Saline   Amount of cleaning:  Standard   Irrigation solution:  Sterile water   Irrigation method:  Syringe Skin repair:    Repair method:  Staples   Number of staples:  3 Approximation:    Approximation:  Close Repair type:    Repair type:  Simple Post-procedure details:    Dressing:  Antibiotic ointment   Procedure completion:  Tolerated well, no immediate complications     Medications Ordered in ED Medications  ondansetron (ZOFRAN-ODT) disintegrating tablet 4 mg (4 mg Oral Given 09/10/21 2356)  lactated ringers bolus 1,000 mL (0 mLs Intravenous Stopped 09/11/21 0418)  metoCLOPramide (REGLAN) injection 5 mg (5 mg Intravenous Given 09/11/21 0314)    ED Course/ Medical Decision Making/ A&P                           Medical Decision Making Amount and/or Complexity of Data Reviewed Labs: ordered. Radiology: ordered.  Risk Prescription drug management.   Staples placed to obtain hemostasis. No other areas of pain or obvious injury, will ct head. Workup unremarkable. Ambulation improved. Stable for discharge.   Final Clinical Impression(s) / ED  Diagnoses Final diagnoses:  Injury of head, initial encounter  Laceration of scalp, initial encounter    Rx / DC Orders ED Discharge Orders     None         Nakeda Lebron, [name], MD 09/11/21 [id number]

## 2021-09-10 NOTE — ED Notes (Incomplete)
..  Trauma Response Nurse Documentation   Peggy Mendez is a 88 y.o. female arriving to [facility] ED via [facility]  On Eliquis (apixaban) daily. Trauma was activated as a Level 2 by charge nurse based on the following trauma criteria Elderly patients > 65 with head trauma on anti-coagulation (excluding ASA). Trauma team at the bedside on patient arrival. Patient cleared for CT by Dr. Dayna Barker. Patient to CT with team. GCS 15.  History   Past Medical History:  Diagnosis Date  . Cataract    REMOVED  . History of colonic polyps   . Hyperlipidemia   . Hypertension   . Osteopenia   . Paroxysmal atrial fibrillation (HCC)   . Sick sinus syndrome ([place])   . Skin cancer 2010   Left leg, SCC, sees derm  . Syncope 11/2005     Past Surgical History:  Procedure Laterality Date  . APPENDECTOMY    . BREAST BIOPSY Bilateral    several, 11/2014 showed fibrocystic changes, no evidence of malignancy  . BREAST BIOPSY Right 11-2015  . cataracts Bilateral   . COLONOSCOPY W/ BIOPSIES    . EP IMPLANTABLE DEVICE N/A 11/27/2015   MDT Adapta L gen change by Dr Rayann Heman  . HEMORRHOID SURGERY    . PPM  2008   MDT dual chamber PPM implanted by Dr Verlon Setting for sick sinus and intermittent CHB  . TOTAL ABDOMINAL HYSTERECTOMY     oophorectomy  . US ECHOCARDIOGRAPHY  12/08/2005   EF 55-60%       Initial Focused Assessment (If applicable, or please see trauma documentation):  See trauma documentation CT's Completed:   CT Head   Interventions:  See Narrative Plan for disposition:  Other   Consults completed:  none   Event Summary:  Pt arrived via [facility] from home  after falling while she was attempting to kill a bug, striking R occiput on TV stand. Laceration noted with active bleeding noted. Pt is on Eliquis.   MTP Summary (If applicable):   Bedside handoff with {Trauma handoff:[id number]::"ED RN"} ***.    Peggy Mendez, [place]  Trauma Response RN  Please call TRN at [phone] for further assistance.

## 2021-09-10 NOTE — ED Triage Notes (Signed)
Pt was trying to kill a cockroach near a tv stand, the tv fell then the patient fell back hitting her head, small laceration to posterior head. Pt is on Eliquis for Afib; 3 staples placed on arrival

## 2021-09-10 NOTE — ED Notes (Addendum)
..  Trauma Response Nurse Documentation   Peggy Mendez is a 88 y.o. female arriving to [facility] ED via [facility]  On Eliquis (apixaban) daily. Trauma was activated as a Level 2 by charge nurse based on the following trauma criteria Elderly patients > 65 with head trauma on anti-coagulation (excluding ASA). Trauma team at the bedside on patient arrival. Patient cleared for CT by Dr. Dayna Barker. Patient to CT with team. GCS 15.  History   Past Medical History:  Diagnosis Date   Cataract    REMOVED   History of colonic polyps    Hyperlipidemia    Hypertension    Osteopenia    Paroxysmal atrial fibrillation (HCC)    Sick sinus syndrome (HCC)    Skin cancer 2010   Left leg, SCC, sees derm   Syncope 11/2005     Past Surgical History:  Procedure Laterality Date   APPENDECTOMY     BREAST BIOPSY Bilateral    several, 11/2014 showed fibrocystic changes, no evidence of malignancy   BREAST BIOPSY Right 11-2015   cataracts Bilateral    COLONOSCOPY W/ BIOPSIES     EP IMPLANTABLE DEVICE N/A 11/27/2015   MDT Adapta L gen change by Dr Rayann Heman   HEMORRHOID SURGERY     PPM  2008   MDT dual chamber PPM implanted by Dr Verlon Setting for sick sinus and intermittent CHB   TOTAL ABDOMINAL HYSTERECTOMY     oophorectomy   US ECHOCARDIOGRAPHY  12/08/2005   EF 55-60%       Initial Focused Assessment (If applicable, or please see trauma documentation):  See trauma documentation CT's Completed:   CT Head   Interventions:  See Narrative Plan for disposition:  Other   Consults completed:  none   Event Summary:  Pt arrived via [facility] from home after falling while she was attempting to kill a bug, striking R occiput on TV stand. Laceration noted with active bleeding noted. Pt is on Eliquis.  Dr. Dayna Barker at bedside for wound care, staples x 3 placed.  GCS 15.  Pt reports she lives alone and has not family to assist with home care.  Pt transported to CT, moderate amount of emesis prior to CT then again  after returning to ED room, primary RN updated.    Bedside handoff with ED RN [place].    Vaida Kerchner, [place]  Trauma Response RN  Please call TRN at [phone] for further assistance.

## 2021-09-10 NOTE — ED Provider Notes (Incomplete)
  [place] EMERGENCY DEPARTMENT Provider Note   CSN: [id number] Arrival date & time: 09/10/21  2333     History {Add pertinent medical, surgical, social history, OB history to HPI:1} Chief Complaint  Patient presents with  . Fall    Jazzlynn Rawe [place] is a 88 y.o. female.  88 year old female who presents the ER today secondary to a level 2 trauma.  Patient was trying to kill a cockroach and she grabbed the TV for support and the TV fell then she fell and she hit her head.  She sustained a small laceration to her right parietal scalp area.  No loss of consciousness.  Couple episodes of nausea since then but no vomiting.   Fall       Home Medications Prior to Admission medications   Medication Sig Start Date End Date Taking? Authorizing Provider  apixaban (ELIQUIS) 2.5 MG TABS tablet Take 1 tablet (2.5 mg total) by mouth 2 (two) times daily. 04/06/21   Martinique, Peter M, MD  Calcium Carbonate-Vitamin D (CALCIUM 600/VITAMIN D PO) Take 1 capsule by mouth daily.    [provider]  cholecalciferol (VITAMIN D) 1000 units tablet Take 2,000 Units by mouth daily.    [provider]  ezetimibe (ZETIA) 10 MG tablet TAKE 1 TABLET BY MOUTH  DAILY 04/06/21   Martin[place], Peter M, MD  losartan (COZAAR) 50 MG tablet TAKE 1 TABLET BY MOUTH  DAILY 04/06/21   Martin[place], Peter M, MD  metoprolol tartrate (LOPRESSOR) 25 MG tablet Take 0.5 tablets (12.5 mg total) by mouth 2 (two) times daily. 07/08/21   [place], Peter M, MD  Misc Natural Products [facility] ADVANCED) CAPS Take 1 capsule by mouth daily.    [provider]  multivitamin [facility]) per tablet Take 1 tablet by mouth daily.    [provider]  Omega-3 Fatty Acids (FISH OIL) 1200 MG CAPS Take one tablet by mouth daily    [provider]      Allergies    Anesthetics, ester; Lisinopril; and Other    Review of Systems   Review of Systems  Physical Exam Updated Vital Signs BP 128/90    Ht '5\' 5"'$  (1.651 m)   Wt 54 kg   SpO2 98%   BMI 19.80 kg/m  Physical Exam  ED Results / Procedures / Treatments   Labs (all labs ordered are listed, but only abnormal results are displayed) Labs Reviewed - No data to display  EKG None  Radiology No results found.  Procedures Procedures  {Document cardiac monitor, telemetry assessment procedure when appropriate:1}  Medications Ordered in ED Medications - No data to display  ED Course/ Medical Decision Making/ A&P                           Medical Decision Making  ***  {Document critical care time when appropriate:1} {Document review of labs and clinical decision tools ie heart score, Chads2Vasc2 etc:1}  {Document your independent review of radiology images, and any outside records:1} {Document your discussion with family members, caretakers, and with consultants:1} {Document social determinants of health affecting pt's care:1} {Document your decision making why or why not admission, treatments were needed:1} Final Clinical Impression(s) / ED Diagnoses Final diagnoses:  None    Rx / DC Orders ED Discharge Orders     None

## 2021-09-11 ENCOUNTER — Emergency Department (HOSPITAL_COMMUNITY): Payer: Medicare Other

## 2021-09-11 LAB — COMPREHENSIVE METABOLIC PANEL
ALT: 23 U/L (ref 0–44)
AST: 29 U/L (ref 15–41)
Albumin: 3.5 g/dL (ref 3.5–5.0)
Alkaline Phosphatase: 69 U/L (ref 38–126)
Anion gap: 8 (ref 5–15)
BUN: 25 mg/dL — ABNORMAL HIGH (ref 8–23)
CO2: 24 mmol/L (ref 22–32)
Calcium: 9.4 mg/dL (ref 8.9–10.3)
Chloride: 105 mmol/L (ref 98–111)
Creatinine, Ser: 0.77 mg/dL (ref 0.44–1.00)
GFR, Estimated: >60 mL/min (ref >60)
Glucose, Bld: 143 mg/dL — ABNORMAL HIGH (ref 70–99)
Potassium: 4.3 mmol/L (ref 3.5–5.1)
Sodium: 137 mmol/L (ref 135–145)
Total Bilirubin: 0.6 mg/dL (ref 0.3–1.2)
Total Protein: 6.3 g/dL — ABNORMAL LOW (ref 6.5–8.1)

## 2021-09-11 LAB — CBC WITH DIFFERENTIAL/PLATELET
Abs Immature Granulocytes: 0.03 10*3/uL (ref 0.00–0.07)
Basophils Absolute: 0.1 10*3/uL (ref 0.0–0.1)
Basophils Relative: 1 %
Eosinophils Absolute: 0.2 10*3/uL (ref 0.0–0.5)
Eosinophils Relative: 3 %
HCT: 42.7 % (ref 36.0–46.0)
Hemoglobin: 14 g/dL (ref 12.0–15.0)
Immature Granulocytes: 0 %
Lymphocytes Relative: 32 %
Lymphs Abs: 2.7 10*3/uL (ref 0.7–4.0)
MCH: 28.7 pg (ref 26.0–34.0)
MCHC: 32.8 g/dL (ref 30.0–36.0)
MCV: 87.5 fL (ref 80.0–100.0)
Monocytes Absolute: 0.9 10*3/uL (ref 0.1–1.0)
Monocytes Relative: 11 %
Neutro Abs: 4.5 10*3/uL (ref 1.7–7.7)
Neutrophils Relative %: 53 %
Platelets: 210 10*3/uL (ref 150–400)
RBC: 4.88 MIL/uL (ref 3.87–5.11)
RDW: 13.9 % (ref 11.5–15.5)
WBC: 8.5 10*3/uL (ref 4.0–10.5)
nRBC: 0 % (ref 0.0–0.2)

## 2021-09-11 LAB — PROTIME-INR
INR: 1.1 (ref 0.8–1.2)
Prothrombin Time: 14.4 seconds (ref 11.4–15.2)

## 2021-09-11 MED ORDER — LACTATED RINGERS IV BOLUS
1000.0000 mL | Freq: Once | INTRAVENOUS | Status: AC
  Administered 2021-09-11: 1000 mL via INTRAVENOUS

## 2021-09-11 MED ORDER — METOCLOPRAMIDE HCL 5 MG/ML IJ SOLN
5.0000 mg | Freq: Once | INTRAMUSCULAR | Status: AC
  Administered 2021-09-11: 5 mg via INTRAVENOUS
  Filled 2021-09-11: qty 2

## 2021-09-11 NOTE — ED Notes (Signed)
Pt ambulated with 1-assist to bathroom. Reports improvement of both dizziness and nausea, although still feels dizzy when moving

## 2021-09-11 NOTE — ED Notes (Signed)
Pt ambulatory in hallway with one person assist. She states she is able to walk independently at home usually but tonight she feels very dizzy. Attempted to ambulate independently but she reported feeling very dizzy and nauseous and was not able to walk without assistance.

## 2021-09-11 NOTE — ED Notes (Signed)
Pt given ginger ale.

## 2021-09-17 ENCOUNTER — Emergency Department (HOSPITAL_BASED_OUTPATIENT_CLINIC_OR_DEPARTMENT_OTHER)
Admission: EM | Admit: 2021-09-17 | Discharge: 2021-09-17 | Disposition: A | Discharge status: Home / Self Care | Payer: Medicare Other | Attending: Emergency Medicine | Admitting: Emergency Medicine

## 2021-09-17 ENCOUNTER — Encounter (HOSPITAL_BASED_OUTPATIENT_CLINIC_OR_DEPARTMENT_OTHER): Payer: Self-pay

## 2021-09-17 DIAGNOSIS — Z4802 Encounter for removal of sutures: Secondary | ICD-10-CM | POA: Insufficient documentation

## 2021-09-17 DIAGNOSIS — Z7901 Long term (current) use of anticoagulants: Secondary | ICD-10-CM | POA: Diagnosis not present

## 2021-09-17 DIAGNOSIS — W19XXXD Unspecified fall, subsequent encounter: Secondary | ICD-10-CM | POA: Diagnosis not present

## 2021-09-17 DIAGNOSIS — S0101XD Laceration without foreign body of scalp, subsequent encounter: Secondary | ICD-10-CM | POA: Diagnosis present

## 2021-09-17 NOTE — ED Provider Notes (Signed)
[place] EMERGENCY DEPARTMENT Provider Note   CSN: [id number] Arrival date & time: 09/17/21  2094     History  Chief Complaint  Patient presents with   Suture / Staple Removal    Peggy Mendez is a 88 y.o. female.  88 year old female returns to the ER for removal of staples from the right side of her scalp.  Patient states that she fell 1 week ago, came to the emergency room where staples were placed.  She is doing well, wound appears to be healing well and she has no other complaints or concerns.       Home Medications Prior to Admission medications   Medication Sig Start Date End Date Taking? Authorizing Provider  apixaban (ELIQUIS) 2.5 MG TABS tablet Take 1 tablet (2.5 mg total) by mouth 2 (two) times daily. 04/06/21   Martinique, Peter M, MD  Calcium Carbonate-Vitamin D (CALCIUM 600/VITAMIN D PO) Take 1 capsule by mouth daily with lunch.    [provider]  cholecalciferol (VITAMIN D) 1000 units tablet Take 2,000 Units by mouth daily.    [provider]  ezetimibe (ZETIA) 10 MG tablet TAKE 1 TABLET BY MOUTH  DAILY Patient taking differently: Take 10 mg by mouth at bedtime. 04/06/21   Martinique, Peter M, MD  losartan (COZAAR) 50 MG tablet TAKE 1 TABLET BY MOUTH  DAILY Patient taking differently: Take 50 mg by mouth at bedtime. 04/06/21   Martin[place], Peter M, MD  metoprolol tartrate (LOPRESSOR) 25 MG tablet Take 0.5 tablets (12.5 mg total) by mouth 2 (two) times daily. 07/08/21   Martin[place], Peter M, MD  Misc Natural Products [facility] ADVANCED) CAPS Take 1 capsule by mouth at bedtime.    [provider]  multivitamin [facility]) per tablet Take 1 tablet by mouth daily.    [provider]  Omega-3 Fatty Acids (FISH OIL) 1200 MG CAPS Take 1,200 mg by mouth daily with lunch.    [provider]      Allergies    Anesthetics, ester; Lisinopril; and Other    Review of Systems   Review of Systems Negative except as per HPI Physical  Exam Updated Vital Signs BP (!) 142/76 (BP Location: Left Arm)   Pulse 85   Temp 97.9 F (36.6 C) (Oral)   Resp 18   Ht '5\' 5"'$  (1.651 m)   Wt 54 kg   SpO2 98%   BMI 19.80 kg/m  Physical Exam Vitals and nursing note reviewed.  Constitutional:      General: She is not in acute distress.    Appearance: She is well-developed. She is not diaphoretic.  HENT:     Head: Normocephalic and atraumatic.     Comments: Staples in scalp and right posterior parietal Pulmonary:     Effort: Pulmonary effort is normal.  Musculoskeletal:     Cervical back: Normal range of motion and neck supple. No tenderness.  Skin:    General: Skin is warm and dry.     Findings: No erythema or rash.  Neurological:     Mental Status: She is alert and oriented to person, place, and time.  Psychiatric:        Behavior: Behavior normal.     ED Results / Procedures / Treatments   Labs (all labs ordered are listed, but only abnormal results are displayed) Labs Reviewed - No data to display  EKG None  Radiology No results found.  Procedures Procedures    Medications Ordered in ED  Medications - No data to display  ED Course/ Medical Decision Making/ A&P                           Medical Decision Making  88 year old female presents for removal of 3 staples from her scalp which were placed 1 week ago after a fall.  Patient is on Eliquis, head CT at that time is negative for acute injury.  She reports she is doing well.  Plan is to remove staples, can follow-up with her PCP as needed.        Final Clinical Impression(s) / ED Diagnoses Final diagnoses:  Encounter for staple removal    Rx / DC Orders ED Discharge Orders     None         Tacy Learn, PA-C 09/17/21 1011    Hayden Rasmussen, MD 09/17/21 [phone]

## 2021-09-17 NOTE — ED Triage Notes (Signed)
Here for staple removal from head, placed on 6/9

## 2021-09-21 ENCOUNTER — Telehealth: Payer: Self-pay | Admitting: *Deleted

## 2021-09-21 NOTE — Telephone Encounter (Signed)
Patient called to get scheduled for AWV and patient declined and only wants to see Dr. Larose Kells.

## 2021-09-22 ENCOUNTER — Other Ambulatory Visit: Payer: Self-pay | Admitting: Cardiology

## 2021-09-22 DIAGNOSIS — I480 Paroxysmal atrial fibrillation: Secondary | ICD-10-CM

## 2021-09-22 NOTE — Telephone Encounter (Signed)
Prescription refill request for Eliquis received. Indication:Afib Last office visit:6/23 Scr:0.7 Age: 88 Weight:54 kg  Prescription refilled

## 2021-11-17 ENCOUNTER — Ambulatory Visit (INDEPENDENT_AMBULATORY_CARE_PROVIDER_SITE_OTHER): Payer: Medicare Other

## 2021-11-17 DIAGNOSIS — I442 Atrioventricular block, complete: Secondary | ICD-10-CM | POA: Diagnosis not present

## 2021-11-18 LAB — CUP PACEART REMOTE DEVICE CHECK
Battery Impedance: 354 Ohm
Battery Remaining Longevity: 113 mo
Battery Voltage: 2.79 V
Brady Statistic RV Percent Paced: 32 %
Date Time Interrogation Session: 20230816112953
Implantable Lead Implant Date: 20080318
Implantable Lead Implant Date: 20080318
Implantable Lead Location: 753859
Implantable Lead Location: 753860
Implantable Lead Model: 5076
Implantable Lead Model: 5076
Implantable Pulse Generator Implant Date: 20170825
Lead Channel Impedance Value: 493 Ohm
Lead Channel Impedance Value: 67 Ohm
Lead Channel Pacing Threshold Amplitude: 1 V
Lead Channel Pacing Threshold Pulse Width: 0.4 ms
Lead Channel Setting Pacing Amplitude: 2.5 V
Lead Channel Setting Pacing Pulse Width: 0.4 ms
Lead Channel Setting Sensing Sensitivity: 5.6 mV

## 2021-11-29 ENCOUNTER — Ambulatory Visit: Payer: Medicare Other | Admitting: Internal Medicine

## 2021-11-30 ENCOUNTER — Ambulatory Visit: Payer: Medicare Other | Admitting: Internal Medicine

## 2021-12-07 ENCOUNTER — Ambulatory Visit: Payer: Medicare Other | Admitting: Internal Medicine

## 2021-12-07 ENCOUNTER — Encounter: Payer: Self-pay | Admitting: Internal Medicine

## 2021-12-07 VITALS — BP 126/82 | HR 87 | Temp 98.2°F | Resp 18 | Ht 63.25 in | Wt 121.0 lb

## 2021-12-07 DIAGNOSIS — E7800 Pure hypercholesterolemia, unspecified: Secondary | ICD-10-CM | POA: Diagnosis not present

## 2021-12-07 DIAGNOSIS — W19XXXD Unspecified fall, subsequent encounter: Secondary | ICD-10-CM

## 2021-12-07 DIAGNOSIS — I10 Essential (primary) hypertension: Secondary | ICD-10-CM

## 2021-12-07 LAB — LIPID PANEL
Cholesterol: 197 mg/dL (ref 0–200)
HDL: 84.4 mg/dL (ref >39.00)
LDL Cholesterol: 100 mg/dL — ABNORMAL HIGH (ref 0–99)
NonHDL: 112.47
Total CHOL/HDL Ratio: 2
Triglycerides: 64 mg/dL (ref 0.0–149.0)
VLDL: 12.8 mg/dL (ref 0.0–40.0)

## 2021-12-07 LAB — TSH: TSH: 2.78 u[IU]/mL (ref 0.35–5.50)

## 2021-12-07 NOTE — Patient Instructions (Addendum)
Recommend to proceed with the following vaccines at your pharmacy:  Flu shot- high dose Covid booster (bivalent)  Check the  blood pressure regularly BP GOAL is between 110/65 and  135/85. If it is consistently higher or lower, let me know    GO TO THE LAB : Get the blood work     [place], [place] back for a physical exam by December 2023

## 2021-12-07 NOTE — Progress Notes (Unsigned)
Subjective:    Patient ID: Peggy Mendez, female    DOB: 09/28/1926, 88 y.o.   MRN: [id number]  DOS:  12/07/2021 Type of visit - description: Follow-up  Since the last visit had a fall, went to the ER, notes reviewed. No further falls. Uses a cane consistently. Ambulatory BPs within normal. History of A-fib, good compliance with Eliquis, denies chest pain or difficulty breathing.  No palpitations.   Review of Systems See above   Past Medical History:  Diagnosis Date   Cataract    REMOVED   History of colonic polyps    Hyperlipidemia    Hypertension    Osteopenia    Paroxysmal atrial fibrillation (HCC)    Sick sinus syndrome (HCC)    Skin cancer 2010   Left leg, SCC, sees derm   Syncope 11/2005    Past Surgical History:  Procedure Laterality Date   APPENDECTOMY     BREAST BIOPSY Bilateral    several, 11/2014 showed fibrocystic changes, no evidence of malignancy   BREAST BIOPSY Right 11-2015   cataracts Bilateral    COLONOSCOPY W/ BIOPSIES     EP IMPLANTABLE DEVICE N/A 11/27/2015   MDT Adapta L gen change by Dr Rayann Heman   HEMORRHOID SURGERY     PPM  2008   MDT dual chamber PPM implanted by Dr Verlon Setting for sick sinus and intermittent CHB   TOTAL ABDOMINAL HYSTERECTOMY     oophorectomy   US ECHOCARDIOGRAPHY  12/08/2005   EF 55-60%    Current Outpatient Medications  Medication Instructions   Calcium Carbonate-Vitamin D (CALCIUM 600/VITAMIN D PO) 1 capsule, Oral, Daily with lunch   cholecalciferol (VITAMIN D) 2,000 Units, Oral, Daily   ELIQUIS 2.5 MG TABS tablet TAKE 1 TABLET BY MOUTH TWICE  DAILY   ezetimibe (ZETIA) 10 MG tablet TAKE 1 TABLET BY MOUTH  DAILY   Fish Oil 1,200 mg, Oral, Daily with lunch   losartan (COZAAR) 50 MG tablet TAKE 1 TABLET BY MOUTH  DAILY   metoprolol tartrate (LOPRESSOR) 12.5 mg, Oral, 2 times daily   Misc Natural Products (TART CHERRY ADVANCED) CAPS 1 capsule, Oral, Daily at bedtime   multivitamin (THERAGRAN) per tablet 1 tablet, Oral,  Daily,         Objective:   Physical Exam BP 126/82   Pulse 87   Temp 98.2 F (36.8 C) (Oral)   Resp 18   Ht 5' 3.25" (1.607 m) Comment: w/o shoes  Wt 121 lb (54.9 kg)   SpO2 96%   BMI 21.27 kg/m  General:   Well developed, NAD, BMI noted. HEENT:  Normocephalic . Face symmetric, atraumatic Lungs:  CTA B Normal respiratory effort, no intercostal retractions, no accessory muscle use. Heart: Irregularly irregular Lower extremities: no pretibial edema bilaterally, + pretibial hyperpigmentation bilaterally Skin: Not pale. Not jaundice Neurologic:  alert & oriented X3.  Speech normal, gait appropriate for age, assisted by cane  psych--  Cognition and judgment appear intact.  Cooperative with normal attention span and concentration.  Behavior appropriate. No anxious or depressed appearing.      Assessment     Assessment  HTN Hyperlipidemia.  Lipitor DC'd by cardiology due to aches and pains CV: --Paroxysmal A. Fib >> ELIQUIS --Pacemaker for Mobitz 2 --Syncope 2007, ECHO aortic sclerosis, saw cardiology Osteopenia DEA 08-2006 and 10-2008 -----T score at the spine normal. DEXA 02-2011 T score of the hips was -2.0, -2.1, mild osteopenia. Declined rx as of 02/2018  DJD  Skin cancer, SCC,  left leg, sees dermatology FH breast ca -sister, brother CHF  PLAN Routine checkup HTN: On losartan, metoprolol, last BMP okay.  Reports normal ambulatory BPs. Hyperlipidemia: On Zetia, check FLP and TSH.  Off Lipitor due to aches and pains Fall: 09/10/2021 went to the emergency room, had a mechanical fall, blood work satisfactory, CT head: No acute except for scalp swelling.  Had a staples placed.  No further falls.  She uses cane consistently and she feels steady.  Does have a walker that she uses from time to time. Social: Still lives independently and drives. Vaccines discussed. RTC 03/2022 CPX

## 2021-12-08 NOTE — Assessment & Plan Note (Signed)
Routine checkup HTN: On losartan, metoprolol, last BMP okay.  Reports normal ambulatory BPs. Hyperlipidemia: On Zetia, check FLP and TSH.  Off Lipitor due to aches and pains Fall: 09/10/2021 went to the emergency room, had a mechanical fall, blood work satisfactory, CT head: No acute except for scalp swelling.  Had a staples placed.  No further falls.  She uses cane consistently and she feels steady.  Does have a walker that she uses from time to time. Social: Still lives independently and drives. Vaccines discussed. RTC 03/2022 CPX

## 2021-12-17 NOTE — Progress Notes (Signed)
Remote pacemaker transmission.   

## 2022-02-16 ENCOUNTER — Ambulatory Visit (INDEPENDENT_AMBULATORY_CARE_PROVIDER_SITE_OTHER): Payer: Medicare Other

## 2022-02-16 DIAGNOSIS — Z950 Presence of cardiac pacemaker: Secondary | ICD-10-CM | POA: Diagnosis not present

## 2022-02-16 DIAGNOSIS — I442 Atrioventricular block, complete: Secondary | ICD-10-CM

## 2022-02-16 LAB — CUP PACEART REMOTE DEVICE CHECK
Battery Impedance: 378 Ohm
Battery Remaining Longevity: 112 mo
Battery Voltage: 2.79 V
Brady Statistic RV Percent Paced: 36 %
Date Time Interrogation Session: 20231115092310
Implantable Lead Connection Status: 753985
Implantable Lead Connection Status: 753985
Implantable Lead Implant Date: 20080318
Implantable Lead Implant Date: 20080318
Implantable Lead Location: 753859
Implantable Lead Location: 753860
Implantable Lead Model: 5076
Implantable Lead Model: 5076
Implantable Pulse Generator Implant Date: 20170825
Lead Channel Impedance Value: 571 Ohm
Lead Channel Impedance Value: 67 Ohm
Lead Channel Pacing Threshold Amplitude: 0.75 V
Lead Channel Pacing Threshold Pulse Width: 0.4 ms
Lead Channel Setting Pacing Amplitude: 2.5 V
Lead Channel Setting Pacing Pulse Width: 0.4 ms
Lead Channel Setting Sensing Sensitivity: 5.6 mV
Zone Setting Status: 755011
Zone Setting Status: 755011

## 2022-02-23 ENCOUNTER — Other Ambulatory Visit: Payer: Self-pay | Admitting: Cardiology

## 2022-03-04 HISTORY — PX: SKIN CANCER EXCISION: SHX779

## 2022-03-14 NOTE — Progress Notes (Signed)
Remote pacemaker transmission.   

## 2022-03-15 NOTE — Progress Notes (Signed)
Cardiology Office Note:    Date:  03/21/2022   ID:  Peggy Mendez, DOB 1927-03-23, MRN [id number]  PCP:  Colon Branch, MD   [facility] Providers Cardiologist:  Castle Lamons [place]ique, MD Electrophysiologist:  Vickie Epley, MD {  Referring MD: Colon Branch, MD   Chief Complaint  Patient presents with   Atrial Fibrillation     History of Present Illness:    Peggy Mendez is a 88 y.o. female with a hx of HTN, HLD, history of syncope, PAF on Eliquis, sick sinus syndrome s/p PPM.  She initially had a Medtronic dual-chamber pacemaker placed in 2008 for complete heart block.  She underwent generator change out in August 2017.  She was noted to be in atrial fibrillation on previous pacemaker interrogation and was placed on Eliquis.  She has a history of statin intolerance and has been placed on Zetia.  She has been staying in atrial fibrillation 100% of the time since the end of February 2022.  Patient has been asymptomatic. Last pacer check November 15 showed good rate control.   Patient presents today for follow-up.  She is doing well from a cardiac standpoint. No chest pain or dyspnea. No palpitations.  No dizziness. BP has been well controlled.   Past Medical History:  Diagnosis Date   Cataract    REMOVED   History of colonic polyps    Hyperlipidemia    Hypertension    Osteopenia    Paroxysmal atrial fibrillation (HCC)    Sick sinus syndrome (HCC)    Skin cancer 2010   Left leg, SCC, sees derm   Syncope 11/2005    Past Surgical History:  Procedure Laterality Date   APPENDECTOMY     BREAST BIOPSY Bilateral    several, 11/2014 showed fibrocystic changes, no evidence of malignancy   BREAST BIOPSY Right 11-2015   cataracts Bilateral    COLONOSCOPY W/ BIOPSIES     EP IMPLANTABLE DEVICE N/A 11/27/2015   MDT Adapta L gen change by Dr Rayann Heman   HEMORRHOID SURGERY     PPM  2008   MDT dual chamber PPM implanted by Dr Verlon Setting for sick sinus and intermittent CHB   TOTAL  ABDOMINAL HYSTERECTOMY     oophorectomy   US ECHOCARDIOGRAPHY  12/08/2005   EF 55-60%    Current Medications: Current Meds  Medication Sig   Calcium Carbonate-Vitamin D (CALCIUM 600/VITAMIN D PO) Take 1 capsule by mouth daily with lunch.   cholecalciferol (VITAMIN D) 1000 units tablet Take 2,000 Units by mouth daily.   ELIQUIS 2.5 MG TABS tablet TAKE 1 TABLET BY MOUTH TWICE  DAILY   ezetimibe (ZETIA) 10 MG tablet TAKE 1 TABLET BY MOUTH DAILY   losartan (COZAAR) 50 MG tablet TAKE 1 TABLET BY MOUTH DAILY   metoprolol tartrate (LOPRESSOR) 25 MG tablet TAKE ONE-HALF TABLET BY MOUTH  TWICE DAILY   Misc Natural Products (TART CHERRY ADVANCED) CAPS Take 1 capsule by mouth at bedtime.   multivitamin (THERAGRAN) per tablet Take 1 tablet by mouth daily.   Omega-3 Fatty Acids (FISH OIL) 1200 MG CAPS Take 1,200 mg by mouth daily with lunch.     Allergies:   Anesthetics, ester; Lisinopril; and Other   Social History   Socioeconomic History   Marital status: Single    Spouse name: Not on file   Number of children: 0   Years of education: Not on file   Highest education level: Not on file  Occupational History  Occupation: retired     Fish farm manager: RETIRED  Tobacco Use   Smoking status: Former    Types: Cigarettes    Quit date: 08/23/1988    Years since quitting: 33.5   Smokeless tobacco: Never   Tobacco comments:    >20 yrs ago, used to smoke ~ 1 ppd  Vaping Use   Vaping Use: Never used  Substance and Sexual Activity   Alcohol use: Yes    Comment: socially   Drug use: Yes    Types: IV   Sexual activity: Not on file  Other Topics Concern   Not on file  Social History Narrative   Lives by herself, in a condominium,  still drives.   Lost her  twin sister 12/2019   Has  extended family members checking on her   Brother in law passed 11-2020       Social Determinants of Health   Financial Resource Strain: Not on file  Food Insecurity: Not on file  Transportation Needs: Not on file   Physical Activity: Not on file  Stress: Not on file  Social Connections: Not on file     Family History: The patient's family history includes Breast cancer (age of onset: 76) in her sister; Coronary artery disease in an other family member; Heart disease in her father; Heart failure in her mother; Hypertension in her sister; Prostate cancer in her brother. There is no history of Colon cancer or Diabetes.  ROS:   Please see the history of present illness.     All other systems reviewed and are negative.  EKGs/Labs/Other Studies Reviewed:    The following studies were reviewed today:  N/A  EKG:  EKG is not ordered today.     Recent Labs: 09/11/2021: ALT 23; BUN 25; Creatinine, Ser 0.77; Hemoglobin 14.0; Platelets 210; Potassium 4.3; Sodium 137 12/07/2021: TSH 2.78  Recent Lipid Panel    Component Value Date/Time   CHOL 197 12/07/2021 1117   CHOL 220 (H) 12/01/2017 1100   TRIG 64.0 12/07/2021 1117   TRIG 62 06/17/2009 0000   HDL 84.40 12/07/2021 1117   HDL 108 12/01/2017 1100   CHOLHDL 2 12/07/2021 1117   VLDL 12.8 12/07/2021 1117   LDLCALC 100 (H) 12/07/2021 1117   LDLCALC 101 (H) 12/01/2017 1100   LDLDIRECT 105.8 09/07/2012 0843     Risk Assessment/Calculations:       Physical Exam:    VS:  BP 116/70   Pulse 90   Ht '5\' 3"'$  (1.6 m)   Wt 116 lb 12.8 oz (53 kg)   SpO2 98%   BMI 20.69 kg/m     Wt Readings from Last 3 Encounters:  03/21/22 116 lb 12.8 oz (53 kg)  12/07/21 121 lb (54.9 kg)  09/17/21 119 lb (54 kg)     GEN:  Well nourished, thin in no acute distress HEENT: Normal NECK: No JVD; No carotid bruits LYMPHATICS: No lymphadenopathy CARDIAC: RRR, no murmurs, rubs, gallops RESPIRATORY:  Clear to auscultation without rales, wheezing or rhonchi  ABDOMEN: Soft, non-tender, non-distended MUSCULOSKELETAL:  No edema; No deformity  SKIN: Warm and dry NEUROLOGIC:  Alert and oriented x 3 PSYCHIATRIC:  Normal affect   ASSESSMENT:    1. Permanent atrial  fibrillation ([place])   2. Heart block AV complete ([place])   3. Essential hypertension   4. Pacemaker       PLAN:    In order of problems listed above:  Persistent atrial fibrillation: Has been in 100% atrial fibrillation  since Feb 2022.  She has no cardiac awareness of atrial fibrillation, heart rate is well controlled.   We will continue with rate control strategy.  Hypertension: Blood pressure stable  Hyperlipidemia: Continue Zetia and fish oil  History of pacemaker: Followed by EP service.- recent pacer check on 02/16/22 was normal. Now in VVI mode.   Follow  up in 6 months  Medication Adjustments/Labs and Tests Ordered: Current medicines are reviewed at length with the patient today.  Concerns regarding medicines are outlined above.  No orders of the defined types were placed in this encounter.   No orders of the defined types were placed in this encounter.    There are no Patient Instructions on file for this visit.   Signed, Elani Delph [place], MD  03/21/2022 11:13 AM    [facility]

## 2022-03-21 ENCOUNTER — Ambulatory Visit: Payer: Medicare Other | Attending: Cardiology | Admitting: Cardiology

## 2022-03-21 ENCOUNTER — Encounter: Payer: Self-pay | Admitting: Cardiology

## 2022-03-21 VITALS — BP 116/70 | HR 90 | Ht 63.0 in | Wt 116.8 lb

## 2022-03-21 DIAGNOSIS — I10 Essential (primary) hypertension: Secondary | ICD-10-CM

## 2022-03-21 DIAGNOSIS — Z950 Presence of cardiac pacemaker: Secondary | ICD-10-CM | POA: Diagnosis not present

## 2022-03-21 DIAGNOSIS — I442 Atrioventricular block, complete: Secondary | ICD-10-CM

## 2022-03-21 DIAGNOSIS — I4821 Permanent atrial fibrillation: Secondary | ICD-10-CM | POA: Diagnosis not present

## 2022-04-01 ENCOUNTER — Encounter: Payer: Medicare Other | Admitting: Internal Medicine

## 2022-04-06 ENCOUNTER — Encounter: Payer: Self-pay | Admitting: Internal Medicine

## 2022-04-06 ENCOUNTER — Ambulatory Visit (INDEPENDENT_AMBULATORY_CARE_PROVIDER_SITE_OTHER): Payer: Medicare Other | Admitting: Internal Medicine

## 2022-04-06 VITALS — BP 126/76 | HR 93 | Temp 98.1°F | Resp 18 | Ht 63.25 in | Wt 118.0 lb

## 2022-04-06 DIAGNOSIS — E7800 Pure hypercholesterolemia, unspecified: Secondary | ICD-10-CM

## 2022-04-06 DIAGNOSIS — I10 Essential (primary) hypertension: Secondary | ICD-10-CM

## 2022-04-06 DIAGNOSIS — Z23 Encounter for immunization: Secondary | ICD-10-CM | POA: Diagnosis not present

## 2022-04-06 DIAGNOSIS — R739 Hyperglycemia, unspecified: Secondary | ICD-10-CM | POA: Diagnosis not present

## 2022-04-06 DIAGNOSIS — Z0000 Encounter for general adult medical examination without abnormal findings: Secondary | ICD-10-CM | POA: Diagnosis not present

## 2022-04-06 LAB — CBC WITH DIFFERENTIAL/PLATELET
Basophils Absolute: 0.1 10*3/uL (ref 0.0–0.1)
Basophils Relative: 0.9 % (ref 0.0–3.0)
Eosinophils Absolute: 0.1 10*3/uL (ref 0.0–0.7)
Eosinophils Relative: 1.3 % (ref 0.0–5.0)
HCT: 42.2 % (ref 36.0–46.0)
Hemoglobin: 14.1 g/dL (ref 12.0–15.0)
Lymphocytes Relative: 18.5 % (ref 12.0–46.0)
Lymphs Abs: 1.6 10*3/uL (ref 0.7–4.0)
MCHC: 33.4 g/dL (ref 30.0–36.0)
MCV: 86.7 fl (ref 78.0–100.0)
Monocytes Absolute: 0.9 10*3/uL (ref 0.1–1.0)
Monocytes Relative: 9.7 % (ref 3.0–12.0)
Neutro Abs: 6.1 10*3/uL (ref 1.4–7.7)
Neutrophils Relative %: 69.6 % (ref 43.0–77.0)
Platelets: 250 10*3/uL (ref 150.0–400.0)
RBC: 4.86 Mil/uL (ref 3.87–5.11)
RDW: 14 % (ref 11.5–15.5)
WBC: 8.8 10*3/uL (ref 4.0–10.5)

## 2022-04-06 LAB — BASIC METABOLIC PANEL
BUN: 17 mg/dL (ref 6–23)
CO2: 29 mEq/L (ref 19–32)
Calcium: 9.8 mg/dL (ref 8.4–10.5)
Chloride: 100 mEq/L (ref 96–112)
Creatinine, Ser: 0.82 mg/dL (ref 0.40–1.20)
GFR: 61.59 mL/min (ref >60.00)
Glucose, Bld: 98 mg/dL (ref 70–99)
Potassium: 5 mEq/L (ref 3.5–5.1)
Sodium: 139 mEq/L (ref 135–145)

## 2022-04-06 LAB — HEMOGLOBIN A1C: Hgb A1c MFr Bld: 6.1 % (ref 4.6–6.5)

## 2022-04-06 NOTE — Assessment & Plan Note (Signed)
Here for CPX  HTN: BP is very good, rec amb BPs, continue losartan, metoprolol.  Check a BMP and CBC Hyperlipidemia: On Zetia, last FLP okay.   Permanent A-fib, pacemaker: Cardiology visit 03/21/2022, felt to be stable Osteopenia: Not checking  DEXAs, the patient has declined treatment consistently. Social: Still lives independent and drives.   RTC 6 months

## 2022-04-06 NOTE — Assessment & Plan Note (Signed)
-  Td  2016 -PNM 23: 2005, 02-2017; PNM 13: 2015; PNM 20: 2024 - s/p shingles- Shingrix  - RSV d/w pt   -COVID vax:rec a booster  - Had a flu shot -no further screening for cervical ca -h/o abnormal MMG and breast Bx; breast bx neg 11-2014;  MMG 07-2021, pt plans to cont MMGs -CCS: Cscope 8-09, 2 polyps;  Cscope 8-14, + polyps, no further scopes  -Abd u/s 2013 (-) for AAA  -Labs: BMP A1c CBC is -Advance directives: Recommend to bring a copy -Fall prevention discussed

## 2022-04-06 NOTE — Patient Instructions (Addendum)
Vaccines I recommend:  Covid booster RSV vaccine  Please bring [place] a copy of your Healthcare Power of Attorney for your chart.    Check the  blood pressure regularly BP GOAL is between 110/65 and  135/85. If it is consistently higher or lower, let me know     GO TO THE LAB : Get the blood work     [place], [place] back for   a checkup in 6 months  Fall Prevention in the Home, Adult Falls can cause injuries and affect people of all ages. There are many simple things that you can do to make your home safe and to help prevent falls. Ask for help when making these changes, if needed. What actions can I take to prevent falls? General instructions Use good lighting in all rooms. Replace any light bulbs that burn out, turn on lights if it is dark, and use night-lights. Place frequently used items in easy-to-reach places. Lower the shelves around your home if necessary. Set up furniture so that there are clear paths around it. Avoid moving your furniture around. Remove throw rugs and other tripping hazards from the floor. Avoid walking on wet floors. Fix any uneven floor surfaces. Add color or contrast paint or tape to grab bars and handrails in your home. Place contrasting color strips on the first and last steps of staircases. When you use a stepladder, make sure that it is completely opened and that the sides and supports are firmly locked. Have someone hold the ladder while you are using it. Do not climb a closed stepladder. Know where your pets are when moving through your home. What can I do in the bathroom?     Keep the floor dry. Immediately clean up any water that is on the floor. Remove soap buildup in the tub or shower regularly. Use nonskid mats or decals on the floor of the tub or shower. Attach bath mats securely with double-sided, nonslip rug tape. If you need to sit down while you are in the shower, use a plastic, nonslip  stool. Install grab bars by the toilet and in the tub and shower. Do not use towel bars as grab bars. What can I do in the bedroom? Make sure that a bedside light is easy to reach. Do not use oversized bedding that reaches the floor. Have a firm chair that has side arms to use for getting dressed. What can I do in the kitchen? Clean up any spills right away. If you need to reach for something above you, use a sturdy step stool that has a grab bar. Keep electrical cables out of the way. Do not use floor polish or wax that makes floors slippery. If you must use wax, make sure that it is non-skid floor wax. What can I do with my stairs? Do not leave any items on the stairs. Make sure that you have a light switch at the top and the bottom of the stairs. Have them installed if you do not have them. Make sure that there are handrails on both sides of the stairs. Fix handrails that are broken or loose. Make sure that handrails are as long as the staircases. Install non-slip stair treads on all stairs in your home. Avoid having throw rugs at the top or bottom of stairs, or secure the rugs with carpet tape to prevent them from moving. Choose a carpet design that does not hide the edge of steps  on the stairs. Check any carpeting to make sure that it is firmly attached to the stairs. Fix any carpet that is loose or worn. What can I do on the outside of my home? Use bright outdoor lighting. Regularly repair the edges of walkways and driveways and fix any cracks. Remove high doorway thresholds. Trim any shrubbery on the main path into your home. Regularly check that handrails are securely fastened and in good repair. Both sides of all steps should have handrails. Install guardrails along the edges of any raised decks or porches. Clear walkways of debris and clutter, including tools and rocks. Have leaves, snow, and ice cleared regularly. Use sand or salt on walkways during winter months. In the  garage, clean up any spills right away, including grease or oil spills. What other actions can I take? Wear closed-toe shoes that fit well and support your feet. Wear shoes that have rubber soles or low heels. Use mobility aids as needed, such as canes, walkers, scooters, and crutches. Review your medicines with your health care provider. Some medicines can cause dizziness or changes in blood pressure, which increase your risk of falling. Talk with your health care provider about other ways that you can decrease your risk of falls. This may include working with a physical therapist or trainer to improve your strength, balance, and endurance. Where to find more information Centers for Disease Control and Prevention, [employer]: http://www.wolf.info/ National Institute on Aging: http://kim-miller.com/ Contact a health care provider if: You are afraid of falling at home. You feel weak, drowsy, or dizzy at home. You fall at home. Summary There are many simple things that you can do to make your home safe and to help prevent falls. Ways to make your home safe include removing tripping hazards and installing grab bars in the bathroom. Ask for help when making these changes in your home. This information is not intended to replace advice given to you by your health care provider. Make sure you discuss any questions you have with your health care provider. Document Revised: 12/21/2020 Document Reviewed: 10/23/2019 Elsevier Patient Education  [place].

## 2022-04-06 NOTE — Progress Notes (Signed)
Subjective:    Patient ID: Peggy Mendez, female    DOB: [birth year], 89 y.o.   MRN: [id number]  DOS:  04/06/2022 Type of visit - description: cpx  Since the last office visit is doing well. Had an episode of cough in November, that is largely resolved. Denies any GI or GU symptoms. Does get slightly dizzy at times when she changes positions.    Review of Systems  Other than above, a 14 point review of systems is negative      Past Medical History:  Diagnosis Date   Cataract    REMOVED   History of colonic polyps    Hyperlipidemia    Hypertension    Osteopenia    Paroxysmal atrial fibrillation (HCC)    Sick sinus syndrome (HCC)    Skin cancer 2010   Left leg, SCC, sees derm   Syncope 11/2005    Past Surgical History:  Procedure Laterality Date   APPENDECTOMY     BREAST BIOPSY Bilateral    several, 11/2014 showed fibrocystic changes, no evidence of malignancy   BREAST BIOPSY Right 11/2015   cataracts Bilateral    COLONOSCOPY W/ BIOPSIES     EP IMPLANTABLE DEVICE N/A 11/27/2015   MDT Adapta L gen change by Dr Rayann Heman   HEMORRHOID SURGERY     PPM  2008   MDT dual chamber PPM implanted by Dr Verlon Setting for sick sinus and intermittent CHB   SKIN CANCER EXCISION Right 03/2022   R face   TOTAL ABDOMINAL HYSTERECTOMY     oophorectomy   US ECHOCARDIOGRAPHY  12/08/2005   EF 55-60%   Social History   Socioeconomic History   Marital status: Single    Spouse name: Not on file   Number of children: 0   Years of education: Not on file   Highest education level: Not on file  Occupational History   Occupation: retired     Fish farm manager: RETIRED  Tobacco Use   Smoking status: Former    Types: Cigarettes    Quit date: 08/23/1988    Years since quitting: 33.6   Smokeless tobacco: Never   Tobacco comments:    >20 yrs ago, used to smoke ~ 1 ppd  Vaping Use   Vaping Use: Never used  Substance and Sexual Activity   Alcohol use: Yes    Comment: socially   Drug use: Yes     Types: IV   Sexual activity: Not on file  Other Topics Concern   Not on file  Social History Narrative   Lives by herself, in a condominium,  still drives.   Lost her  twin sister 12/2019   Has  extended family members checking on her   Brother in law passed 11-2020       Social Determinants of Health   Financial Resource Strain: Not on file  Food Insecurity: Not on file  Transportation Needs: Not on file  Physical Activity: Not on file  Stress: Not on file  Social Connections: Not on file  Intimate Partner Violence: Not on file     Current Outpatient Medications  Medication Instructions   Calcium Carbonate-Vitamin D (CALCIUM 600/VITAMIN D PO) 1 capsule, Oral, Daily with lunch   cholecalciferol (VITAMIN D) 2,000 Units, Oral, Daily   ELIQUIS 2.5 MG TABS tablet TAKE 1 TABLET BY MOUTH TWICE  DAILY   ezetimibe (ZETIA) 10 MG tablet TAKE 1 TABLET BY MOUTH DAILY   Fish Oil 1,200 mg, Oral, Daily with lunch  losartan (COZAAR) 50 MG tablet TAKE 1 TABLET BY MOUTH DAILY   metoprolol tartrate (LOPRESSOR) 12.5 mg, Oral, 2 times daily   Misc Natural Products (TART CHERRY ADVANCED) CAPS 1 capsule, Oral, Daily at bedtime   multivitamin (THERAGRAN) per tablet 1 tablet, Oral, Daily,         Objective:   Physical Exam BP 126/76   Pulse 93   Temp 98.1 F (36.7 C) (Oral)   Resp 18   Ht 5' 3.25" (1.607 m)   Wt 118 lb (53.5 kg)   SpO2 98%   BMI 20.74 kg/m  General: Well developed, NAD, BMI noted Neck: No  thyromegaly  HEENT:  Normocephalic . Face symmetric, atraumatic Lungs:  CTA B Normal respiratory effort, no intercostal retractions, no accessory muscle use. Heart: Irregularly irregular Abdomen:  Not distended, soft, non-tender. No rebound or rigidity.   Lower extremities: no pretibial edema bilaterally  Skin: Exposed areas without rash. Not pale. Not jaundice Neurologic:  alert & oriented X3.  Speech normal, gait assisted by cane, needs help transferring Strength symmetric  and appropriate for age.  Psych: Cognition and judgment appear intact.  Cooperative with normal attention span and concentration.  Behavior appropriate. No anxious or depressed appearing.     Assessment    Assessment  HTN Hyperlipidemia.  Lipitor DC'd by cardiology due to aches and pains CV: -- Permanent A. Fib >> ELIQUIS --Pacemaker for Mobitz 2 --Syncope 2007, ECHO aortic sclerosis, saw cardiology Osteopenia DEA 08-2006 and 10-2008 -----T score at the spine normal. DEXA 02-2011 T score of the hips was -2.0, -2.1, mild osteopenia. Declined rx as of 02/2018  DJD  Skin cancer, SCC, left leg, sees dermatology FH breast ca -sister, brother CHF  PLAN Here for CPX  HTN: BP is very good, rec amb BPs, continue losartan, metoprolol.  Check a BMP and CBC Hyperlipidemia: On Zetia, last FLP okay.   Permanent A-fib, pacemaker: Cardiology visit 03/21/2022, felt to be stable Osteopenia: Not checking  DEXAs, the patient has declined treatment consistently. Social: Still lives independent and drives.   RTC 6 months

## 2022-05-18 ENCOUNTER — Ambulatory Visit (INDEPENDENT_AMBULATORY_CARE_PROVIDER_SITE_OTHER): Payer: Medicare Other

## 2022-05-18 DIAGNOSIS — I442 Atrioventricular block, complete: Secondary | ICD-10-CM | POA: Diagnosis not present

## 2022-05-22 LAB — CUP PACEART REMOTE DEVICE CHECK
Battery Impedance: 403 Ohm
Battery Remaining Longevity: 110 mo
Battery Voltage: 2.79 V
Brady Statistic RV Percent Paced: 32 %
Date Time Interrogation Session: 20240216154447
Implantable Lead Connection Status: 753985
Implantable Lead Connection Status: 753985
Implantable Lead Implant Date: 20080318
Implantable Lead Implant Date: 20080318
Implantable Lead Location: 753859
Implantable Lead Location: 753860
Implantable Lead Model: 5076
Implantable Lead Model: 5076
Implantable Pulse Generator Implant Date: 20170825
Lead Channel Impedance Value: 555 Ohm
Lead Channel Impedance Value: 67 Ohm
Lead Channel Pacing Threshold Amplitude: 1.125 V
Lead Channel Pacing Threshold Pulse Width: 0.4 ms
Lead Channel Setting Pacing Amplitude: 2.5 V
Lead Channel Setting Pacing Pulse Width: 0.4 ms
Lead Channel Setting Sensing Sensitivity: 5.6 mV
Zone Setting Status: 755011
Zone Setting Status: 755011

## 2022-05-30 ENCOUNTER — Other Ambulatory Visit: Payer: Self-pay | Admitting: Internal Medicine

## 2022-05-30 DIAGNOSIS — Z1231 Encounter for screening mammogram for malignant neoplasm of breast: Secondary | ICD-10-CM

## 2022-06-16 NOTE — Progress Notes (Signed)
Remote pacemaker transmission.   

## 2022-07-13 ENCOUNTER — Ambulatory Visit
Admission: RE | Admit: 2022-07-13 | Discharge: 2022-07-13 | Disposition: A | Discharge status: Home / Self Care | Payer: Medicare Other | Source: Ambulatory Visit | Attending: Internal Medicine | Admitting: Internal Medicine

## 2022-07-13 DIAGNOSIS — Z1231 Encounter for screening mammogram for malignant neoplasm of breast: Secondary | ICD-10-CM

## 2022-07-15 ENCOUNTER — Other Ambulatory Visit: Payer: Self-pay | Admitting: Internal Medicine

## 2022-07-15 DIAGNOSIS — R928 Other abnormal and inconclusive findings on diagnostic imaging of breast: Secondary | ICD-10-CM

## 2022-07-21 ENCOUNTER — Ambulatory Visit
Admission: RE | Admit: 2022-07-21 | Discharge: 2022-07-21 | Disposition: A | Discharge status: Home / Self Care | Payer: Medicare Other | Source: Ambulatory Visit | Attending: Internal Medicine | Admitting: Internal Medicine

## 2022-07-21 DIAGNOSIS — R928 Other abnormal and inconclusive findings on diagnostic imaging of breast: Secondary | ICD-10-CM

## 2022-08-17 ENCOUNTER — Ambulatory Visit (INDEPENDENT_AMBULATORY_CARE_PROVIDER_SITE_OTHER): Payer: Medicare Other

## 2022-08-17 DIAGNOSIS — I442 Atrioventricular block, complete: Secondary | ICD-10-CM

## 2022-08-18 LAB — CUP PACEART REMOTE DEVICE CHECK
Battery Impedance: 427 Ohm
Battery Remaining Longevity: 106 mo
Battery Voltage: 2.79 V
Brady Statistic RV Percent Paced: 32 %
Date Time Interrogation Session: 20240515112237
Implantable Lead Connection Status: 753985
Implantable Lead Connection Status: 753985
Implantable Lead Implant Date: 20080318
Implantable Lead Implant Date: 20080318
Implantable Lead Location: 753859
Implantable Lead Location: 753860
Implantable Lead Model: 5076
Implantable Lead Model: 5076
Implantable Pulse Generator Implant Date: 20170825
Lead Channel Impedance Value: 483 Ohm
Lead Channel Impedance Value: 67 Ohm
Lead Channel Pacing Threshold Amplitude: 1 V
Lead Channel Pacing Threshold Pulse Width: 0.4 ms
Lead Channel Setting Pacing Amplitude: 2.5 V
Lead Channel Setting Pacing Pulse Width: 0.4 ms
Lead Channel Setting Sensing Sensitivity: 5.6 mV
Zone Setting Status: 755011
Zone Setting Status: 755011

## 2022-09-01 NOTE — Progress Notes (Signed)
Remote pacemaker transmission.   

## 2022-09-05 ENCOUNTER — Encounter: Payer: Self-pay | Admitting: Cardiology

## 2022-09-05 ENCOUNTER — Ambulatory Visit: Payer: Medicare Other | Attending: Cardiology | Admitting: Cardiology

## 2022-09-05 VITALS — BP 114/74 | HR 91 | Ht 63.0 in | Wt 118.8 lb

## 2022-09-05 DIAGNOSIS — Z950 Presence of cardiac pacemaker: Secondary | ICD-10-CM | POA: Diagnosis not present

## 2022-09-05 DIAGNOSIS — I4821 Permanent atrial fibrillation: Secondary | ICD-10-CM

## 2022-09-05 DIAGNOSIS — I442 Atrioventricular block, complete: Secondary | ICD-10-CM | POA: Diagnosis not present

## 2022-09-05 NOTE — Patient Instructions (Signed)
Medication Instructions:  Continue same medications *If you need a refill on your cardiac medications before your next appointment, please call your pharmacy*   Lab Work: None ordered    Testing/Procedures: None ordered   Follow-Up: At [facility], you and your health needs are our priority.  As part of our continuing mission to provide you with exceptional heart care, we have created designated Provider Care Teams.  These Care Teams include your primary Cardiologist (physician) and Advanced Practice Providers (APPs -  Physician Assistants and Nurse Practitioners) who all work together to provide you with the care you need, when you need it.  We recommend signing up for the patient portal called "MyChart".  Sign up information is provided on this After Visit Summary.  MyChart is used to connect with patients for Virtual Visits (Telemedicine).  Patients are able to view lab/test results, encounter notes, upcoming appointments, etc.  Non-urgent messages can be sent to your provider as well.   To learn more about what you can do with MyChart, go to [email or web address].    Your next appointment:  6 months    Provider:  Dr.Jordan   

## 2022-09-05 NOTE — Progress Notes (Signed)
Cardiology Office Note:    Date:  09/05/2022   ID:  Peggy Mendez, DOB 03-14-1927, MRN [id number]  PCP:  Peggy Plump, MD   [facility] Providers Cardiologist:  Peggy Vanacker [place], MD Electrophysiologist:  Peggy Prude, MD {  Referring MD: Peggy Plump, MD   Chief Complaint  Patient presents with   Atrial Fibrillation     History of Present Illness:    Peggy Mendez is a 89 y.o. female with a hx of HTN, HLD, history of syncope, PAF on Eliquis, sick sinus syndrome s/p PPM.  She initially had a Medtronic dual-chamber pacemaker placed in 2008 for complete heart block.  She underwent generator change out in August 2017.  She was noted to be in atrial fibrillation on previous pacemaker interrogation and was placed on Eliquis.  She has a history of statin intolerance and has been on Zetia.  She has been staying in atrial fibrillation 100% of the time since the end of February 2022.  She is asymptomatic. Last pacer check May 15 showed good rate control.   Patient presents today for follow-up.  She is doing well from a cardiac standpoint. No chest pain or dyspnea. No palpitations.  No dizziness. BP has been well controlled. She does have occasional numbness in her arms at night.   Past Medical History:  Diagnosis Date   Cataract    REMOVED   History of colonic polyps    Hyperlipidemia    Hypertension    Osteopenia    Paroxysmal atrial fibrillation Peggy Mendez)    Sick sinus syndrome (HCC)    Skin cancer 2010   Left leg, SCC, sees derm   Syncope 11/2005    Past Surgical History:  Procedure Laterality Date   APPENDECTOMY     BREAST BIOPSY Bilateral    several, 11/2014 showed fibrocystic changes, no evidence of malignancy   BREAST BIOPSY Right 11/2015   cataracts Bilateral    COLONOSCOPY W/ BIOPSIES     EP IMPLANTABLE DEVICE N/A 11/27/2015   MDT Adapta L gen change by Dr Johney Frame   HEMORRHOID SURGERY     PPM  2008   MDT dual chamber PPM implanted by Dr Reyes Ivan for sick sinus  and intermittent CHB   SKIN CANCER EXCISION Right 03/2022   R face   TOTAL ABDOMINAL HYSTERECTOMY     oophorectomy   US ECHOCARDIOGRAPHY  12/08/2005   EF 55-60%    Current Medications: Current Meds  Medication Sig   Calcium Carbonate-Vitamin D (CALCIUM 600/VITAMIN D PO) Take 1 capsule by mouth daily with lunch.   cholecalciferol (VITAMIN D) 1000 units tablet Take 2,000 Units by mouth daily.   ELIQUIS 2.5 MG TABS tablet TAKE 1 TABLET BY MOUTH TWICE  DAILY   ezetimibe (ZETIA) 10 MG tablet TAKE 1 TABLET BY MOUTH DAILY   losartan (COZAAR) 50 MG tablet TAKE 1 TABLET BY MOUTH DAILY   metoprolol tartrate (LOPRESSOR) 25 MG tablet TAKE ONE-HALF TABLET BY MOUTH  TWICE DAILY   Misc Natural Products (TART CHERRY ADVANCED) CAPS Take 1 capsule by mouth at bedtime.   multivitamin (THERAGRAN) per tablet Take 1 tablet by mouth daily.   Omega-3 Fatty Acids (FISH OIL) 1200 MG CAPS Take 1,200 mg by mouth daily with lunch.     Allergies:   Anesthetics, ester; Lisinopril; and Other   Social History   Socioeconomic History   Marital status: Single    Spouse name: Not on file   Number of children: 0  Years of education: Not on file   Highest education level: Not on file  Occupational History   Occupation: retired     Associate Professor: RETIRED  Tobacco Use   Smoking status: Former    Types: Cigarettes    Quit date: 08/23/1988    Years since quitting: 34.0   Smokeless tobacco: Never   Tobacco comments:    >20 yrs ago, used to smoke ~ 1 ppd  Vaping Use   Vaping Use: Never used  Substance and Sexual Activity   Alcohol use: Yes    Comment: socially   Drug use: Yes    Types: IV   Sexual activity: Not on file  Other Topics Concern   Not on file  Social History Narrative   Lives by herself, in a condominium,  still drives.   Lost her  twin sister 12/2019   Has  extended family members checking on her   Brother in law passed 11-2020       Social Determinants of Health   Financial Resource Strain:  Not on file  Food Insecurity: Not on file  Transportation Needs: Not on file  Physical Activity: Not on file  Stress: Not on file  Social Connections: Not on file     Family History: The patient's family history includes Breast cancer (age of onset: 102) in her sister; Coronary artery disease in an other family member; Heart disease in her father; Heart failure in her mother; Hypertension in her sister; Prostate cancer in her brother. There is no history of Colon cancer or Diabetes.  ROS:   Please see the history of present illness.     All other systems reviewed and are negative.  EKGs/Labs/Other Studies Reviewed:    The following studies were reviewed today:  N/A  EKG:  EKG is not ordered today.     Recent Labs: 09/11/2021: ALT 23 12/07/2021: TSH 2.78 04/06/2022: BUN 17; Creatinine, Ser 0.82; Hemoglobin 14.1; Platelets 250.0; Potassium 5.0; Sodium 139  Recent Lipid Panel    Component Value Date/Time   CHOL 197 12/07/2021 1117   CHOL 220 (H) 12/01/2017 1100   TRIG 64.0 12/07/2021 1117   TRIG 62 06/17/2009 0000   HDL 84.40 12/07/2021 1117   HDL 108 12/01/2017 1100   CHOLHDL 2 12/07/2021 1117   VLDL 12.8 12/07/2021 1117   LDLCALC 100 (H) 12/07/2021 1117   LDLCALC 101 (H) 12/01/2017 1100   LDLDIRECT 105.8 09/07/2012 0843     Risk Assessment/Calculations:       Physical Exam:    VS:  Pulse 91   Ht 5\' 3"  (1.6 m)   Wt 118 lb 12.8 oz (53.9 kg)   SpO2 95%   BMI 21.04 kg/m     Wt Readings from Last 3 Encounters:  09/05/22 118 lb 12.8 oz (53.9 kg)  04/06/22 118 lb (53.5 kg)  03/21/22 116 lb 12.8 oz (53 kg)     GEN:  Well nourished, thin in no acute distress HEENT: Normal NECK: No JVD; No carotid bruits LYMPHATICS: No lymphadenopathy CARDIAC: RRR, no murmurs, rubs, gallops RESPIRATORY:  Clear to auscultation without rales, wheezing or rhonchi  ABDOMEN: Soft, non-tender, non-distended MUSCULOSKELETAL:  No edema; No deformity  SKIN: Warm and dry NEUROLOGIC:   Alert and oriented x 3 PSYCHIATRIC:  Normal affect   ASSESSMENT:    1. Permanent atrial fibrillation (HCC)   2. Heart block AV complete (HCC)   3. Pacemaker        PLAN:    In order  of problems listed above:  Persistent atrial fibrillation: Has been in 100% atrial fibrillation since Feb 2022.  She has no cardiac awareness of atrial fibrillation, heart rate is well controlled.   We will continue with rate control strategy an anticoagulation  Hypertension: Blood pressure stable  Hyperlipidemia: Continue Zetia and fish oil  History of pacemaker: Followed by EP service.- Now in VVI mode. Recent pacer check normal.  Follow  up in 6 months  Medication Adjustments/Labs and Tests Ordered: Current medicines are reviewed at length with the patient today.  Concerns regarding medicines are outlined above.  No orders of the defined types were placed in this encounter.   No orders of the defined types were placed in this encounter.    There are no Patient Instructions on file for this visit.   Signed, Kirkland Figg [place], MD  09/05/2022 1:22 PM     [facility]

## 2022-09-14 ENCOUNTER — Encounter: Payer: Self-pay | Admitting: Cardiology

## 2022-09-14 ENCOUNTER — Ambulatory Visit: Payer: Medicare Other | Attending: Cardiology | Admitting: Cardiology

## 2022-09-14 VITALS — BP 120/62 | HR 92 | Ht 63.0 in | Wt 120.6 lb

## 2022-09-14 DIAGNOSIS — I4821 Permanent atrial fibrillation: Secondary | ICD-10-CM

## 2022-09-14 DIAGNOSIS — Z950 Presence of cardiac pacemaker: Secondary | ICD-10-CM

## 2022-09-14 DIAGNOSIS — I442 Atrioventricular block, complete: Secondary | ICD-10-CM

## 2022-09-14 NOTE — Progress Notes (Addendum)
  Electrophysiology Office Follow up Visit Note:    Date:  09/14/2022   ID:  Peggy Mendez, DOB 1926/09/14, MRN [id number]  PCP:  Wanda Plump, MD  [facility] Cardiologist:  Peter [place], MD  [facility] Electrophysiologist:  Lanier Prude, MD    Interval History:    Peggy Mendez is a 89 y.o. female who presents for a follow up visit. At her last appointment 08/2021 we reprogrammed her pacemaker to VVIR given permanent A-fib.   She was last seen in cardiology by Dr. [place] 09/05/2022. At that time she was doing well from a cardiac standpoint. She did complain of occasional arm numbness at night. Pacemaker was in VVI mode. Continued with rate control strategy and anticoagulation.  Today, she states she is feeling well. She doesn't have as much energy as she used to, but she remains active and denies being limited with her ADL's even if she needs to move slowly and carefully. She does her own shopping. Goes up and down stairs frequently without significant symptoms.  Usually her home weight is stable around 118-120 lbs. Her blood pressure is well controlled in the office today.  She denies any palpitations, chest pain, shortness of breath, peripheral edema, lightheadedness, headaches, syncope, orthopnea, or PND.     Past medical, surgical, social, and family history were reviewed.  ROS:   Please see the history of present illness.    All other systems reviewed and are negative.  EKGs/Labs/Other Studies Reviewed:    The following studies were reviewed today:  09/14/2022 In clinic device interrogation personally reviewed: Battery longevity 8.5 years Lead parameter stable 32.8% pacing 67.2% sensing Programmed VVIR 60-1 30   Physical Exam:    VS:  BP 120/62   Pulse 92   Ht 5\' 3"  (1.6 m)   Wt 120 lb 9.6 oz (54.7 kg)   SpO2 98%   BMI 21.36 kg/m     Wt Readings from Last 3 Encounters:  09/14/22 120 lb 9.6 oz (54.7 kg)  09/05/22 118 lb 12.8 oz (53.9 kg)   04/06/22 118 lb (53.5 kg)     GEN: Well nourished, well developed in no acute distress CARDIAC: RRR, no murmurs, rubs, gallops. PPM pocket well healed. RESPIRATORY:  Clear to auscultation without rales, wheezing or rhonchi       ASSESSMENT:    1. Permanent atrial fibrillation (HCC)   2. Heart block AV complete (HCC)   3. Pacemaker    PLAN:    #Permanent atrial fibrillation On Eliquis for stroke prophylaxis  #Complete heart block #Permanent pacemaker in situ Device functioning appropriately.  Continue remote monitoring.  Follow-up 1 year with APP.   I,Mathew Stumpf,acting as a Neurosurgeon for Lanier Prude, MD.,have documented all relevant documentation on the behalf of Lanier Prude, MD,as directed by  Lanier Prude, MD while in the presence of Lanier Prude, MD.  I, Lanier Prude, MD, have reviewed all documentation for this visit. The documentation on 09/14/22 for the exam, diagnosis, procedures, and orders are all accurate and complete.   Signed, Steffanie Dunn, MD, [facility], [facility] 09/14/2022 9:58 PM    Electrophysiology [facility]

## 2022-09-14 NOTE — Patient Instructions (Signed)
Medication Instructions:  Your physician recommends that you continue on your current medications as directed. Please refer to the Current Medication list given to you today.  *If you need a refill on your cardiac medications before your next appointment, please call your pharmacy*  Follow-Up: At [facility], you and your health needs are our priority.  As part of our continuing mission to provide you with exceptional heart care, we have created designated Provider Care Teams.  These Care Teams include your primary Cardiologist (physician) and Advanced Practice Providers (APPs -  Physician Assistants and Nurse Practitioners) who all work together to provide you with the care you need, when you need it.  Your next appointment:   1 year(s)  Provider:   You may see CAMERON T LAMBERT, MD or one of the following Advanced Practice Providers on your designated Care Team:   Renee Ursuy, PA-C Michael "Andy" Tillery, PA-C Suzann Riddle, NP     

## 2022-09-28 ENCOUNTER — Encounter: Payer: Medicare Other | Admitting: Cardiology

## 2022-10-05 ENCOUNTER — Ambulatory Visit: Payer: Medicare Other | Admitting: Internal Medicine

## 2022-10-05 ENCOUNTER — Encounter: Payer: Self-pay | Admitting: Internal Medicine

## 2022-10-05 VITALS — BP 116/78 | HR 84 | Temp 97.7°F | Ht 63.0 in | Wt 118.0 lb

## 2022-10-05 DIAGNOSIS — R739 Hyperglycemia, unspecified: Secondary | ICD-10-CM

## 2022-10-05 DIAGNOSIS — I10 Essential (primary) hypertension: Secondary | ICD-10-CM | POA: Diagnosis not present

## 2022-10-05 DIAGNOSIS — E7800 Pure hypercholesterolemia, unspecified: Secondary | ICD-10-CM

## 2022-10-05 LAB — CBC WITH DIFFERENTIAL/PLATELET
Basophils Absolute: 0.1 10*3/uL (ref 0.0–0.1)
Basophils Relative: 0.9 % (ref 0.0–3.0)
Eosinophils Absolute: 0.1 10*3/uL (ref 0.0–0.7)
Eosinophils Relative: 1.2 % (ref 0.0–5.0)
HCT: 43.8 % (ref 36.0–46.0)
Hemoglobin: 14.3 g/dL (ref 12.0–15.0)
Lymphocytes Relative: 30.6 % (ref 12.0–46.0)
Lymphs Abs: 1.9 10*3/uL (ref 0.7–4.0)
MCHC: 32.5 g/dL (ref 30.0–36.0)
MCV: 87.1 fl (ref 78.0–100.0)
Monocytes Absolute: 0.7 10*3/uL (ref 0.1–1.0)
Monocytes Relative: 11.2 % (ref 3.0–12.0)
Neutro Abs: 3.4 10*3/uL (ref 1.4–7.7)
Neutrophils Relative %: 56.1 % (ref 43.0–77.0)
Platelets: 214 10*3/uL (ref 150.0–400.0)
RBC: 5.03 Mil/uL (ref 3.87–5.11)
RDW: 14.2 % (ref 11.5–15.5)
WBC: 6.1 10*3/uL (ref 4.0–10.5)

## 2022-10-05 LAB — COMPREHENSIVE METABOLIC PANEL
ALT: 21 U/L (ref 0–35)
AST: 28 U/L (ref 0–37)
Albumin: 4.1 g/dL (ref 3.5–5.2)
Alkaline Phosphatase: 70 U/L (ref 39–117)
BUN: 18 mg/dL (ref 6–23)
CO2: 29 mEq/L (ref 19–32)
Calcium: 9.6 mg/dL (ref 8.4–10.5)
Chloride: 102 mEq/L (ref 96–112)
Creatinine, Ser: 0.72 mg/dL (ref 0.40–1.20)
GFR: 71.74 mL/min (ref >60.00)
Glucose, Bld: 98 mg/dL (ref 70–99)
Potassium: 4.7 mEq/L (ref 3.5–5.1)
Sodium: 138 mEq/L (ref 135–145)
Total Bilirubin: 1.2 mg/dL (ref 0.2–1.2)
Total Protein: 6.9 g/dL (ref 6.0–8.3)

## 2022-10-05 LAB — HEMOGLOBIN A1C: Hgb A1c MFr Bld: 5.8 % (ref 4.6–6.5)

## 2022-10-05 NOTE — Patient Instructions (Addendum)
Vaccines I recommend: Covid booster RSV vaccine Flu shot this fall  Please bring or send [place] a copy of your Healthcare Power of Attorney for your chart.   Proceed with blood work  Come back by January 2025 for your physical exam

## 2022-10-05 NOTE — Assessment & Plan Note (Signed)
HTN: BP is very good, continue losartan, metoprolol.  Check CMP and CBC Hyperglycemia: Mild, check A1c. Permanent A-fib, pacemaker: LOV cardiology 09/14/2022, no changes made.  Next visit 1 year Social: Still drives, independent, no recent falls, encouraged to continue using a cane. Skin cancer-h/o, stasis dermatitis: Recommend appropriate skin care, sees dermatology regularly. RTC 6 months

## 2022-10-05 NOTE — Progress Notes (Signed)
Subjective:    Patient ID: Peggy Mendez, female    DOB: 1926-07-20, 89 y.o.   MRN: [id number]  DOS:  10/05/2022 Type of visit - description: Follow-up  Routine follow-up.  Chronic medical problems addressed. Saw cardiology, note reviewed.  Denies chest pain or difficulty breathing. No edema  Review of Systems See above   Past Medical History:  Diagnosis Date   Cataract    REMOVED   History of colonic polyps    Hyperlipidemia    Hypertension    Osteopenia    Paroxysmal atrial fibrillation (HCC)    Sick sinus syndrome (HCC)    Skin cancer 2010   Left leg, SCC, sees derm   Syncope 11/2005    Past Surgical History:  Procedure Laterality Date   APPENDECTOMY     BREAST BIOPSY Bilateral    several, 11/2014 showed fibrocystic changes, no evidence of malignancy   BREAST BIOPSY Right 11/2015   cataracts Bilateral    COLONOSCOPY W/ BIOPSIES     EP IMPLANTABLE DEVICE N/A 11/27/2015   MDT Adapta L gen change by Dr Johney Frame   HEMORRHOID SURGERY     PPM  2008   MDT dual chamber PPM implanted by Dr Reyes Ivan for sick sinus and intermittent CHB   SKIN CANCER EXCISION Right 03/2022   R face   TOTAL ABDOMINAL HYSTERECTOMY     oophorectomy   US ECHOCARDIOGRAPHY  12/08/2005   EF 55-60%    Current Outpatient Medications  Medication Instructions   Calcium Carbonate-Vitamin D (CALCIUM 600/VITAMIN D PO) 1 capsule, Oral, Daily with lunch   cholecalciferol (VITAMIN D) 2,000 Units, Oral, Daily   ELIQUIS 2.5 MG TABS tablet TAKE 1 TABLET BY MOUTH TWICE  DAILY   ezetimibe (ZETIA) 10 MG tablet TAKE 1 TABLET BY MOUTH DAILY   Fish Oil 1,200 mg, Oral, Daily with lunch   losartan (COZAAR) 50 MG tablet TAKE 1 TABLET BY MOUTH DAILY   metoprolol tartrate (LOPRESSOR) 12.5 mg, Oral, 2 times daily   Misc Natural Products (TART CHERRY ADVANCED) CAPS 1 capsule, Oral, Daily at bedtime   multivitamin (THERAGRAN) per tablet 1 tablet, Oral, Daily,         Objective:   Physical Exam BP 116/78 (BP  Location: Left Arm, Patient Position: Sitting, Cuff Size: Normal)   Pulse 84   Temp 97.7 F (36.5 C) (Oral)   Ht 5\' 3"  (1.6 m)   Wt 118 lb (53.5 kg)   SpO2 96%   BMI 20.90 kg/m  General:   Well developed, NAD, BMI noted. HEENT:  Normocephalic . Face symmetric, atraumatic Lungs:  CTA B Normal respiratory effort, no intercostal retractions, no accessory muscle use. Heart: RRR,  no murmur.  Lower extremities:   thin and hyperpigmented to skin at the pretibial areas Neurologic:  alert & oriented X3.  Speech normal, gait appropriate for age and assisted by cane. Psych--  Cognition and judgment appear intact.  Cooperative with normal attention span and concentration.  Behavior appropriate. No anxious or depressed appearing.      Assessment     Assessment  HTN Hyperlipidemia.  Lipitor DC'd by cardiology due to aches and pains CV: -- Permanent A. Fib >> ELIQUIS --Pacemaker for Mobitz 2 --Syncope 2007, ECHO aortic sclerosis, saw cardiology Osteopenia DEA 08-2006 and 10-2008 -----T score at the spine normal. DEXA 02-2011 T score of the hips was -2.0, -2.1, mild osteopenia. Declined rx as of 02/2018  DJD  DERM:  ---Skin cancer, SCC, left leg ---Stasis dermatitis  FH breast ca -sister, brother CHF  PLAN HTN: BP is very good, continue losartan, metoprolol.  Check CMP and CBC Hyperglycemia: Mild, check A1c. Permanent A-fib, pacemaker: LOV cardiology 09/14/2022, no changes made.  Next visit 1 year Social: Still drives, independent, no recent falls, encouraged to continue using a cane. Skin cancer-h/o, stasis dermatitis: Recommend appropriate skin care, sees dermatology regularly. RTC 6 months

## 2022-11-08 ENCOUNTER — Other Ambulatory Visit: Payer: Self-pay | Admitting: Cardiology

## 2022-11-08 DIAGNOSIS — I480 Paroxysmal atrial fibrillation: Secondary | ICD-10-CM

## 2022-11-08 NOTE — Telephone Encounter (Signed)
Prescription refill request for Eliquis received. Indication:afib Last office visit:6/24 Scr:0.72  7/24 Age: 89 Weight:53.5  kg  Prescription refilled

## 2022-11-16 ENCOUNTER — Ambulatory Visit (INDEPENDENT_AMBULATORY_CARE_PROVIDER_SITE_OTHER): Payer: Medicare Other

## 2022-11-16 DIAGNOSIS — I442 Atrioventricular block, complete: Secondary | ICD-10-CM

## 2022-11-16 LAB — CUP PACEART REMOTE DEVICE CHECK
Battery Impedance: 477 Ohm
Battery Remaining Longevity: 97 mo
Battery Voltage: 2.78 V
Brady Statistic RV Percent Paced: 33 %
Date Time Interrogation Session: 20240814094700
Implantable Lead Connection Status: 753985
Implantable Lead Connection Status: 753985
Implantable Lead Implant Date: 20080318
Implantable Lead Implant Date: 20080318
Implantable Lead Location: 753859
Implantable Lead Location: 753860
Implantable Lead Model: 5076
Implantable Lead Model: 5076
Implantable Pulse Generator Implant Date: 20170825
Lead Channel Impedance Value: 502 Ohm
Lead Channel Impedance Value: 67 Ohm
Lead Channel Pacing Threshold Amplitude: 0.75 V
Lead Channel Pacing Threshold Pulse Width: 0.4 ms
Lead Channel Setting Pacing Amplitude: 2.5 V
Lead Channel Setting Pacing Pulse Width: 0.64 ms
Lead Channel Setting Sensing Sensitivity: 5.6 mV
Zone Setting Status: 755011
Zone Setting Status: 755011

## 2022-11-30 NOTE — Progress Notes (Signed)
Remote pacemaker transmission.   

## 2022-12-16 ENCOUNTER — Ambulatory Visit: Payer: Medicare Other | Admitting: Cardiology

## 2023-01-18 ENCOUNTER — Encounter: Payer: Medicare Other | Admitting: Cardiology

## 2023-02-15 ENCOUNTER — Ambulatory Visit: Payer: Medicare Other

## 2023-02-15 DIAGNOSIS — I442 Atrioventricular block, complete: Secondary | ICD-10-CM

## 2023-02-16 LAB — CUP PACEART REMOTE DEVICE CHECK
Battery Impedance: 527 Ohm
Battery Remaining Longevity: 98 mo
Battery Voltage: 2.79 V
Brady Statistic RV Percent Paced: 31 %
Date Time Interrogation Session: 20241113085126
Implantable Lead Connection Status: 753985
Implantable Lead Connection Status: 753985
Implantable Lead Implant Date: 20080318
Implantable Lead Implant Date: 20080318
Implantable Lead Location: 753859
Implantable Lead Location: 753860
Implantable Lead Model: 5076
Implantable Lead Model: 5076
Implantable Pulse Generator Implant Date: 20170825
Lead Channel Impedance Value: 507 Ohm
Lead Channel Impedance Value: 67 Ohm
Lead Channel Pacing Threshold Amplitude: 0.875 V
Lead Channel Pacing Threshold Pulse Width: 0.4 ms
Lead Channel Setting Pacing Amplitude: 2.5 V
Lead Channel Setting Pacing Pulse Width: 0.4 ms
Lead Channel Setting Sensing Sensitivity: 5.6 mV
Zone Setting Status: 755011
Zone Setting Status: 755011

## 2023-03-01 NOTE — Progress Notes (Signed)
Cardiology Office Note:    Date:  03/07/2023   ID:  Peggy Mendez, DOB 03-16-1927, MRN [id number]  PCP:  Wanda Plump, MD   [facility] Providers Cardiologist:  Stefanny Pieri [place], MD Electrophysiologist:  Lanier Prude, MD {  Referring MD: Wanda Plump, MD   Chief Complaint  Patient presents with   Atrial Fibrillation     History of Present Illness:    Peggy Mendez is a 89 y.o. female with a hx of HTN, HLD, history of syncope, PAF on Eliquis, sick sinus syndrome s/p PPM.  She initially had a Medtronic dual-chamber pacemaker placed in 2008 for complete heart block.  She underwent generator change out in August 2017.  She was noted to be in atrial fibrillation on previous pacemaker interrogation and was placed on Eliquis.  She has a history of statin intolerance and has been on Zetia.  She has been staying in atrial fibrillation 100% of the time since the end of February 2022.  She is asymptomatic. Last pacer check November 13 was acceptable.   Patient presents today for follow-up.  She is doing well from a cardiac standpoint. No chest pain or dyspnea. No palpitations.  She does have some intermittent dizziness which she attributes to BPV which she has had for years. Does complain of numbness in the first 3 fingers of her right hand.   Past Medical History:  Diagnosis Date   Cataract    REMOVED   History of colonic polyps    Hyperlipidemia    Hypertension    Osteopenia    Paroxysmal atrial fibrillation [facility])    Sick sinus syndrome (HCC)    Skin cancer 2010   Left leg, SCC, sees derm   Syncope 11/2005    Past Surgical History:  Procedure Laterality Date   APPENDECTOMY     BREAST BIOPSY Bilateral    several, 11/2014 showed fibrocystic changes, no evidence of malignancy   BREAST BIOPSY Right 11/2015   cataracts Bilateral    COLONOSCOPY W/ BIOPSIES     EP IMPLANTABLE DEVICE N/A 11/27/2015   MDT Adapta L gen change by Dr Johney Frame   HEMORRHOID SURGERY     PPM  2008    MDT dual chamber PPM implanted by Dr Reyes Ivan for sick sinus and intermittent CHB   SKIN CANCER EXCISION Right 03/2022   R face   TOTAL ABDOMINAL HYSTERECTOMY     oophorectomy   US ECHOCARDIOGRAPHY  12/08/2005   EF 55-60%    Current Medications: Current Meds  Medication Sig   Calcium Carbonate-Vitamin D (CALCIUM 600/VITAMIN D PO) Take 1 capsule by mouth daily with lunch.   cholecalciferol (VITAMIN D) 1000 units tablet Take 1,000 Units by mouth daily.   ELIQUIS 2.5 MG TABS tablet TAKE 1 TABLET BY MOUTH TWICE  DAILY   ezetimibe (ZETIA) 10 MG tablet TAKE 1 TABLET BY MOUTH DAILY   losartan (COZAAR) 50 MG tablet TAKE 1 TABLET BY MOUTH DAILY   metoprolol tartrate (LOPRESSOR) 25 MG tablet TAKE ONE-HALF TABLET BY MOUTH  TWICE DAILY   Misc Natural Products (TART CHERRY ADVANCED) CAPS Take 1 capsule by mouth at bedtime.   multivitamin (THERAGRAN) per tablet Take 1 tablet by mouth daily.   Omega-3 Fatty Acids (FISH OIL) 1200 MG CAPS Take 1,200 mg by mouth daily as needed.     Allergies:   Anesthetics, ester; Lisinopril; and Other   Social History   Socioeconomic History   Marital status: Single    Spouse  name: Not on file   Number of children: 0   Years of education: Not on file   Highest education level: Not on file  Occupational History   Occupation: retired     Associate Professor: RETIRED  Tobacco Use   Smoking status: Former    Current packs/day: 0.00    Types: Cigarettes    Quit date: 08/23/1988    Years since quitting: 34.5   Smokeless tobacco: Never   Tobacco comments:    >20 yrs ago, used to smoke ~ 1 ppd  Vaping Use   Vaping status: Never Used  Substance and Sexual Activity   Alcohol use: Yes    Comment: socially   Drug use: Yes    Types: IV   Sexual activity: Not on file  Other Topics Concern   Not on file  Social History Narrative   Lives by herself, in a condominium,  still drives.   Lost her  twin sister 12/2019   Has  extended family members checking on her    Brother in law passed 11-2020       Social Determinants of Health   Financial Resource Strain: Not on file  Food Insecurity: Not on file  Transportation Needs: Not on file  Physical Activity: Not on file  Stress: Not on file  Social Connections: Not on file     Family History: The patient's family history includes Breast cancer (age of onset: 84) in her sister; Coronary artery disease in an other family member; Heart disease in her father; Heart failure in her mother; Hypertension in her sister; Prostate cancer in her brother. There is no history of Colon cancer or Diabetes.  ROS:   Please see the history of present illness.     All other systems reviewed and are negative.  EKGs/Labs/Other Studies Reviewed:    The following studies were reviewed today:  N/A  EKG Interpretation Date/Time:  Tuesday March 07 2023 15:52:03 EST Ventricular Rate:  88 PR Interval:    QRS Duration:  80 QT Interval:  346 QTC Calculation: 418 R Axis:   92  Text Interpretation: Atrial fibrillation with  ventricular-paced complexes Rightward axis ST & T wave abnormality, consider inferior ischemia When compared with ECG of September 09, 2021  No significant change was found  Confirmed by [place], Shiana Rappleye [phone]) on 03/07/2023 4:00:13 PM   Recent Labs: 10/05/2022: ALT 21; BUN 18; Creatinine, Ser 0.72; Hemoglobin 14.3; Platelets 214.0; Potassium 4.7; Sodium 138  Recent Lipid Panel    Component Value Date/Time   CHOL 197 12/07/2021 1117   CHOL 220 (H) 12/01/2017 1100   TRIG 64.0 12/07/2021 1117   TRIG 62 06/17/2009 0000   HDL 84.40 12/07/2021 1117   HDL 108 12/01/2017 1100   CHOLHDL 2 12/07/2021 1117   VLDL 12.8 12/07/2021 1117   LDLCALC 100 (H) 12/07/2021 1117   LDLCALC 101 (H) 12/01/2017 1100   LDLDIRECT 105.8 09/07/2012 0843     Risk Assessment/Calculations:       Physical Exam:    VS:  BP 120/68   Pulse 88   Ht 5\' 3"  (1.6 m)   Wt 118 lb (53.5 kg)   SpO2 97%   BMI 20.90 kg/m      Wt Readings from Last 3 Encounters:  03/07/23 118 lb (53.5 kg)  10/05/22 118 lb (53.5 kg)  09/14/22 120 lb 9.6 oz (54.7 kg)     GEN:  Well nourished, thin in no acute distress HEENT: Normal NECK: No JVD; No carotid  bruits LYMPHATICS: No lymphadenopathy CARDIAC: IRRR, no murmurs, rubs, gallops RESPIRATORY:  Clear to auscultation without rales, wheezing or rhonchi  ABDOMEN: Soft, non-tender, non-distended MUSCULOSKELETAL:  No edema; No deformity  SKIN: Warm and dry NEUROLOGIC:  Alert and oriented x 3 PSYCHIATRIC:  Normal affect   ASSESSMENT:    1. Permanent atrial fibrillation (HCC)   2. Essential hypertension   3. Heart block AV complete (HCC)         PLAN:    In order of problems listed above:  Persistent atrial fibrillation: Has been in 100% atrial fibrillation since Feb 2022.  She has no cardiac awareness of atrial fibrillation, heart rate is well controlled.   We will continue with rate control strategy an anticoagulation  Hypertension: Blood pressure is well controlled.   Hyperlipidemia: Continue Zetia and fish oil  History of pacemaker: Followed by EP service.- Now in VVI mode. Recent pacer check normal.  Follow  up in 6 months  Medication Adjustments/Labs and Tests Ordered: Current medicines are reviewed at length with the patient today.  Concerns regarding medicines are outlined above.  Orders Placed This Encounter  Procedures   EKG 12-Lead    No orders of the defined types were placed in this encounter.    There are no Patient Instructions on file for this visit.   Signed, Zadyn Yardley [place], MD  03/07/2023 4:16 PM    [facility]

## 2023-03-07 ENCOUNTER — Encounter: Payer: Self-pay | Admitting: Cardiology

## 2023-03-07 ENCOUNTER — Ambulatory Visit: Payer: Medicare Other | Attending: Cardiology | Admitting: Cardiology

## 2023-03-07 VITALS — BP 120/68 | HR 88 | Ht 63.0 in | Wt 118.0 lb

## 2023-03-07 DIAGNOSIS — I442 Atrioventricular block, complete: Secondary | ICD-10-CM

## 2023-03-07 DIAGNOSIS — I4821 Permanent atrial fibrillation: Secondary | ICD-10-CM | POA: Diagnosis not present

## 2023-03-07 DIAGNOSIS — I10 Essential (primary) hypertension: Secondary | ICD-10-CM | POA: Diagnosis not present

## 2023-03-07 NOTE — Patient Instructions (Signed)
Medication Instructions:  Continue same medications *If you need a refill on your cardiac medications before your next appointment, please call your pharmacy*   Lab Work: None ordered   Testing/Procedures: None ordered   Follow-Up: At [facility], you and your health needs are our priority.  As part of our continuing mission to provide you with exceptional heart care, we have created designated Provider Care Teams.  These Care Teams include your primary Cardiologist (physician) and Advanced Practice Providers (APPs -  Physician Assistants and Nurse Practitioners) who all work together to provide you with the care you need, when you need it.  We recommend signing up for the patient portal called "MyChart".  Sign up information is provided on this After Visit Summary.  MyChart is used to connect with patients for Virtual Visits (Telemedicine).  Patients are able to view lab/test results, encounter notes, upcoming appointments, etc.  Non-urgent messages can be sent to your provider as well.   To learn more about what you can do with MyChart, go to [email or web address].    Your next appointment:  6 months    Call in March to schedule June appointment     Provider:  Dr.Jordan

## 2023-03-09 NOTE — Progress Notes (Signed)
Remote pacemaker transmission.   

## 2023-04-07 ENCOUNTER — Ambulatory Visit (INDEPENDENT_AMBULATORY_CARE_PROVIDER_SITE_OTHER): Payer: Medicare Other | Admitting: Internal Medicine

## 2023-04-07 ENCOUNTER — Encounter: Payer: Self-pay | Admitting: Internal Medicine

## 2023-04-07 ENCOUNTER — Other Ambulatory Visit: Payer: Self-pay | Admitting: Cardiology

## 2023-04-07 VITALS — BP 126/80 | HR 82 | Temp 98.0°F | Resp 16 | Ht 63.0 in | Wt 115.2 lb

## 2023-04-07 DIAGNOSIS — Z0001 Encounter for general adult medical examination with abnormal findings: Secondary | ICD-10-CM

## 2023-04-07 DIAGNOSIS — I10 Essential (primary) hypertension: Secondary | ICD-10-CM | POA: Diagnosis not present

## 2023-04-07 DIAGNOSIS — E7800 Pure hypercholesterolemia, unspecified: Secondary | ICD-10-CM | POA: Diagnosis not present

## 2023-04-07 DIAGNOSIS — R739 Hyperglycemia, unspecified: Secondary | ICD-10-CM | POA: Diagnosis not present

## 2023-04-07 DIAGNOSIS — Z0000 Encounter for general adult medical examination without abnormal findings: Secondary | ICD-10-CM | POA: Diagnosis not present

## 2023-04-07 LAB — BASIC METABOLIC PANEL
BUN: 19 mg/dL (ref 6–23)
CO2: 28 meq/L (ref 19–32)
Calcium: 9.4 mg/dL (ref 8.4–10.5)
Chloride: 102 meq/L (ref 96–112)
Creatinine, Ser: 0.71 mg/dL (ref 0.40–1.20)
GFR: 72.7 mL/min (ref >60.00)
Glucose, Bld: 92 mg/dL (ref 70–99)
Potassium: 4.4 meq/L (ref 3.5–5.1)
Sodium: 139 meq/L (ref 135–145)

## 2023-04-07 LAB — CBC WITH DIFFERENTIAL/PLATELET
Basophils Absolute: 0 10*3/uL (ref 0.0–0.1)
Basophils Relative: 0.5 % (ref 0.0–3.0)
Eosinophils Absolute: 0.1 10*3/uL (ref 0.0–0.7)
Eosinophils Relative: 1 % (ref 0.0–5.0)
HCT: 43.4 % (ref 36.0–46.0)
Hemoglobin: 14.4 g/dL (ref 12.0–15.0)
Lymphocytes Relative: 21.1 % (ref 12.0–46.0)
Lymphs Abs: 1.6 10*3/uL (ref 0.7–4.0)
MCHC: 33.1 g/dL (ref 30.0–36.0)
MCV: 86.5 fL (ref 78.0–100.0)
Monocytes Absolute: 1 10*3/uL (ref 0.1–1.0)
Monocytes Relative: 12.8 % — ABNORMAL HIGH (ref 3.0–12.0)
Neutro Abs: 5 10*3/uL (ref 1.4–7.7)
Neutrophils Relative %: 64.6 % (ref 43.0–77.0)
Platelets: 212 10*3/uL (ref 150.0–400.0)
RBC: 5.02 Mil/uL (ref 3.87–5.11)
RDW: 14.7 % (ref 11.5–15.5)
WBC: 7.8 10*3/uL (ref 4.0–10.5)

## 2023-04-07 LAB — HEMOGLOBIN A1C: Hgb A1c MFr Bld: 6 % (ref 4.6–6.5)

## 2023-04-07 LAB — LIPID PANEL
Cholesterol: 198 mg/dL (ref 0–200)
HDL: 83.7 mg/dL (ref >39.00)
LDL Cholesterol: 99 mg/dL (ref 0–99)
NonHDL: 114.57
Total CHOL/HDL Ratio: 2
Triglycerides: 80 mg/dL (ref 0.0–149.0)
VLDL: 16 mg/dL (ref 0.0–40.0)

## 2023-04-07 NOTE — Assessment & Plan Note (Signed)
 Here for CPX -Td  2016 -PNM 23: 2005, 02-2017; PNM 13: 2015; PNM 20: 2024 - s/p shingles- Shingrix  - s/p RSV per pt 02/13/23 - had a flu and COVID vax  -no further screening for cervical ca -h/o abnormal MMG and breast Bx; breast bx neg 11-2014;  MMG 07-2022, pt plans to cont MMGs -CCS: Cscope 8-09, 2 polyps;  Cscope 8-14, + polyps, no further scopes  -Abd u/s 2013 (-) for AAA  -Labs: BMP FLP CBC A1c -Advance directives: Has all the documents at home, states her family know where they are, she is not inclined to send them to be a scanned -Fall prevention discussed, she does very well, uses a cane  appropriately, uses a walker as needed as well.  Still drives but thinks this will be her last year. - Social: She communicates virtually-frequently with her extended family.

## 2023-04-07 NOTE — Assessment & Plan Note (Signed)
 Here for CPX HTN: Recommend to check at home, BP today is very good, Continue losartan , metoprolol , check BMP. High cholesterol: On Zetia , check FLP. Atrial fibrillation: Anticoagulated without apparent problems, check a CBC. Hyperglycemia: Mild, per chart review, check A1c RTC 7 months

## 2023-04-07 NOTE — Patient Instructions (Addendum)
  Check the  blood pressure regularly Blood pressure goal:  between 110/65 and  135/85. If it is consistently higher or lower, let me know     GO TO THE LAB : Get the blood work     Next visit with me in 7 months for a routine checkup Please schedule it at the front desk

## 2023-04-07 NOTE — Progress Notes (Signed)
 Subjective:    Patient ID: Peggy Mendez, female    DOB: [birth year], 89 y.o.   MRN: [id number]  DOS:  04/07/2023 Type of visit - description: CPX  Here for CPX. Has no major concerns, no new symptoms. Balance is decreased but she manages very well.  No falls. Chronic cough for years, unchanged.   Review of Systems  Other than above, a 14 point review of systems is negative     Past Medical History:  Diagnosis Date   Cataract    REMOVED   History of colonic polyps    Hyperlipidemia    Hypertension    Osteopenia    Paroxysmal atrial fibrillation (HCC)    Sick sinus syndrome (HCC)    Skin cancer 2010   Left leg, SCC, sees derm   Syncope 11/2005    Past Surgical History:  Procedure Laterality Date   APPENDECTOMY     BREAST BIOPSY Bilateral    several, 11/2014 showed fibrocystic changes, no evidence of malignancy   BREAST BIOPSY Right 11/2015   cataracts Bilateral    COLONOSCOPY W/ BIOPSIES     EP IMPLANTABLE DEVICE N/A 11/27/2015   MDT Adapta L gen change by Dr Kelsie   HEMORRHOID SURGERY     PPM  2008   MDT dual chamber PPM implanted by Dr Ellin for sick sinus and intermittent CHB   SKIN CANCER EXCISION Right 03/2022   R face   TOTAL ABDOMINAL HYSTERECTOMY     oophorectomy   US  ECHOCARDIOGRAPHY  12/08/2005   EF 55-60%   Social History   Socioeconomic History   Marital status: Single    Spouse name: Not on file   Number of children: 0   Years of education: Not on file   Highest education level: Not on file  Occupational History   Occupation: retired     Associate Professor: RETIRED  Tobacco Use   Smoking status: Former    Current packs/day: 0.00    Types: Cigarettes    Quit date: 08/23/1988    Years since quitting: 34.6   Smokeless tobacco: Never   Tobacco comments:    >20 yrs ago, used to smoke ~ 1 ppd  Vaping Use   Vaping status: Never Used  Substance and Sexual Activity   Alcohol use: Yes    Comment: socially   Drug use: Yes    Types: IV    Sexual activity: Not on file  Other Topics Concern   Not on file  Social History Narrative   Lives by herself, in a condominium   Still drives.   Lost her  twin sister 12/2019   Has one great niece in town, communicate w/ extended family members     Brother in social worker passed 11-2020       Social Drivers of Health   Financial Resource Strain: Not on file  Food Insecurity: Not on file  Transportation Needs: Not on file  Physical Activity: Not on file  Stress: Not on file  Social Connections: Not on file  Intimate Partner Violence: Not on file    Current Outpatient Medications  Medication Instructions   Calcium  Carbonate-Vitamin D  (CALCIUM  600/VITAMIN D  PO) 1 capsule, Daily with lunch   cholecalciferol  (VITAMIN D ) 1,000 Units, Daily   ELIQUIS  2.5 MG TABS tablet TAKE 1 TABLET BY MOUTH TWICE  DAILY   ezetimibe  (ZETIA ) 10 mg, Oral, Daily   Fish Oil 1,200 mg, Daily PRN   losartan  (COZAAR ) 50 mg, Oral, Daily   metoprolol   tartrate (LOPRESSOR ) 12.5 mg, Oral, 2 times daily   Misc Natural Products (TART CHERRY ADVANCED) CAPS 1 capsule, Daily at bedtime   multivitamin (THERAGRAN) per tablet 1 tablet, Daily       Objective:   Physical Exam BP 126/80   Pulse 82   Temp 98 F (36.7 C) (Oral)   Resp 16   Ht 5' 3 (1.6 m)   Wt 115 lb 4 oz (52.3 kg)   SpO2 95%   BMI 20.42 kg/m  General: Well developed, NAD, BMI noted Neck: No  thyromegaly  HEENT:  Normocephalic . Face symmetric, atraumatic Lungs:  CTA B Normal respiratory effort, no intercostal retractions, no accessory muscle use. Heart: Irregularly irregular, no murmur Lower extremities: no pretibial edema bilaterally  Skin: Exposed areas without rash. Not pale. Not jaundice Neurologic:  alert & oriented X3.  Speech normal, gait appropriate for age, assisted by cane Strength symmetric and appropriate for age.  Psych: Cognition and judgment appear intact.  Cooperative with normal attention span and concentration.  Behavior  appropriate. No anxious or depressed appearing.     Assessment     Assessment  HTN Hyperlipidemia.  Lipitor DC'd by cardiology due to aches and pains CV: -- Permanent A. Fib >> ELIQUIS  --Pacemaker for Mobitz 2 --Syncope 2007, ECHO aortic sclerosis, saw cardiology Osteopenia DEA 08-2006 and 10-2008 -----T score at the spine normal. DEXA 02-2011 T score of the hips was -2.0, -2.1, mild osteopenia. Declined rx >>  02/2018,2022  DJD  DERM:  ---Skin cancer, SCC, left leg ---Stasis dermatitis  FH breast ca -sister, brother CHF  PLAN Here for CPX -Td  2016 -PNM 23: 2005, 02-2017; PNM 13: 2015; PNM 20: 2024 - s/p shingles- Shingrix  - s/p RSV per pt 02/13/23 - had a flu and COVID vax  -no further screening for cervical ca -h/o abnormal MMG and breast Bx; breast bx neg 11-2014;  MMG 07-2022, pt plans to cont MMGs -CCS: Cscope 8-09, 2 polyps;  Cscope 8-14, + polyps, no further scopes  -Abd u/s 2013 (-) for AAA  -Labs: BMP FLP CBC A1c -Advance directives: Has all the documents at home, states her family know where they are, she is not inclined to send them to be a scanned -Fall prevention discussed, she does very well, uses a cane  appropriately, uses a walker as needed as well.  Still drives but thinks this will be her last year. - Social: She communicates virtually-frequently with her extended family. HTN: Recommend to check at home, BP today is very good, Continue losartan , metoprolol , check BMP. High cholesterol: On Zetia , check FLP. Atrial fibrillation: Anticoagulated without apparent problems, check a CBC. Hyperglycemia: Mild, per chart review, check A1c RTC 7 months

## 2023-04-16 ENCOUNTER — Encounter: Payer: Self-pay | Admitting: Internal Medicine

## 2023-05-17 ENCOUNTER — Ambulatory Visit (INDEPENDENT_AMBULATORY_CARE_PROVIDER_SITE_OTHER): Payer: Medicare Other

## 2023-05-17 DIAGNOSIS — I442 Atrioventricular block, complete: Secondary | ICD-10-CM | POA: Diagnosis not present

## 2023-05-19 LAB — CUP PACEART REMOTE DEVICE CHECK
Battery Impedance: 553 Ohm
Battery Remaining Longevity: 96 mo
Battery Voltage: 2.79 V
Brady Statistic RV Percent Paced: 30 %
Date Time Interrogation Session: 20250212182750
Implantable Lead Connection Status: 753985
Implantable Lead Connection Status: 753985
Implantable Lead Implant Date: 20080318
Implantable Lead Implant Date: 20080318
Implantable Lead Location: 753859
Implantable Lead Location: 753860
Implantable Lead Model: 5076
Implantable Lead Model: 5076
Implantable Pulse Generator Implant Date: 20170825
Lead Channel Impedance Value: 508 Ohm
Lead Channel Impedance Value: 67 Ohm
Lead Channel Pacing Threshold Amplitude: 0.875 V
Lead Channel Pacing Threshold Pulse Width: 0.4 ms
Lead Channel Setting Pacing Amplitude: 2.5 V
Lead Channel Setting Pacing Pulse Width: 0.4 ms
Lead Channel Setting Sensing Sensitivity: 5.6 mV
Zone Setting Status: 755011
Zone Setting Status: 755011

## 2023-05-20 ENCOUNTER — Encounter: Payer: Self-pay | Admitting: Cardiology

## 2023-05-25 ENCOUNTER — Emergency Department (HOSPITAL_COMMUNITY): Payer: Medicare Other

## 2023-05-25 ENCOUNTER — Encounter (HOSPITAL_COMMUNITY): Payer: Self-pay | Admitting: Emergency Medicine

## 2023-05-25 ENCOUNTER — Inpatient Hospital Stay (HOSPITAL_COMMUNITY)
Admission: EM | Admit: 2023-05-25 | Discharge: 2023-05-30 | DRG: 480 | Disposition: A | Discharge status: Skilled Nursing Facility | Payer: Medicare Other | Attending: Internal Medicine | Admitting: Internal Medicine

## 2023-05-25 ENCOUNTER — Other Ambulatory Visit: Payer: Self-pay

## 2023-05-25 DIAGNOSIS — I10 Essential (primary) hypertension: Secondary | ICD-10-CM | POA: Diagnosis present

## 2023-05-25 DIAGNOSIS — Z79899 Other long term (current) drug therapy: Secondary | ICD-10-CM

## 2023-05-25 DIAGNOSIS — Z888 Allergy status to other drugs, medicaments and biological substances status: Secondary | ICD-10-CM | POA: Diagnosis not present

## 2023-05-25 DIAGNOSIS — S41111A Laceration without foreign body of right upper arm, initial encounter: Secondary | ICD-10-CM | POA: Diagnosis present

## 2023-05-25 DIAGNOSIS — W010XXA Fall on same level from slipping, tripping and stumbling without subsequent striking against object, initial encounter: Secondary | ICD-10-CM | POA: Diagnosis present

## 2023-05-25 DIAGNOSIS — S72142A Displaced intertrochanteric fracture of left femur, initial encounter for closed fracture: Secondary | ICD-10-CM | POA: Diagnosis present

## 2023-05-25 DIAGNOSIS — I459 Conduction disorder, unspecified: Secondary | ICD-10-CM | POA: Diagnosis present

## 2023-05-25 DIAGNOSIS — E785 Hyperlipidemia, unspecified: Secondary | ICD-10-CM | POA: Diagnosis present

## 2023-05-25 DIAGNOSIS — I482 Chronic atrial fibrillation, unspecified: Secondary | ICD-10-CM | POA: Diagnosis present

## 2023-05-25 DIAGNOSIS — Z7901 Long term (current) use of anticoagulants: Secondary | ICD-10-CM

## 2023-05-25 DIAGNOSIS — Z860100 Personal history of colon polyps, unspecified: Secondary | ICD-10-CM | POA: Diagnosis not present

## 2023-05-25 DIAGNOSIS — Z604 Social exclusion and rejection: Secondary | ICD-10-CM | POA: Diagnosis present

## 2023-05-25 DIAGNOSIS — Z87891 Personal history of nicotine dependence: Secondary | ICD-10-CM | POA: Diagnosis not present

## 2023-05-25 DIAGNOSIS — I495 Sick sinus syndrome: Secondary | ICD-10-CM | POA: Diagnosis present

## 2023-05-25 DIAGNOSIS — D72829 Elevated white blood cell count, unspecified: Secondary | ICD-10-CM

## 2023-05-25 DIAGNOSIS — Z950 Presence of cardiac pacemaker: Secondary | ICD-10-CM | POA: Diagnosis present

## 2023-05-25 DIAGNOSIS — G9341 Metabolic encephalopathy: Secondary | ICD-10-CM | POA: Diagnosis present

## 2023-05-25 DIAGNOSIS — Z90710 Acquired absence of both cervix and uterus: Secondary | ICD-10-CM

## 2023-05-25 DIAGNOSIS — Z8249 Family history of ischemic heart disease and other diseases of the circulatory system: Secondary | ICD-10-CM

## 2023-05-25 DIAGNOSIS — I480 Paroxysmal atrial fibrillation: Secondary | ICD-10-CM | POA: Diagnosis present

## 2023-05-25 DIAGNOSIS — S72002A Fracture of unspecified part of neck of left femur, initial encounter for closed fracture: Secondary | ICD-10-CM | POA: Diagnosis not present

## 2023-05-25 DIAGNOSIS — Z85828 Personal history of other malignant neoplasm of skin: Secondary | ICD-10-CM

## 2023-05-25 DIAGNOSIS — Z9049 Acquired absence of other specified parts of digestive tract: Secondary | ICD-10-CM | POA: Diagnosis not present

## 2023-05-25 DIAGNOSIS — R296 Repeated falls: Secondary | ICD-10-CM | POA: Diagnosis present

## 2023-05-25 DIAGNOSIS — S41119A Laceration without foreign body of unspecified upper arm, initial encounter: Secondary | ICD-10-CM

## 2023-05-25 DIAGNOSIS — S7290XA Unspecified fracture of unspecified femur, initial encounter for closed fracture: Secondary | ICD-10-CM

## 2023-05-25 DIAGNOSIS — M810 Age-related osteoporosis without current pathological fracture: Secondary | ICD-10-CM | POA: Diagnosis present

## 2023-05-25 DIAGNOSIS — S72355A Nondisplaced comminuted fracture of shaft of left femur, initial encounter for closed fracture: Secondary | ICD-10-CM

## 2023-05-25 DIAGNOSIS — E7800 Pure hypercholesterolemia, unspecified: Secondary | ICD-10-CM | POA: Diagnosis not present

## 2023-05-25 HISTORY — DX: Other specified postprocedural states: Z98.890

## 2023-05-25 HISTORY — DX: Nausea with vomiting, unspecified: R11.2

## 2023-05-25 LAB — BASIC METABOLIC PANEL
Anion gap: 13 (ref 5–15)
BUN: 19 mg/dL (ref 8–23)
CO2: 24 mmol/L (ref 22–32)
Calcium: 9.1 mg/dL (ref 8.9–10.3)
Chloride: 102 mmol/L (ref 98–111)
Creatinine, Ser: 0.77 mg/dL (ref 0.44–1.00)
GFR, Estimated: >60 mL/min (ref >60)
Glucose, Bld: 150 mg/dL — ABNORMAL HIGH (ref 70–99)
Potassium: 3.7 mmol/L (ref 3.5–5.1)
Sodium: 139 mmol/L (ref 135–145)

## 2023-05-25 LAB — CBC
HCT: 39.3 % (ref 36.0–46.0)
Hemoglobin: 12.8 g/dL (ref 12.0–15.0)
MCH: 28.6 pg (ref 26.0–34.0)
MCHC: 32.6 g/dL (ref 30.0–36.0)
MCV: 87.9 fL (ref 80.0–100.0)
Platelets: 195 10*3/uL (ref 150–400)
RBC: 4.47 MIL/uL (ref 3.87–5.11)
RDW: 14.3 % (ref 11.5–15.5)
WBC: 10.9 10*3/uL — ABNORMAL HIGH (ref 4.0–10.5)
nRBC: 0 % (ref 0.0–0.2)

## 2023-05-25 LAB — PROTIME-INR
INR: 1.1 (ref 0.8–1.2)
Prothrombin Time: 14.7 s (ref 11.4–15.2)

## 2023-05-25 MED ORDER — ACETAMINOPHEN 325 MG PO TABS
650.0000 mg | ORAL_TABLET | Freq: Four times a day (QID) | ORAL | Status: DC | PRN
  Administered 2023-05-29: 650 mg via ORAL
  Filled 2023-05-25 (×3): qty 2

## 2023-05-25 MED ORDER — SODIUM CHLORIDE 0.9% FLUSH
3.0000 mL | Freq: Two times a day (BID) | INTRAVENOUS | Status: DC
  Administered 2023-05-25 – 2023-05-27 (×4): 3 mL via INTRAVENOUS

## 2023-05-25 MED ORDER — SCOPOLAMINE 1 MG/3DAYS TD PT72
1.0000 | MEDICATED_PATCH | Freq: Once | TRANSDERMAL | Status: AC
  Administered 2023-05-25: 1.5 mg via TRANSDERMAL
  Filled 2023-05-25: qty 1

## 2023-05-25 MED ORDER — OYSTER SHELL CALCIUM/D3 500-5 MG-MCG PO TABS
1.0000 | ORAL_TABLET | Freq: Every day | ORAL | Status: DC
  Administered 2023-05-26 – 2023-05-30 (×5): 1 via ORAL
  Filled 2023-05-25 (×5): qty 1

## 2023-05-25 MED ORDER — ACETAMINOPHEN 325 MG PO TABS
650.0000 mg | ORAL_TABLET | ORAL | Status: AC
  Administered 2023-05-25 – 2023-05-28 (×13): 650 mg via ORAL
  Filled 2023-05-25 (×12): qty 2

## 2023-05-25 MED ORDER — SENNOSIDES-DOCUSATE SODIUM 8.6-50 MG PO TABS
2.0000 | ORAL_TABLET | Freq: Two times a day (BID) | ORAL | Status: DC
  Administered 2023-05-25 – 2023-05-30 (×10): 2 via ORAL
  Filled 2023-05-25 (×10): qty 2

## 2023-05-25 MED ORDER — ONDANSETRON HCL 4 MG/2ML IJ SOLN
4.0000 mg | Freq: Four times a day (QID) | INTRAMUSCULAR | Status: DC | PRN
Start: 2023-05-25 — End: 2023-05-30
  Administered 2023-05-26: 4 mg via INTRAVENOUS
  Filled 2023-05-25: qty 2

## 2023-05-25 MED ORDER — SODIUM CHLORIDE 0.9 % IV SOLN: INTRAVENOUS | Status: DC

## 2023-05-25 MED ORDER — LORAZEPAM 2 MG/ML IJ SOLN
0.5000 mg | Freq: Once | INTRAMUSCULAR | Status: AC
  Administered 2023-05-25: 0.5 mg via INTRAVENOUS
  Filled 2023-05-25: qty 1

## 2023-05-25 MED ORDER — METOPROLOL TARTRATE 12.5 MG HALF TABLET
12.5000 mg | ORAL_TABLET | Freq: Two times a day (BID) | ORAL | Status: DC
  Administered 2023-05-25 – 2023-05-30 (×10): 12.5 mg via ORAL
  Filled 2023-05-25 (×10): qty 1

## 2023-05-25 MED ORDER — ACETAMINOPHEN 650 MG RE SUPP: 650.0000 mg | Freq: Four times a day (QID) | RECTAL | Status: DC | PRN

## 2023-05-25 MED ORDER — SCOPOLAMINE 1 MG/3DAYS TD PT72: 1.0000 | MEDICATED_PATCH | TRANSDERMAL | Status: DC

## 2023-05-25 MED ORDER — HYDROMORPHONE HCL 1 MG/ML IJ SOLN: 0.5000 mg | INTRAMUSCULAR | Status: DC | PRN

## 2023-05-25 MED ORDER — CELECOXIB 100 MG PO CAPS
100.0000 mg | ORAL_CAPSULE | Freq: Two times a day (BID) | ORAL | Status: AC
  Administered 2023-05-25 – 2023-05-26 (×3): 100 mg via ORAL
  Filled 2023-05-25 (×4): qty 1

## 2023-05-25 MED ORDER — HYDROMORPHONE HCL 1 MG/ML IJ SOLN: 0.2500 mg | INTRAMUSCULAR | Status: DC | PRN

## 2023-05-25 MED ORDER — HYDROMORPHONE HCL 1 MG/ML IJ SOLN
0.5000 mg | Freq: Once | INTRAMUSCULAR | Status: AC
  Administered 2023-05-25: 0.5 mg via INTRAVENOUS
  Filled 2023-05-25: qty 1

## 2023-05-25 MED ORDER — EZETIMIBE 10 MG PO TABS
10.0000 mg | ORAL_TABLET | Freq: Every day | ORAL | Status: DC
  Administered 2023-05-25 – 2023-05-30 (×6): 10 mg via ORAL
  Filled 2023-05-25 (×6): qty 1

## 2023-05-25 MED ORDER — POLYETHYLENE GLYCOL 3350 17 G PO PACK: 17.0000 g | PACK | Freq: Every day | ORAL | Status: DC | PRN

## 2023-05-25 MED ORDER — VITAMIN D 25 MCG (1000 UNIT) PO TABS
1000.0000 [IU] | ORAL_TABLET | Freq: Every day | ORAL | Status: DC
  Administered 2023-05-26 – 2023-05-30 (×5): 1000 [IU] via ORAL
  Filled 2023-05-25 (×5): qty 1

## 2023-05-25 MED ORDER — OXYCODONE HCL 5 MG PO TABS
5.0000 mg | ORAL_TABLET | ORAL | Status: DC | PRN
  Administered 2023-05-30: 5 mg via ORAL
  Filled 2023-05-25 (×2): qty 1

## 2023-05-25 NOTE — Anesthesia Preprocedure Evaluation (Addendum)
 Anesthesia Evaluation  Patient identified by MRN, date of birth, ID band Patient awake    Reviewed: Allergy & Precautions, NPO status , Patient's Chart, lab work & pertinent test results  History of Anesthesia Complications (+) PONV and history of anesthetic complications  Airway Mallampati: II  TM Distance: >3 FB Neck ROM: Full    Dental  (+) Dental Advisory Given   Pulmonary neg shortness of breath, neg sleep apnea, neg COPD, neg recent URI, former smoker   Pulmonary exam normal breath sounds clear to auscultation       Cardiovascular hypertension, Pt. on medications and Pt. on home beta blockers (-) angina (-) Past MI, (-) Cardiac Stents and (-) CABG + dysrhythmias (SSS, 2nd degree AV block) Atrial Fibrillation + pacemaker ([employer])  Rhythm:Regular Rate:Normal  HLD   Neuro/Psych neg Seizures negative neurological ROS     GI/Hepatic negative GI ROS, Neg liver ROS,,,  Endo/Other  negative endocrine ROS    Renal/GU negative Renal ROS     Musculoskeletal  (+) Arthritis ,  Osteopenia    Abdominal   Peds  Hematology negative hematology ROS (+) Lab Results      Component                Value               Date                      WBC                      10.9 (H)            05/25/2023                HGB                      12.8                05/25/2023                HCT                      39.3                05/25/2023                MCV                      87.9                05/25/2023                PLT                      195                 05/25/2023              Anesthesia Other Findings Last Eliquis: yesterday at breakfast  Reproductive/Obstetrics                             Anesthesia Physical Anesthesia Plan  ASA: 3  Anesthesia Plan: General   Post-op Pain Management: Ofirmev IV (intra-op)*   Induction: Intravenous  PONV Risk Score and Plan: 4 or greater and  Ondansetron, Dexamethasone, Treatment may vary due to age or  medical condition, Propofol infusion and TIVA  Airway Management Planned: Oral ETT  Additional Equipment:   Intra-op Plan:   Post-operative Plan: Extubation in OR  Informed Consent: I have reviewed the patients History and Physical, chart, labs and discussed the procedure including the risks, benefits and alternatives for the proposed anesthesia with the patient or authorized representative who has indicated his/her understanding and acceptance.     Dental advisory given  Plan Discussed with: CRNA and Anesthesiologist  Anesthesia Plan Comments: (Risks of general anesthesia discussed including, but not limited to, sore throat, hoarse voice, chipped/damaged teeth, injury to vocal cords, nausea and vomiting, allergic reactions, lung infection, heart attack, stroke, and death. All questions answered. )        Anesthesia Quick Evaluation

## 2023-05-25 NOTE — ED Notes (Signed)
 ED TO INPATIENT HANDOFF REPORT  ED Nurse Name and Phone #: [phone]  S Name/Age/Gender Peggy Mendez 89 y.o. female Room/Bed: 021C/021C  Code Status   Code Status: Full Code  Home/SNF/Other Home Patient oriented to: self, place, time, and situation Is this baseline? Yes   Triage Complete: Triage complete  Chief Complaint Femoral fracture (HCC) [S72.90XA]  Triage Note Pt BIBA from home w/ a fall and + thinners. Pt was bending down to reach for a pot and lost balance and landed on R hip. L hip is externally rotated outward. EMS gave of fentanyl and 4mg  of zofran. Pt is A&O x4 and speaking full sentences. Skin tear noted on upper R bicep, no other injuries noted at this time   Allergies Allergies  Allergen Reactions   Lisinopril Cough   Anesthetics, Ester Nausea And Vomiting    All Anesthetics   Other Dermatitis and Rash    Tegaderm (clear occlusive dressing) Pt was miserable    Level of Care/Admitting Diagnosis ED Disposition     ED Disposition  Admit   Condition  --   Comment  Hospital Area: [facility] [[id number]]  Level of Care: Telemetry Medical [104]  May admit patient to [facility] or [facility] if equivalent level of care is available:: No  Covid Evaluation: Asymptomatic - no recent exposure (last 10 days) testing not required  Diagnosis: Femoral fracture [facility]) [[id number]]  Admitting Physician: Nolberto Hanlon [[id number]]  Attending Physician: Nolberto Hanlon [[id number]]  Certification:: I certify this patient will need inpatient services for at least 2 midnights  Expected Medical Readiness: 05/27/2023          B Medical/Surgery History Past Medical History:  Diagnosis Date   Cataract    REMOVED   History of colonic polyps    Hyperlipidemia    Hypertension    Osteopenia    Paroxysmal atrial fibrillation (HCC)    Sick sinus syndrome (HCC)    Skin cancer 2010   Left leg, SCC, sees derm   Syncope 11/2005   Past Surgical History:   Procedure Laterality Date   APPENDECTOMY     BREAST BIOPSY Bilateral    several, 11/2014 showed fibrocystic changes, no evidence of malignancy   BREAST BIOPSY Right 11/2015   cataracts Bilateral    COLONOSCOPY W/ BIOPSIES     EP IMPLANTABLE DEVICE N/A 11/27/2015   MDT Adapta L gen change by Dr Johney Frame   HEMORRHOID SURGERY     PPM  2008   MDT dual chamber PPM implanted by Dr Reyes Ivan for sick sinus and intermittent CHB   SKIN CANCER EXCISION Right 03/2022   R face   TOTAL ABDOMINAL HYSTERECTOMY     oophorectomy   US ECHOCARDIOGRAPHY  12/08/2005   EF 55-60%     A IV Location/Drains/Wounds Patient Lines/Drains/Airways Status     Active Line/Drains/Airways     Name Placement date Placement time Site Days   Peripheral IV 05/25/23 Anterior;Left Forearm 05/25/23  --  Forearm  less than 1            Intake/Output Last 24 hours No intake or output data in the 24 hours ending 05/25/23 2000  Labs/Imaging Results for orders placed or performed during the hospital encounter of 05/25/23 (from the past 48 hours)  CBC     Status: Abnormal   Collection Time: 05/25/23  5:15 PM  Result Value Ref Range   WBC 10.9 (H) 4.0 - 10.5 K/uL   RBC 4.47 3.87 -  5.11 MIL/uL   Hemoglobin 12.8 12.0 - 15.0 g/dL   HCT 16.1 09.6 - 04.5 %   MCV 87.9 80.0 - 100.0 fL   MCH 28.6 26.0 - 34.0 pg   MCHC 32.6 30.0 - 36.0 g/dL   RDW 40.9 81.1 - 91.4 %   Platelets 195 150 - 400 K/uL   nRBC 0.0 0.0 - 0.2 %    Comment: Performed at [facility], [address] [place], [place] [address]  Basic metabolic panel     Status: Abnormal   Collection Time: 05/25/23  5:15 PM  Result Value Ref Range   Sodium 139 135 - 145 mmol/L   Potassium 3.7 3.5 - 5.1 mmol/L   Chloride 102 98 - 111 mmol/L   CO2 24 22 - 32 mmol/L   Glucose, Bld 150 (H) 70 - 99 mg/dL    Comment: Glucose reference range applies only to samples taken after fasting for at least 8 hours.   BUN 19 8 - 23 mg/dL   Creatinine, Ser 6.21 0.44 -  1.00 mg/dL   Calcium 9.1 8.9 - 30.8 mg/dL   GFR, Estimated >65 >78 mL/min    Comment: (NOTE) Calculated using the CKD-EPI Creatinine Equation (2021)    Anion gap 13 5 - 15    Comment: Performed at [facility], [address] [place], [place] [address]  Type and screen     Status: None (Preliminary result)   Collection Time: 05/25/23  5:15 PM  Result Value Ref Range   ABO/RH(D) A POS    Antibody Screen POS    Sample Expiration      05/28/2023,2359 Performed at [facility], [address], [place], [place] [address]    Antibody Identification PENDING   Protime-INR     Status: None   Collection Time: 05/25/23  5:15 PM  Result Value Ref Range   Prothrombin Time 14.7 11.4 - 15.2 seconds   INR 1.1 0.8 - 1.2    Comment: (NOTE) INR goal varies based on device and disease states. Performed at [facility], [address] [place], [place] [address]    CT Head Wo Contrast Result Date: 05/25/2023 CLINICAL DATA:  Fall, on anticoagulation. EXAM: CT HEAD WITHOUT CONTRAST TECHNIQUE: Contiguous axial images were obtained from the base of the skull through the vertex without intravenous contrast. RADIATION DOSE REDUCTION: This exam was performed according to the departmental dose-optimization program which includes automated exposure control, adjustment of the mA and/or kV according to patient size and/or use of iterative reconstruction technique. COMPARISON:  Head CT 09/11/2021 FINDINGS: Brain: No intracranial hemorrhage, mass effect, or midline shift. Stable degree of atrophy and chronic small vessel ischemia. No hydrocephalus. The basilar cisterns are patent. No evidence of territorial infarct or acute ischemia. No extra-axial or intracranial fluid collection. Vascular: Atherosclerosis of skullbase vasculature without hyperdense vessel or abnormal calcification. Skull: No fracture or focal lesion. Sinuses/Orbits: No acute finding. Other: None. IMPRESSION: 1. No acute intracranial  abnormality. No skull fracture. 2. Stable atrophy and chronic small vessel ischemia. Electronically Signed   By: Narda Rutherford M.D.   On: 05/25/2023 17:49   DG Humerus Right Result Date: 05/25/2023 CLINICAL DATA:  Fall with abrasion over right forearm. EXAM: RIGHT HUMERUS - 2+ VIEW COMPARISON:  None Available. FINDINGS: There is no evidence of fracture or other focal bone lesions. Cortical margins of the humerus are intact. Chronic change about the elbow. Chronic change about the glenohumeral joint. Soft  tissues are unremarkable. IMPRESSION: No fracture of the humerus. Electronically Signed   By: Narda Rutherford M.D.   On: 05/25/2023 17:32   DG HIP UNILAT W OR W/O PELVIS 2-3 VIEWS LEFT Result Date: 05/25/2023 CLINICAL DATA:  Pain after fall. EXAM: DG HIP (WITH OR WITHOUT PELVIS) 2-3V LEFT; LEFT FEMUR 2 VIEWS COMPARISON:  None Available. FINDINGS: Pelvis and hip: Comminuted displaced intertrochanteric left proximal femur fracture. There is mild proximal migration of the femoral shaft. No hip dislocation. No additional fracture of the pelvis. Pubic rami are intact. Pubic symphysis and sacroiliac joints are congruent. Moderate right hip osteoarthritis with joint space narrowing and spurring. Femur: Proximal femur fracture as described above. The distal femur is intact. The bones are subjectively under mineralized. Knee osteoarthritis, no knee dislocation. Vascular calcifications are seen. IMPRESSION: Comminuted displaced intertrochanteric left proximal femur fracture. Electronically Signed   By: Narda Rutherford M.D.   On: 05/25/2023 17:31   DG Femur Min 2 Views Left Result Date: 05/25/2023 CLINICAL DATA:  Pain after fall. EXAM: DG HIP (WITH OR WITHOUT PELVIS) 2-3V LEFT; LEFT FEMUR 2 VIEWS COMPARISON:  None Available. FINDINGS: Pelvis and hip: Comminuted displaced intertrochanteric left proximal femur fracture. There is mild proximal migration of the femoral shaft. No hip dislocation. No additional  fracture of the pelvis. Pubic rami are intact. Pubic symphysis and sacroiliac joints are congruent. Moderate right hip osteoarthritis with joint space narrowing and spurring. Femur: Proximal femur fracture as described above. The distal femur is intact. The bones are subjectively under mineralized. Knee osteoarthritis, no knee dislocation. Vascular calcifications are seen. IMPRESSION: Comminuted displaced intertrochanteric left proximal femur fracture. Electronically Signed   By: Narda Rutherford M.D.   On: 05/25/2023 17:31   DG Chest 1 View Result Date: 05/25/2023 CLINICAL DATA:  Pain after fall. EXAM: CHEST  1 VIEW COMPARISON:  None Available. FINDINGS: Dual lead left-sided pacemaker in place. The cardiomediastinal contours are normal. The lungs are clear. Pulmonary vasculature is normal. No consolidation, pleural effusion, or pneumothorax. No acute osseous abnormalities are seen. IMPRESSION: No acute findings or evidence of traumatic injury. Electronically Signed   By: Narda Rutherford M.D.   On: 05/25/2023 17:29    Pending Labs Unresulted Labs (From admission, onward)     Start     Ordered   05/26/23 0500  VITAMIN D 25 Hydroxy (Vit-D Deficiency, Fractures)  Tomorrow morning,   R        05/25/23 1946   05/26/23 0500  Phosphorus  Tomorrow morning,   R        05/25/23 1946   05/26/23 0500  TSH  Tomorrow morning,   R        05/25/23 1946   05/26/23 0500  APTT  Tomorrow morning,   R        05/25/23 1950   05/26/23 0500  Protime-INR  Tomorrow morning,   R        05/25/23 1950   05/26/23 0500  Basic metabolic panel  Tomorrow morning,   R        05/25/23 1950   05/26/23 0500  CBC  Tomorrow morning,   R        05/25/23 1950            Vitals/Pain Today's Vitals   05/25/23 1830 05/25/23 1920 05/25/23 1925 05/25/23 1930  BP: (!) 144/87 135/73  123/81  Pulse: 81 81  73  Resp: 18  18 13   Temp:      TempSrc:  SpO2: 100% 100%  100%  Weight:      Height:      PainSc:         Isolation Precautions No active isolations  Medications Medications  scopolamine (TRANSDERM-SCOP) 1 MG/3DAYS 1.5 mg (has no administration in time range)  ondansetron (ZOFRAN) injection 4 mg (has no administration in time range)  oxyCODONE (Oxy IR/ROXICODONE) immediate release tablet 5 mg (has no administration in time range)  HYDROmorphone (DILAUDID) injection 0.5 mg (has no administration in time range)  HYDROmorphone (DILAUDID) injection 0.25 mg (has no administration in time range)  senna-docusate (Senokot-S) tablet 2 tablet (has no administration in time range)  celecoxib (CELEBREX) capsule 100 mg (has no administration in time range)  acetaminophen (TYLENOL) tablet 650 mg (has no administration in time range)  metoprolol tartrate (LOPRESSOR) tablet 12.5 mg (has no administration in time range)  Calcium Carb-Cholecalciferol 600-10 MG-MCG TABS (has no administration in time range)  cholecalciferol (VITAMIN D3) 25 MCG (1000 UNIT) tablet 1,000 Units (has no administration in time range)  ezetimibe (ZETIA) tablet 10 mg (has no administration in time range)  0.9 %  sodium chloride infusion (has no administration in time range)  acetaminophen (TYLENOL) tablet 650 mg (has no administration in time range)    Or  acetaminophen (TYLENOL) suppository 650 mg (has no administration in time range)  polyethylene glycol (MIRALAX / GLYCOLAX) packet 17 g (has no administration in time range)  sodium chloride flush (NS) 0.9 % injection 3 mL (has no administration in time range)  HYDROmorphone (DILAUDID) injection 0.5 mg (0.5 mg Intravenous Given 05/25/23 1924)  LORazepam (ATIVAN) injection 0.5 mg (0.5 mg Intravenous Given 05/25/23 1921)    Mobility walks with device     Focused Assessments Neuro Assessment Handoff:  Swallow screen pass? Yes  Cardiac Rhythm: Atrial fibrillation       Neuro Assessment:   Neuro Checks:      Has TPA been given? No If patient is a Neuro Trauma and patient  is going to OR before floor call report to 4N Charge nurse: [phone] or [phone]   R Recommendations: See Admitting Provider Note  Report given to:   Additional Notes: na

## 2023-05-25 NOTE — ED Notes (Signed)
 Hospitalist at the bedside

## 2023-05-25 NOTE — Assessment & Plan Note (Signed)
 Likely reactive due to inflamation of fracture and laceration.

## 2023-05-25 NOTE — Assessment & Plan Note (Signed)
 Appears to be osteoporotic.  Ortho engaged by ER provider.  See note in chart.  Anticipating cephalomedullary nailing tomorrow.  On the left side.  Ortho is already aware of Eliquis, defer to them.  I will not start DVT prophylaxis at this time pharmacologically.  Currently consider in a.m. after OR.  With regards to pain, patient received half a milligram of Dilaudid in the ER, seems to have had decent response.  I will order standing acetaminophen and Celebrex 5 mg of oxycodone every 4 hours as needed for moderate pain and half a milligram of Dilaudid every 3 hours as needed for severe pain.  Patient will further have 1/4 mg of Dilaudid available for breakthrough pain.  Bowel regimen ordered.

## 2023-05-25 NOTE — Assessment & Plan Note (Signed)
 Hold losartan given acute fracture.

## 2023-05-25 NOTE — Assessment & Plan Note (Addendum)
 Suspected diagnosis based on events of today.  Continue with calcium carbonate cholecalciferol supplementation. Will check vitamin D, phos and tsh in AM

## 2023-05-25 NOTE — Consult Note (Signed)
 Brief Consult note-full consult note to follow  Orthopaedics consulted for left intertrochanteric hip fracture 89 year old female with PMH HLD, HTN, A-fib on eliquis, AV block with pacemaker who had mechanical fall. Imaging showed a left intertrochanteric hip fracture  Plan for left hip cephalomedullary nail pending medical clearance and optimization. Plan for tomorrow Admission to medical service NPO at midnight Hold Eliquis Pain control

## 2023-05-25 NOTE — Assessment & Plan Note (Signed)
 Patient has chronic A-fib, with pacemaker in situ due to query reported heart block by niece at the bedside.  At this time continue with metoprolol.

## 2023-05-25 NOTE — ED Notes (Signed)
 HUG at 2031 to Unit Secretary on 5N

## 2023-05-25 NOTE — ED Triage Notes (Signed)
 Pt BIBA from home w/ a fall and + thinners. Pt was bending down to reach for a pot and lost balance and landed on R hip. L hip is externally rotated outward. EMS gave of fentanyl and 4mg  of zofran. Pt is A&O x4 and speaking full sentences. Skin tear noted on upper R bicep, no other injuries noted at this time

## 2023-05-25 NOTE — ED Provider Notes (Signed)
 [place] EMERGENCY DEPARTMENT AT [facility] Provider Note  Arrival date/time:05/25/2023 6:12 PM  HPI/ROS   Peggy Mendez is a 89 y.o. female with PMH significant for HLD, HTN, A-fib on Eliquis, AV block status post pacemaker who presents for fall. History is provided by patient.  Patient was at home bending down to grab something out of the cabinet. She has vertigo and felt it acting up, causing her to fall onto her let hip.  She denies any head trauma or passing out. She does take Eliquis for her A-fib. She endorses that she was very close to the ground and so trauma was not significant. However she is having left hip pain. Prior to arrival, EMS administered fentanyl and Zofran.  A complete ROS was performed with pertinent positives/negatives noted above.   ED Course and Medical Decision Making   I personally reviewed the patient's vitals.  Assessment/Plan: This is a 89 year old patient presenting for a ground-level fall.  Does take Eliquis.  Denies head trauma. AAO x 4 with no head trauma on exam  Physical exam is significant for right upper extremity skin tear that is hemostatic.  Left hip deformity.  Bilateral lower extremities are neurovascularly intact.  Will obtain x-rays of the right upper extremity, left hip and pelvis.  Will also obtain CT head given possible distracting injury. Imaging shows concern for left displaced intertrochanteric hip fracture.  Patient was given IV Dilaudid for pain control.  Due to her prolonged QTc, she was also given half a milligram of Ativan for nausea.  Orthopedic surgery was consulted and recommend admission for further managment.  Disposition:  I discussed the case with hospitalist team who graciously agreed to admit the patient to their service for continued care.   Clinical Impression:  1. Closed intertrochanteric fracture of hip, left, initial encounter [facility])     Rx / DC Orders ED Discharge Orders     None        The plan for this patient was discussed with Dr. Denton Lank, who voiced agreement and who oversaw evaluation and treatment of this patient.   Clinical Complexity A medically appropriate history, review of systems, and physical exam was performed.  Patient's presentation is most consistent with acute presentation with potential threat to life or bodily function.  Medical Decision Making Amount and/or Complexity of Data Reviewed Labs: ordered. Radiology: ordered. ECG/medicine tests: ordered.  Risk Prescription drug management. Decision regarding hospitalization.    Physical Exam and Medical History   Vitals:   05/25/23 1638  BP: (!) 154/90  Pulse: 98  Temp: 97.7 F (36.5 C)  TempSrc: Oral  SpO2: 99%  Weight: 52.2 kg  Height: 5\' 3"  (1.6 m)     Physical Exam:  General: No distress, appears well hydrated and well nourished   Head: Normocephalic, atraumatic.  No skull depressions or lacerations.  No conjunctival hemorrhage No periorbital ecchymoses, Racoon Eyes, or Battle Sign bilaterally Ears atraumatic No nasal septal deviation or hematoma  PERRL, EOMI, sclera anicteric. Mucus membranes moist.    Neck: Supple, trachea midline No TTP over midline cervical spine, no step offs or deformities.  Cervical hard collar in place   Cardiovascular: RATE: regular RHYTHM: regular 2+ radial, femoral, DP pulses bilaterally   Respiratory/Chest Wall: Respiratory: normal WOB, breath sounds CTAB Clavicles stable to compression Chest stable to AP and Lateral Compression,  Chest tender to palpation   Extremities: Warm, well perfused.  Right upper arm with skin tear, hemostatic Left hip deformed externally rotated  Gastrointestinal: Abdomen soft, non tender, non distended   Neurologic: LOC: awake/alert EOM:  intact, conjugate   Genitourinary: Normal genitalia   Skin: Normal, no rash or lesions.   Glasgow Coma Scale: Eye opening: 4  Verbal:  5  Motor:  6  GCS  Total: 15     Rectal: Deferred   Spine: No TTP along midline C/T/L spine, no step offs or deformities    Other:       Medical History: Allergies  Allergen Reactions   Lisinopril Cough   Anesthetics, Ester Nausea And Vomiting    All Anesthetics   Other Dermatitis and Rash    Tegaderm (clear occlusive dressing) Pt was miserable   Past Medical History:  Diagnosis Date   Cataract    REMOVED   History of colonic polyps    Hyperlipidemia    Hypertension    Osteopenia    Paroxysmal atrial fibrillation (HCC)    Sick sinus syndrome (HCC)    Skin cancer 2010   Left leg, SCC, sees derm   Syncope 11/2005    Past Surgical History:  Procedure Laterality Date   APPENDECTOMY     BREAST BIOPSY Bilateral    several, 11/2014 showed fibrocystic changes, no evidence of malignancy   BREAST BIOPSY Right 11/2015   cataracts Bilateral    COLONOSCOPY W/ BIOPSIES     EP IMPLANTABLE DEVICE N/A 11/27/2015   MDT Adapta L gen change by Dr Johney Frame   HEMORRHOID SURGERY     PPM  2008   MDT dual chamber PPM implanted by Dr Reyes Ivan for sick sinus and intermittent CHB   SKIN CANCER EXCISION Right 03/2022   R face   TOTAL ABDOMINAL HYSTERECTOMY     oophorectomy   US ECHOCARDIOGRAPHY  12/08/2005   EF 55-60%   Family History  Problem Relation Age of Onset   Heart failure Mother    Heart disease Father    Breast cancer Sister 49       recur @ 45   Hypertension Sister    Prostate cancer Brother    Coronary artery disease Other        F, brothers x 2other family members   Colon cancer Neg Hx    Diabetes Neg Hx     Social History   Tobacco Use   Smoking status: Former    Current packs/day: 0.00    Types: Cigarettes    Quit date: 08/23/1988    Years since quitting: 34.7   Smokeless tobacco: Never   Tobacco comments:    >20 yrs ago, used to smoke ~ 1 ppd  Vaping Use   Vaping status: Never Used  Substance Use Topics   Alcohol use: Yes    Comment: socially   Drug use: Yes    Types: IV     Procedures   If procedures were preformed on this patient, they are listed below:  Procedures   -------- HPI and MDM generated using voice dictation software and may contain dictation errors. Please contact me for any clarification or with any questions.   Cephus Slater, MD Emergency Medicine PGY-2    Caron Presume, MD 05/25/23 Margretta Ditty    Cathren Laine, MD 05/26/23 [phone]

## 2023-05-25 NOTE — Assessment & Plan Note (Signed)
 Right upper arm over biceps about 2cm X 1 cm. Distal function intact. Seems SQ deep. Will request wuond care eval.

## 2023-05-25 NOTE — H&P (Signed)
 History and Physical    Patient: Peggy Mendez ZOX:[id number] DOB: December 07, 1926 DOA: 05/25/2023 DOS: the patient was seen and examined on 05/25/2023 PCP: Wanda Plump, MD  Patient coming from: Home  Chief Complaint:  Chief Complaint  Patient presents with   Fall   HPI: Peggy Mendez is a 89 y.o. female with medical history significant of medical issues as listed below.  Patient does have history of atrial fibrillation with heart block and pacemaker placement.  Patient is on chronic anticoagulation for same.  Generally is able to ambulate, as much as she want indefinite distances without any chest pain or presyncope or shortness of breath.  Although she does walk slowly.  Patient does not endorse history of repeated falls.  Patient lives independently and reports being at home by herself bending over a cabinet to get apart from one of the lower cabinets.  Unfortunately she lost her balance and fell down and twisted her left lower extremity.  Followed by left hip pain and inability to get up.  Fortunately patient was able to summon her neighbor and is brought to [facility], ER with finding of left femoral fracture.  Patient denies any vertigo, although she does have chronic vertigo, denies any presyncope or focal weakness prior to onset of her fall.  Patient received 100 mcg of fentanyl in the ambulance.  And has received Dilaudid in the ER.  Patient is very prone to motion sickness and due to her ambulance ride, became "sick ".  Patient is s/p Ativan just at the end of this encounter as well as scopolamine patch is pending.  Patient denied to me any other site of pain or discomfort except for right upper arm area anterior laceration where the arm got squeezed in between the cabinet and the patient's body.  Ortho engaged by ER provider. Review of Systems: As mentioned in the history of present illness. All other systems reviewed and are negative. Past Medical History:  Diagnosis Date    Cataract    REMOVED   History of colonic polyps    Hyperlipidemia    Hypertension    Osteopenia    Paroxysmal atrial fibrillation [facility])    Sick sinus syndrome (HCC)    Skin cancer 2010   Left leg, SCC, sees derm   Syncope 11/2005   Past Surgical History:  Procedure Laterality Date   APPENDECTOMY     BREAST BIOPSY Bilateral    several, 11/2014 showed fibrocystic changes, no evidence of malignancy   BREAST BIOPSY Right 11/2015   cataracts Bilateral    COLONOSCOPY W/ BIOPSIES     EP IMPLANTABLE DEVICE N/A 11/27/2015   MDT Adapta L gen change by Dr Johney Frame   HEMORRHOID SURGERY     PPM  2008   MDT dual chamber PPM implanted by Dr Reyes Ivan for sick sinus and intermittent CHB   SKIN CANCER EXCISION Right 03/2022   R face   TOTAL ABDOMINAL HYSTERECTOMY     oophorectomy   US ECHOCARDIOGRAPHY  12/08/2005   EF 55-60%   Social History:  reports that she quit smoking about 34 years ago. Her smoking use included cigarettes. She has never used smokeless tobacco. She reports current alcohol use. She reports current drug use. Drug: IV.  Allergies  Allergen Reactions   Lisinopril Cough   Anesthetics, Ester Nausea And Vomiting    All Anesthetics   Other Dermatitis and Rash    Tegaderm (clear occlusive dressing) Pt was miserable    Family History  Problem Relation Age of Onset   Heart failure Mother    Heart disease Father    Breast cancer Sister 39       recur @ 44   Hypertension Sister    Prostate cancer Brother    Coronary artery disease Other        F, brothers x 2other family members   Colon cancer Neg Hx    Diabetes Neg Hx     Prior to Admission medications   Medication Sig Start Date End Date Taking? Authorizing Provider  Calcium Carbonate-Vitamin D (CALCIUM 600/VITAMIN D PO) Take 1 capsule by mouth daily with lunch.   Yes [provider]  cholecalciferol (VITAMIN D) 1000 units tablet Take 1,000 Units by mouth daily.   Yes [provider]  ELIQUIS 2.5 MG  TABS tablet TAKE 1 TABLET BY MOUTH TWICE  DAILY 11/08/22  Yes [place], Peter M, MD  ezetimibe (ZETIA) 10 MG tablet TAKE 1 TABLET BY MOUTH DAILY 04/07/23  Yes [place], Peter M, MD  losartan (COZAAR) 50 MG tablet TAKE 1 TABLET BY MOUTH DAILY 04/07/23  Yes [place], Peter M, MD  metoprolol tartrate (LOPRESSOR) 25 MG tablet TAKE ONE-HALF TABLET BY MOUTH  TWICE DAILY 04/07/23  Yes [place], Peter M, MD  Misc Natural Products (TART CHERRY ADVANCED) CAPS Take 1 capsule by mouth at bedtime.   Yes [provider]  multivitamin [facility]) per tablet Take 1 tablet by mouth daily.   Yes [provider]  Omega-3 Fatty Acids (FISH OIL) 1200 MG CAPS Take 1,200 mg by mouth daily as needed.   Yes [provider]    Physical Exam: Vitals:   05/25/23 1815 05/25/23 1830 05/25/23 1920 05/25/23 1925  BP: (!) 147/71 (!) 144/87 135/73   Pulse: 87 81 81   Resp: 20 18  18   Temp:      TempSrc:      SpO2: 99% 100% 100%   Weight:      Height:       General: Patient was alert and awake prior to getting her Ativan and fully coherent.  Saturating well over 90% on room air.  No distress.  Gave a coherent account of her symptoms.  Patient's great niece at the bedside son of Mr. Conception Chancy. Respiratory exam: Bilateral intravesicular Cardiovascular exam S1-S2 normal irregular Abdomen soft nontender Extremities warm without edema.  I did not manipulate the left hip.  Distal function is intact no clinical fracture of the left leg or foot.  Right lower extremity appears to be without any focal deficit.  No weakness of the bilateral upper extremities.  I palpated patient's entirety of spine, limited by patient being recumbent and not wanting to move.  No focal tenderness appreciated. Pelvis stable. Data Reviewed:  Labs on Admission:  Results for orders placed or performed during the hospital encounter of 05/25/23 (from the past 24 hours)  CBC     Status: Abnormal   Collection Time: 05/25/23  5:15 PM  Result Value  Ref Range   WBC 10.9 (H) 4.0 - 10.5 K/uL   RBC 4.47 3.87 - 5.11 MIL/uL   Hemoglobin 12.8 12.0 - 15.0 g/dL   HCT 16.1 09.6 - 04.5 %   MCV 87.9 80.0 - 100.0 fL   MCH 28.6 26.0 - 34.0 pg   MCHC 32.6 30.0 - 36.0 g/dL   RDW 40.9 81.1 - 91.4 %   Platelets 195 150 - 400 K/uL   nRBC 0.0 0.0 - 0.2 %  Basic metabolic  panel     Status: Abnormal   Collection Time: 05/25/23  5:15 PM  Result Value Ref Range   Sodium 139 135 - 145 mmol/L   Potassium 3.7 3.5 - 5.1 mmol/L   Chloride 102 98 - 111 mmol/L   CO2 24 22 - 32 mmol/L   Glucose, Bld 150 (H) 70 - 99 mg/dL   BUN 19 8 - 23 mg/dL   Creatinine, Ser 0.98 0.44 - 1.00 mg/dL   Calcium 9.1 8.9 - 11.9 mg/dL   GFR, Estimated >14 >78 mL/min   Anion gap 13 5 - 15  Type and screen     Status: None (Preliminary result)   Collection Time: 05/25/23  5:15 PM  Result Value Ref Range   ABO/RH(D) A POS    Antibody Screen POS    Sample Expiration      05/28/2023,2359 Performed at [facility], [address] [place], [place] [address]    Antibody Identification PENDING   Protime-INR     Status: None   Collection Time: 05/25/23  5:15 PM  Result Value Ref Range   Prothrombin Time 14.7 11.4 - 15.2 seconds   INR 1.1 0.8 - 1.2   Basic Metabolic Panel: Recent Labs  Lab 05/25/23 1715  NA 139  K 3.7  CL 102  CO2 24  GLUCOSE 150*  BUN 19  CREATININE 0.77  CALCIUM 9.1   Liver Function Tests: No results for input(s): "AST", "ALT", "ALKPHOS", "BILITOT", "PROT", "ALBUMIN" in the last 168 hours. No results for input(s): "LIPASE", "AMYLASE" in the last 168 hours. No results for input(s): "AMMONIA" in the last 168 hours. CBC: Recent Labs  Lab 05/25/23 1715  WBC 10.9*  HGB 12.8  HCT 39.3  MCV 87.9  PLT 195   Cardiac Enzymes: No results for input(s): "CKTOTAL", "CKMB", "CKMBINDEX", "TROPONINIHS" in the last 168 hours.  BNP (last 3 results) No results for input(s): "PROBNP" in the last 8760 hours. CBG: No results for input(s): "GLUCAP"  in the last 168 hours.  Radiological Exams on Admission:  CT Head Wo Contrast Result Date: 05/25/2023 CLINICAL DATA:  Fall, on anticoagulation. EXAM: CT HEAD WITHOUT CONTRAST TECHNIQUE: Contiguous axial images were obtained from the base of the skull through the vertex without intravenous contrast. RADIATION DOSE REDUCTION: This exam was performed according to the departmental dose-optimization program which includes automated exposure control, adjustment of the mA and/or kV according to patient size and/or use of iterative reconstruction technique. COMPARISON:  Head CT 09/11/2021 FINDINGS: Brain: No intracranial hemorrhage, mass effect, or midline shift. Stable degree of atrophy and chronic small vessel ischemia. No hydrocephalus. The basilar cisterns are patent. No evidence of territorial infarct or acute ischemia. No extra-axial or intracranial fluid collection. Vascular: Atherosclerosis of skullbase vasculature without hyperdense vessel or abnormal calcification. Skull: No fracture or focal lesion. Sinuses/Orbits: No acute finding. Other: None. IMPRESSION: 1. No acute intracranial abnormality. No skull fracture. 2. Stable atrophy and chronic small vessel ischemia. Electronically Signed   By: Narda Rutherford M.D.   On: 05/25/2023 17:49   DG Humerus Right Result Date: 05/25/2023 CLINICAL DATA:  Fall with abrasion over right forearm. EXAM: RIGHT HUMERUS - 2+ VIEW COMPARISON:  None Available. FINDINGS: There is no evidence of fracture or other focal bone lesions. Cortical margins of the humerus are intact. Chronic change about the elbow. Chronic change about the glenohumeral joint. Soft tissues are unremarkable. IMPRESSION: No fracture of the humerus. Electronically Signed   By: Ivette Loyal.D.  On: 05/25/2023 17:32   DG HIP UNILAT W OR W/O PELVIS 2-3 VIEWS LEFT Result Date: 05/25/2023 CLINICAL DATA:  Pain after fall. EXAM: DG HIP (WITH OR WITHOUT PELVIS) 2-3V LEFT; LEFT FEMUR 2 VIEWS COMPARISON:   None Available. FINDINGS: Pelvis and hip: Comminuted displaced intertrochanteric left proximal femur fracture. There is mild proximal migration of the femoral shaft. No hip dislocation. No additional fracture of the pelvis. Pubic rami are intact. Pubic symphysis and sacroiliac joints are congruent. Moderate right hip osteoarthritis with joint space narrowing and spurring. Femur: Proximal femur fracture as described above. The distal femur is intact. The bones are subjectively under mineralized. Knee osteoarthritis, no knee dislocation. Vascular calcifications are seen. IMPRESSION: Comminuted displaced intertrochanteric left proximal femur fracture. Electronically Signed   By: Narda Rutherford M.D.   On: 05/25/2023 17:31   DG Femur Min 2 Views Left Result Date: 05/25/2023 CLINICAL DATA:  Pain after fall. EXAM: DG HIP (WITH OR WITHOUT PELVIS) 2-3V LEFT; LEFT FEMUR 2 VIEWS COMPARISON:  None Available. FINDINGS: Pelvis and hip: Comminuted displaced intertrochanteric left proximal femur fracture. There is mild proximal migration of the femoral shaft. No hip dislocation. No additional fracture of the pelvis. Pubic rami are intact. Pubic symphysis and sacroiliac joints are congruent. Moderate right hip osteoarthritis with joint space narrowing and spurring. Femur: Proximal femur fracture as described above. The distal femur is intact. The bones are subjectively under mineralized. Knee osteoarthritis, no knee dislocation. Vascular calcifications are seen. IMPRESSION: Comminuted displaced intertrochanteric left proximal femur fracture. Electronically Signed   By: Narda Rutherford M.D.   On: 05/25/2023 17:31   DG Chest 1 View Result Date: 05/25/2023 CLINICAL DATA:  Pain after fall. EXAM: CHEST  1 VIEW COMPARISON:  None Available. FINDINGS: Dual lead left-sided pacemaker in place. The cardiomediastinal contours are normal. The lungs are clear. Pulmonary vasculature is normal. No consolidation, pleural effusion, or  pneumothorax. No acute osseous abnormalities are seen. IMPRESSION: No acute findings or evidence of traumatic injury. Electronically Signed   By: Narda Rutherford M.D.   On: 05/25/2023 17:29     EKG: Independently reviewed. Rate controled afib.  No intake/output data recorded. No intake/output data recorded.    Assessment and Plan: * Femoral fracture (HCC) Appears to be osteoporotic.  Ortho engaged by ER provider.  See note in chart.  Anticipating cephalomedullary nailing tomorrow.  On the left side.  Ortho is already aware of Eliquis, defer to them.  I will not start DVT prophylaxis at this time pharmacologically.  Currently consider in a.m. after OR.  With regards to pain, patient received half a milligram of Dilaudid in the ER, seems to have had decent response.  I will order standing acetaminophen and Celebrex 5 mg of oxycodone every 4 hours as needed for moderate pain and half a milligram of Dilaudid every 3 hours as needed for severe pain.  Patient will further have 1/4 mg of Dilaudid available for breakthrough pain.  Bowel regimen ordered.  Osteoporosis Suspected diagnosis based on events of today.  Continue with calcium carbonate cholecalciferol supplementation. Will check vitamin D, phos and tsh in AM  Leukocytosis Likely reactive due to inflamation of fracture and laceration.  Laceration of arm Right upper arm over biceps about 2cm X 1 cm. Distal function intact. Seems SQ deep. Will request wuond care eval.  PACEMAKER-[employer] Patient has chronic A-fib, with pacemaker in situ due to query reported heart block by niece at the bedside.  At this time continue with metoprolol.  Essential hypertension  Hold losartan given acute fracture.      Advance Care Planning:   Code Status: Prior I discussed with patient's son Mr. Conception Chancy as well as great niece at the bedside.  Patient is full code.  Consults: ortho as above.  Family Communication: discussed as abvoe.  Severity of  Illness: The appropriate patient status for this patient is INPATIENT. Inpatient status is judged to be reasonable and necessary in order to provide the required intensity of service to ensure the patient's safety. The patient's presenting symptoms, physical exam findings, and initial radiographic and laboratory data in the context of their chronic comorbidities is felt to place them at high risk for further clinical deterioration. Furthermore, it is not anticipated that the patient will be medically stable for discharge from the hospital within 2 midnights of admission.   * I certify that at the point of admission it is my clinical judgment that the patient will require inpatient hospital care spanning beyond 2 midnights from the point of admission due to high intensity of service, high risk for further deterioration and high frequency of surveillance required.*  Author: Nolberto Hanlon, MD 05/25/2023 7:36 PM  For on call review [email or web address].

## 2023-05-26 ENCOUNTER — Encounter (HOSPITAL_COMMUNITY): Payer: Self-pay | Admitting: Internal Medicine

## 2023-05-26 ENCOUNTER — Encounter (HOSPITAL_COMMUNITY)
Admission: EM | Disposition: A | Discharge status: Skilled Nursing Facility | Payer: Self-pay | Source: Home / Self Care | Attending: Internal Medicine

## 2023-05-26 ENCOUNTER — Inpatient Hospital Stay (HOSPITAL_COMMUNITY): Payer: Medicare Other

## 2023-05-26 ENCOUNTER — Inpatient Hospital Stay (HOSPITAL_COMMUNITY): Payer: Medicare Other | Admitting: Anesthesiology

## 2023-05-26 ENCOUNTER — Other Ambulatory Visit: Payer: Self-pay

## 2023-05-26 DIAGNOSIS — S72002A Fracture of unspecified part of neck of left femur, initial encounter for closed fracture: Secondary | ICD-10-CM

## 2023-05-26 DIAGNOSIS — Z87891 Personal history of nicotine dependence: Secondary | ICD-10-CM

## 2023-05-26 DIAGNOSIS — E7800 Pure hypercholesterolemia, unspecified: Secondary | ICD-10-CM | POA: Diagnosis not present

## 2023-05-26 DIAGNOSIS — I10 Essential (primary) hypertension: Secondary | ICD-10-CM | POA: Diagnosis not present

## 2023-05-26 DIAGNOSIS — S72142A Displaced intertrochanteric fracture of left femur, initial encounter for closed fracture: Secondary | ICD-10-CM

## 2023-05-26 HISTORY — PX: INTRAMEDULLARY (IM) NAIL INTERTROCHANTERIC: SHX5875

## 2023-05-26 LAB — URINALYSIS, ROUTINE W REFLEX MICROSCOPIC
Bilirubin Urine: NEGATIVE
Glucose, UA: NEGATIVE mg/dL
Hgb urine dipstick: NEGATIVE
Ketones, ur: 5 mg/dL — AB
Leukocytes,Ua: NEGATIVE
Nitrite: NEGATIVE
Protein, ur: 30 mg/dL — AB
Specific Gravity, Urine: 1.023 (ref 1.005–1.030)
pH: 5 (ref 5.0–8.0)

## 2023-05-26 LAB — CBC
HCT: 32.5 % — ABNORMAL LOW (ref 36.0–46.0)
Hemoglobin: 10.9 g/dL — ABNORMAL LOW (ref 12.0–15.0)
MCH: 28.5 pg (ref 26.0–34.0)
MCHC: 33.5 g/dL (ref 30.0–36.0)
MCV: 84.9 fL (ref 80.0–100.0)
Platelets: 183 10*3/uL (ref 150–400)
RBC: 3.83 MIL/uL — ABNORMAL LOW (ref 3.87–5.11)
RDW: 14.1 % (ref 11.5–15.5)
WBC: 13.8 10*3/uL — ABNORMAL HIGH (ref 4.0–10.5)
nRBC: 0 % (ref 0.0–0.2)

## 2023-05-26 LAB — BASIC METABOLIC PANEL
Anion gap: 12 (ref 5–15)
BUN: 14 mg/dL (ref 8–23)
CO2: 23 mmol/L (ref 22–32)
Calcium: 9.2 mg/dL (ref 8.9–10.3)
Chloride: 102 mmol/L (ref 98–111)
Creatinine, Ser: 0.6 mg/dL (ref 0.44–1.00)
GFR, Estimated: >60 mL/min (ref >60)
Glucose, Bld: 113 mg/dL — ABNORMAL HIGH (ref 70–99)
Potassium: 4.4 mmol/L (ref 3.5–5.1)
Sodium: 137 mmol/L (ref 135–145)

## 2023-05-26 LAB — PROTIME-INR
INR: 1.1 (ref 0.8–1.2)
Prothrombin Time: 14.7 s (ref 11.4–15.2)

## 2023-05-26 LAB — SURGICAL PCR SCREEN
MRSA, PCR: NEGATIVE
Staphylococcus aureus: NEGATIVE

## 2023-05-26 LAB — ABO/RH: ABO/RH(D): A POS

## 2023-05-26 LAB — VITAMIN D 25 HYDROXY (VIT D DEFICIENCY, FRACTURES): Vit D, 25-Hydroxy: 66.69 ng/mL (ref 30–100)

## 2023-05-26 LAB — TSH: TSH: 2.113 u[IU]/mL (ref 0.350–4.500)

## 2023-05-26 LAB — APTT: aPTT: 31 s (ref 24–36)

## 2023-05-26 LAB — PHOSPHORUS: Phosphorus: 4.3 mg/dL (ref 2.5–4.6)

## 2023-05-26 SURGERY — FIXATION, FRACTURE, INTERTROCHANTERIC, WITH INTRAMEDULLARY ROD
Anesthesia: General | Laterality: Left

## 2023-05-26 MED ORDER — ONDANSETRON HCL 4 MG/2ML IJ SOLN
INTRAMUSCULAR | Status: AC
Start: 2023-05-26 — End: ?
  Filled 2023-05-26: qty 2

## 2023-05-26 MED ORDER — FENTANYL CITRATE (PF) 100 MCG/2ML IJ SOLN
25.0000 ug | INTRAMUSCULAR | Status: DC | PRN
  Administered 2023-05-26: 25 ug via INTRAVENOUS

## 2023-05-26 MED ORDER — ROCURONIUM BROMIDE 10 MG/ML (PF) SYRINGE
PREFILLED_SYRINGE | INTRAVENOUS | Status: DC | PRN
Start: 2023-05-26 — End: 2023-05-26
  Administered 2023-05-26: 20 mg via INTRAVENOUS
  Administered 2023-05-26: 40 mg via INTRAVENOUS

## 2023-05-26 MED ORDER — OXYCODONE HCL 5 MG PO TABS: 5.0000 mg | ORAL_TABLET | Freq: Once | ORAL | Status: DC | PRN

## 2023-05-26 MED ORDER — SUGAMMADEX SODIUM 200 MG/2ML IV SOLN
INTRAVENOUS | Status: DC | PRN
  Administered 2023-05-26: 110 mg via INTRAVENOUS

## 2023-05-26 MED ORDER — LIDOCAINE 2% (20 MG/ML) 5 ML SYRINGE
INTRAMUSCULAR | Status: DC | PRN
  Administered 2023-05-26: 50 mg via INTRAVENOUS

## 2023-05-26 MED ORDER — PROPOFOL 500 MG/50ML IV EMUL
INTRAVENOUS | Status: DC | PRN
  Administered 2023-05-26: 100 ug/kg/min via INTRAVENOUS

## 2023-05-26 MED ORDER — PHENYLEPHRINE 80 MCG/ML (10ML) SYRINGE FOR IV PUSH (FOR BLOOD PRESSURE SUPPORT)
PREFILLED_SYRINGE | INTRAVENOUS | Status: DC | PRN
  Administered 2023-05-26: 80 ug via INTRAVENOUS

## 2023-05-26 MED ORDER — FENTANYL CITRATE (PF) 250 MCG/5ML IJ SOLN
INTRAMUSCULAR | Status: DC | PRN
Start: 2023-05-26 — End: 2023-05-26
  Administered 2023-05-26: 50 ug via INTRAVENOUS
  Administered 2023-05-26 (×2): 25 ug via INTRAVENOUS

## 2023-05-26 MED ORDER — ESMOLOL HCL 100 MG/10ML IV SOLN
INTRAVENOUS | Status: DC | PRN
  Administered 2023-05-26 (×2): 20 mg via INTRAVENOUS

## 2023-05-26 MED ORDER — ONDANSETRON HCL 4 MG/2ML IJ SOLN
INTRAMUSCULAR | Status: DC | PRN
  Administered 2023-05-26: 4 mg via INTRAVENOUS

## 2023-05-26 MED ORDER — CEFAZOLIN SODIUM-DEXTROSE 2-4 GM/100ML-% IV SOLN
INTRAVENOUS | Status: AC
  Filled 2023-05-26: qty 100

## 2023-05-26 MED ORDER — PROPOFOL 10 MG/ML IV BOLUS
INTRAVENOUS | Status: AC
  Filled 2023-05-26: qty 20

## 2023-05-26 MED ORDER — PHENYLEPHRINE HCL-NACL 20-0.9 MG/250ML-% IV SOLN
INTRAVENOUS | Status: DC | PRN
  Administered 2023-05-26: 25 ug/min via INTRAVENOUS

## 2023-05-26 MED ORDER — FENTANYL CITRATE (PF) 250 MCG/5ML IJ SOLN
INTRAMUSCULAR | Status: AC
Start: 2023-05-26 — End: ?
  Filled 2023-05-26: qty 5

## 2023-05-26 MED ORDER — AMISULPRIDE (ANTIEMETIC) 5 MG/2ML IV SOLN: 10.0000 mg | Freq: Once | INTRAVENOUS | Status: DC | PRN

## 2023-05-26 MED ORDER — SUCCINYLCHOLINE CHLORIDE 200 MG/10ML IV SOSY
PREFILLED_SYRINGE | INTRAVENOUS | Status: AC
  Filled 2023-05-26: qty 10

## 2023-05-26 MED ORDER — CEFAZOLIN SODIUM-DEXTROSE 2-3 GM-%(50ML) IV SOLR
INTRAVENOUS | Status: DC | PRN
  Administered 2023-05-26: 2 g via INTRAVENOUS

## 2023-05-26 MED ORDER — LACTATED RINGERS IV SOLN: INTRAVENOUS | Status: DC

## 2023-05-26 MED ORDER — FENTANYL CITRATE (PF) 250 MCG/5ML IJ SOLN
INTRAMUSCULAR | Status: AC
  Filled 2023-05-26: qty 5

## 2023-05-26 MED ORDER — PROPOFOL 10 MG/ML IV BOLUS
INTRAVENOUS | Status: DC | PRN
  Administered 2023-05-26: 70 mg via INTRAVENOUS

## 2023-05-26 MED ORDER — FENTANYL CITRATE (PF) 100 MCG/2ML IJ SOLN
INTRAMUSCULAR | Status: AC
  Filled 2023-05-26: qty 2

## 2023-05-26 MED ORDER — ORAL CARE MOUTH RINSE: 15.0000 mL | Freq: Once | OROMUCOSAL | Status: AC

## 2023-05-26 MED ORDER — TRANEXAMIC ACID-NACL 1000-0.7 MG/100ML-% IV SOLN
INTRAVENOUS | Status: AC
  Filled 2023-05-26: qty 100

## 2023-05-26 MED ORDER — OXYCODONE HCL 5 MG/5ML PO SOLN: 5.0000 mg | Freq: Once | ORAL | Status: DC | PRN

## 2023-05-26 MED ORDER — CHLORHEXIDINE GLUCONATE 0.12 % MT SOLN
15.0000 mL | Freq: Once | OROMUCOSAL | Status: AC
  Administered 2023-05-26: 15 mL via OROMUCOSAL

## 2023-05-26 MED ORDER — CHLORHEXIDINE GLUCONATE 0.12 % MT SOLN
OROMUCOSAL | Status: AC
Start: 2023-05-26 — End: 2023-05-27
  Filled 2023-05-26: qty 15

## 2023-05-26 MED ORDER — CEFAZOLIN SODIUM-DEXTROSE 1-4 GM/50ML-% IV SOLN
1.0000 g | Freq: Three times a day (TID) | INTRAVENOUS | Status: AC
  Administered 2023-05-27 (×3): 1 g via INTRAVENOUS
  Filled 2023-05-26 (×3): qty 50

## 2023-05-26 MED ORDER — 0.9 % SODIUM CHLORIDE (POUR BTL) OPTIME
TOPICAL | Status: DC | PRN
  Administered 2023-05-26: 1000 mL

## 2023-05-26 MED ORDER — AMISULPRIDE (ANTIEMETIC) 5 MG/2ML IV SOLN
INTRAVENOUS | Status: AC
  Filled 2023-05-26: qty 4

## 2023-05-26 MED ORDER — TRANEXAMIC ACID-NACL 1000-0.7 MG/100ML-% IV SOLN
INTRAVENOUS | Status: DC | PRN
  Administered 2023-05-26: 1000 mg via INTRAVENOUS

## 2023-05-26 MED ORDER — DEXAMETHASONE SODIUM PHOSPHATE 10 MG/ML IJ SOLN
INTRAMUSCULAR | Status: AC
  Filled 2023-05-26: qty 1

## 2023-05-26 MED ORDER — LIDOCAINE 2% (20 MG/ML) 5 ML SYRINGE
INTRAMUSCULAR | Status: AC
  Filled 2023-05-26: qty 5

## 2023-05-26 SURGICAL SUPPLY — 44 items
ALCOHOL 70% 16 OZ (MISCELLANEOUS) ×1 IMPLANT
BAG COUNTER SPONGE SURGICOUNT (BAG) ×1 IMPLANT
BIT DRILL INTERTAN LAG SCREW (BIT) IMPLANT
BIT DRILL LONG 4.0 (BIT) IMPLANT
BNDG COHESIVE 6X5 TAN ST LF (GAUZE/BANDAGES/DRESSINGS) ×2 IMPLANT
CANISTER SUCT 3000ML PPV (MISCELLANEOUS) ×1 IMPLANT
COVER PERINEAL POST (MISCELLANEOUS) ×1 IMPLANT
COVER SURGICAL LIGHT HANDLE (MISCELLANEOUS) ×1 IMPLANT
DRAPE C-ARM 42X72 X-RAY (DRAPES) ×1 IMPLANT
DRAPE HALF SHEET 40X57 (DRAPES) IMPLANT
DRAPE INCISE IOBAN 66X45 STRL (DRAPES) ×1 IMPLANT
DRAPE STERI IOBAN 125X83 (DRAPES) ×1 IMPLANT
DRILL BIT LONG 4.0 (BIT) ×1 IMPLANT
DRSG ADAPTIC 3X8 NADH LF (GAUZE/BANDAGES/DRESSINGS) ×1 IMPLANT
DRSG TEGADERM 4X4.75 (GAUZE/BANDAGES/DRESSINGS) IMPLANT
DURAPREP 26ML APPLICATOR (WOUND CARE) ×1 IMPLANT
ELECT CAUTERY BLADE 6.4 (BLADE) ×1 IMPLANT
ELECT REM PT RETURN 9FT ADLT (ELECTROSURGICAL) ×1 IMPLANT
ELECTRODE REM PT RTRN 9FT ADLT (ELECTROSURGICAL) ×1 IMPLANT
GAUZE SPONGE 4X4 12PLY STRL (GAUZE/BANDAGES/DRESSINGS) IMPLANT
GAUZE SPONGE 4X4 12PLY STRL LF (GAUZE/BANDAGES/DRESSINGS) ×1 IMPLANT
GAUZE XEROFORM 1X8 LF (GAUZE/BANDAGES/DRESSINGS) IMPLANT
GLOVE BIO SURGEON STRL SZ7.5 (GLOVE) ×2 IMPLANT
GLOVE BIOGEL PI IND STRL 8 (GLOVE) ×2 IMPLANT
GOWN STRL REUS W/ TWL LRG LVL3 (GOWN DISPOSABLE) ×1 IMPLANT
GUIDE PIN 3.2X343 (PIN) ×2 IMPLANT
KIT BASIN OR (CUSTOM PROCEDURE TRAY) ×1 IMPLANT
KIT TURNOVER KIT B (KITS) ×1 IMPLANT
NAIL TRIGEN INTERTAN 10X18CM (Nail) IMPLANT
NS IRRIG 1000ML POUR BTL (IV SOLUTION) ×1 IMPLANT
PACK GENERAL/GYN (CUSTOM PROCEDURE TRAY) ×1 IMPLANT
PAD ARMBOARD 7.5X6 YLW CONV (MISCELLANEOUS) ×2 IMPLANT
PIN GUIDE 3.2X343MM (PIN) IMPLANT
SCREW LAG COMPR KIT 115/110 (Screw) IMPLANT
SCREW LAG TRIG 11X110 (Screw) IMPLANT
SCREW TRIGEN LOW PROF 5.0X32.5 (Screw) IMPLANT
STAPLER VISISTAT 35W (STAPLE) ×1 IMPLANT
SUT MON AB 2-0 CT1 36 (SUTURE) ×1 IMPLANT
SUT VIC AB 0 CT1 27XBRD ANBCTR (SUTURE) IMPLANT
SUT VIC AB 1 CT1 27XBRD ANBCTR (SUTURE) IMPLANT
SUT VIC AB 2-0 CT1 TAPERPNT 27 (SUTURE) IMPLANT
TOWEL GREEN STERILE (TOWEL DISPOSABLE) ×1 IMPLANT
TOWEL GREEN STERILE FF (TOWEL DISPOSABLE) ×1 IMPLANT
WATER STERILE IRR 1000ML POUR (IV SOLUTION) ×1 IMPLANT

## 2023-05-26 NOTE — Plan of Care (Signed)

## 2023-05-26 NOTE — Anesthesia Postprocedure Evaluation (Signed)
 Anesthesia Post Note  Patient: Peggy Mendez  Procedure(s) Performed: LEFT HIP CEPHALOMEDULLARY NAIL (Left)     Patient location during evaluation: PACU Anesthesia Type: General Level of consciousness: patient cooperative, oriented and sedated Pain management: pain level controlled Vital Signs Assessment: post-procedure vital signs reviewed and stable Respiratory status: spontaneous breathing, nonlabored ventilation, respiratory function stable and patient connected to nasal cannula oxygen Cardiovascular status: blood pressure returned to baseline and stable Postop Assessment: no apparent nausea or vomiting Anesthetic complications: no   No notable events documented.  Last Vitals:  Vitals:   05/26/23 1950 05/26/23 2000  BP:  (!) 144/66  Pulse: 76 71  Resp: 20 13  Temp:  36.7 C  SpO2: 97% 94%    Last Pain:  Vitals:   05/26/23 2000  TempSrc:   PainSc: Asleep                 Ajene Carchi,E. Mazy Culton

## 2023-05-26 NOTE — Progress Notes (Signed)
 PROGRESS NOTE    Peggy Mendez  WUJ:[id number] DOB: 22-Jan-1927 DOA: 05/25/2023 PCP: Wanda Plump, MD   Brief Narrative: 89 year old with past medical history significant for A-fib, heart block status post pacemaker, chronic anticoagulation, lives independently, has a history of repeated falls, fell the day of admission, lost her balance fell down and twisted her left lower extremity.  Evaluation in the ED she was found to have left femoral fracture. She has a history of vertigo prone to motion sickness.  She received Dilaudid in the ER for pain.  Assessment & Plan:   Principal Problem:   Femoral fracture (HCC) Active Problems:   Essential hypertension   PACEMAKER-[employer]   Laceration of arm   Leukocytosis   Osteoporosis  1-Left Femoral Fracture: Presented after a fall, she has a history of recurrent falls. Pain management Ortho Consulted, plan for Sx today  PRN Dilaudid.  2-Osteoporosis: Normal Vitamin D. Follow up Out patient On calcium supplements.   Leukocytosis: UA negative  for infection  Laceration of arm: Local care  Chronic A-fib, heart block status post pacemaker On Metoprolol  Essential hypertension On metoprolol  Leukocytosis. UA negative  Vertigo; balance issues. Check B 12  Estimated body mass index is 20.37 kg/m as calculated from the following:   Height as of this encounter: 5\' 3"  (1.6 m).   Weight as of this encounter: 52.2 kg.   DVT prophylaxis: SCD Code Status: Full code Family Communication: Care discussed with patient. Disposition Plan:  Status is: Inpatient Remains inpatient appropriate because: management femoral fracture    Consultants:  Ortho  Procedures:  none  Antimicrobials:    Subjective: She report balance issues at home. Report hip pain   Objective: Vitals:   05/25/23 1930 05/25/23 2001 05/25/23 2111 05/26/23 0500  BP: 123/81 (!) 122/100 135/70 124/78  Pulse: 73 74 81 67  Resp: 13 (!) 22 19 18    Temp:  98 F (36.7 C) 97.7 F (36.5 C) 98 F (36.7 C)  TempSrc:  Oral Oral Oral  SpO2: 100% 100% 100% 95%  Weight:      Height:       No intake or output data in the 24 hours ending 05/26/23 0728 Filed Weights   05/25/23 1638  Weight: 52.2 kg    Examination:  General exam: Appears calm and comfortable  Respiratory system: Clear to auscultation. Respiratory effort normal. Cardiovascular system: S1 & S2 heard, RRR. No JVD, murmurs, rubs, gallops or clicks. No pedal edema. Gastrointestinal system: Abdomen is nondistended, soft and nontender. No organomegaly or masses felt. Normal bowel sounds heard. Central nervous system: Alert and oriented.  Extremities: Left leg shorter   Data Reviewed: I have personally reviewed following labs and imaging studies  CBC: Recent Labs  Lab 05/25/23 1715 05/26/23 0450  WBC 10.9* 13.8*  HGB 12.8 10.9*  HCT 39.3 32.5*  MCV 87.9 84.9  PLT 195 183   Basic Metabolic Panel: Recent Labs  Lab 05/25/23 1715 05/26/23 0450  NA 139 137  K 3.7 4.4  CL 102 102  CO2 24 23  GLUCOSE 150* 113*  BUN 19 14  CREATININE 0.77 0.60  CALCIUM 9.1 9.2  PHOS  --  4.3   GFR: Estimated Creatinine Clearance: 35.4 mL/min (by C-G formula based on SCr of 0.6 mg/dL). Liver Function Tests: No results for input(s): "AST", "ALT", "ALKPHOS", "BILITOT", "PROT", "ALBUMIN" in the last 168 hours. No results for input(s): "LIPASE", "AMYLASE" in the last 168 hours. No results for input(s): "AMMONIA" in  the last 168 hours. Coagulation Profile: Recent Labs  Lab 05/25/23 1715 05/26/23 0450  INR 1.1 1.1   Cardiac Enzymes: No results for input(s): "CKTOTAL", "CKMB", "CKMBINDEX", "TROPONINI" in the last 168 hours. BNP (last 3 results) No results for input(s): "PROBNP" in the last 8760 hours. HbA1C: No results for input(s): "HGBA1C" in the last 72 hours. CBG: No results for input(s): "GLUCAP" in the last 168 hours. Lipid Profile: No results for input(s): "CHOL",  "HDL", "LDLCALC", "TRIG", "CHOLHDL", "LDLDIRECT" in the last 72 hours. Thyroid Function Tests: Recent Labs    05/26/23 0450  TSH 2.113   Anemia Panel: No results for input(s): "VITAMINB12", "FOLATE", "FERRITIN", "TIBC", "IRON", "RETICCTPCT" in the last 72 hours. Sepsis Labs: No results for input(s): "PROCALCITON", "LATICACIDVEN" in the last 168 hours.  No results found for this or any previous visit (from the past 240 hours).       Radiology Studies: CT Head Wo Contrast Result Date: 05/25/2023 CLINICAL DATA:  Fall, on anticoagulation. EXAM: CT HEAD WITHOUT CONTRAST TECHNIQUE: Contiguous axial images were obtained from the base of the skull through the vertex without intravenous contrast. RADIATION DOSE REDUCTION: This exam was performed according to the departmental dose-optimization program which includes automated exposure control, adjustment of the mA and/or kV according to patient size and/or use of iterative reconstruction technique. COMPARISON:  Head CT 09/11/2021 FINDINGS: Brain: No intracranial hemorrhage, mass effect, or midline shift. Stable degree of atrophy and chronic small vessel ischemia. No hydrocephalus. The basilar cisterns are patent. No evidence of territorial infarct or acute ischemia. No extra-axial or intracranial fluid collection. Vascular: Atherosclerosis of skullbase vasculature without hyperdense vessel or abnormal calcification. Skull: No fracture or focal lesion. Sinuses/Orbits: No acute finding. Other: None. IMPRESSION: 1. No acute intracranial abnormality. No skull fracture. 2. Stable atrophy and chronic small vessel ischemia. Electronically Signed   By: Narda Rutherford M.D.   On: 05/25/2023 17:49   DG Humerus Right Result Date: 05/25/2023 CLINICAL DATA:  Fall with abrasion over right forearm. EXAM: RIGHT HUMERUS - 2+ VIEW COMPARISON:  None Available. FINDINGS: There is no evidence of fracture or other focal bone lesions. Cortical margins of the humerus are  intact. Chronic change about the elbow. Chronic change about the glenohumeral joint. Soft tissues are unremarkable. IMPRESSION: No fracture of the humerus. Electronically Signed   By: Narda Rutherford M.D.   On: 05/25/2023 17:32   DG HIP UNILAT W OR W/O PELVIS 2-3 VIEWS LEFT Result Date: 05/25/2023 CLINICAL DATA:  Pain after fall. EXAM: DG HIP (WITH OR WITHOUT PELVIS) 2-3V LEFT; LEFT FEMUR 2 VIEWS COMPARISON:  None Available. FINDINGS: Pelvis and hip: Comminuted displaced intertrochanteric left proximal femur fracture. There is mild proximal migration of the femoral shaft. No hip dislocation. No additional fracture of the pelvis. Pubic rami are intact. Pubic symphysis and sacroiliac joints are congruent. Moderate right hip osteoarthritis with joint space narrowing and spurring. Femur: Proximal femur fracture as described above. The distal femur is intact. The bones are subjectively under mineralized. Knee osteoarthritis, no knee dislocation. Vascular calcifications are seen. IMPRESSION: Comminuted displaced intertrochanteric left proximal femur fracture. Electronically Signed   By: Narda Rutherford M.D.   On: 05/25/2023 17:31   DG Femur Min 2 Views Left Result Date: 05/25/2023 CLINICAL DATA:  Pain after fall. EXAM: DG HIP (WITH OR WITHOUT PELVIS) 2-3V LEFT; LEFT FEMUR 2 VIEWS COMPARISON:  None Available. FINDINGS: Pelvis and hip: Comminuted displaced intertrochanteric left proximal femur fracture. There is mild proximal migration of the femoral shaft. No  hip dislocation. No additional fracture of the pelvis. Pubic rami are intact. Pubic symphysis and sacroiliac joints are congruent. Moderate right hip osteoarthritis with joint space narrowing and spurring. Femur: Proximal femur fracture as described above. The distal femur is intact. The bones are subjectively under mineralized. Knee osteoarthritis, no knee dislocation. Vascular calcifications are seen. IMPRESSION: Comminuted displaced intertrochanteric left  proximal femur fracture. Electronically Signed   By: Narda Rutherford M.D.   On: 05/25/2023 17:31   DG Chest 1 View Result Date: 05/25/2023 CLINICAL DATA:  Pain after fall. EXAM: CHEST  1 VIEW COMPARISON:  None Available. FINDINGS: Dual lead left-sided pacemaker in place. The cardiomediastinal contours are normal. The lungs are clear. Pulmonary vasculature is normal. No consolidation, pleural effusion, or pneumothorax. No acute osseous abnormalities are seen. IMPRESSION: No acute findings or evidence of traumatic injury. Electronically Signed   By: Narda Rutherford M.D.   On: 05/25/2023 17:29        Scheduled Meds:  acetaminophen  650 mg Oral Q4H   calcium-vitamin D  1 tablet Oral Q lunch   celecoxib  100 mg Oral BID   cholecalciferol  1,000 Units Oral Daily   ezetimibe  10 mg Oral Daily   metoprolol tartrate  12.5 mg Oral BID   scopolamine  1 patch Transdermal Once   senna-docusate  2 tablet Oral BID   sodium chloride flush  3 mL Intravenous Q12H   Continuous Infusions:  sodium chloride 75 mL/hr at 05/25/23 2232     LOS: 1 day    Time spent: 35 minutes    Lakesha Levinson A Evalise Abruzzese, MD [facility]   If 7PM-7AM, please contact night-coverage [email or web address]  05/26/2023, 7:28 AM

## 2023-05-26 NOTE — Anesthesia Procedure Notes (Signed)
 Procedure Name: Intubation Date/Time: 05/26/2023 5:29 PM  Performed by: Darryl Nestle, CRNAPre-anesthesia Checklist: Patient identified, Emergency Drugs available, Suction available and Patient being monitored Patient Re-evaluated:Patient Re-evaluated prior to induction Oxygen Delivery Method: Circle system utilized Preoxygenation: Pre-oxygenation with 100% oxygen Induction Type: IV induction Ventilation: Mask ventilation without difficulty Laryngoscope Size: Mac and 3 Grade View: Grade I Tube type: Oral Tube size: 6.5 mm Number of attempts: 1 Airway Equipment and Method: Stylet and Oral airway Placement Confirmation: ETT inserted through vocal cords under direct vision, positive ETCO2 and breath sounds checked- equal and bilateral Secured at: 21 cm Tube secured with: Tape Dental Injury: Teeth and Oropharynx as per pre-operative assessment

## 2023-05-26 NOTE — Consult Note (Signed)
 [facility]  ORTHOPAEDIC CONSULTATION  REQUESTING PHYSICIAN: Regalado, Belkys A, MD  ASSESSMENT AND PLAN: 89 y.o. female with the following: Left Hip Intertrochanteric femur fracture  This patient requires inpatient admission to the hospitalist, to include preoperative clearance and perioperative medical management  - Weight Bearing Status/Activity: NWB Left lower extremity  - Additional recommended labs/tests: none  -VTE Prophylaxis: Please hold Eliquis prior to OR; to resume POD#1 at the discretion of the primary team  - Pain control: Recommend PO pain medications PRN; judicious use of narcotics  - Follow-up plan: F/u 10-14 days postop  -Procedures: Plan for OR once patient has been medically optimized  Plan for Left Cephalomedullary nail     Chief Complaint: Left hip pain  HPI: Peggy Mendez is a 89 y.o. female with PMH HLD, HTN, A-fib on eliquis, AV block with pacemaker who had mechanical fall. who presented to the ED for evaluation after sustaining a mechanical fall.  She is complaining of Left hip pain. Prior to this fall she lived independently at home and did not use any assistive devices. She also sustained a superficial laceration to her right upper arm. Denies LOC. Denies numbness or tingling  Past Medical History:  Diagnosis Date   Cataract    REMOVED   History of colonic polyps    Hyperlipidemia    Hypertension    Osteopenia    Paroxysmal atrial fibrillation (HCC)    Sick sinus syndrome (HCC)    Skin cancer 2010   Left leg, SCC, sees derm   Syncope 11/2005   Past Surgical History:  Procedure Laterality Date   APPENDECTOMY     BREAST BIOPSY Bilateral    several, 11/2014 showed fibrocystic changes, no evidence of malignancy   BREAST BIOPSY Right 11/2015   cataracts Bilateral    COLONOSCOPY W/ BIOPSIES     EP IMPLANTABLE DEVICE N/A 11/27/2015   MDT Adapta L gen change by Dr Johney Frame   HEMORRHOID SURGERY     PPM  2008   MDT dual chamber PPM implanted by Dr  Reyes Ivan for sick sinus and intermittent CHB   SKIN CANCER EXCISION Right 03/2022   R face   TOTAL ABDOMINAL HYSTERECTOMY     oophorectomy   US ECHOCARDIOGRAPHY  12/08/2005   EF 55-60%   Social History   Socioeconomic History   Marital status: Single    Spouse name: Not on file   Number of children: 0   Years of education: Not on file   Highest education level: Not on file  Occupational History   Occupation: retired     Associate Professor: RETIRED  Tobacco Use   Smoking status: Former    Current packs/day: 0.00    Types: Cigarettes    Quit date: 08/23/1988    Years since quitting: 34.7   Smokeless tobacco: Never   Tobacco comments:    >20 yrs ago, used to smoke ~ 1 ppd  Vaping Use   Vaping status: Never Used  Substance and Sexual Activity   Alcohol use: Yes    Comment: socially   Drug use: Yes    Types: IV   Sexual activity: Not on file  Other Topics Concern   Not on file  Social History Narrative   Lives by herself, in a condominium   Still drives.   Lost her  twin sister 12/2019   Has one great niece in town, communicate w/ extended family members     Brother in Social worker passed 11-2020  Social Drivers of Corporate investment banker Strain: Not on file  Food Insecurity: No Food Insecurity (05/25/2023)   Hunger Vital Sign    Worried About Running Out of Food in the Last Year: Never true    Ran Out of Food in the Last Year: Never true  Transportation Needs: No Transportation Needs (05/25/2023)   PRAPARE - Administrator, Civil Service (Medical): No    Lack of Transportation (Non-Medical): No  Physical Activity: Not on file  Stress: Not on file  Social Connections: Socially Isolated (05/25/2023)   Social Connection and Isolation Panel [NHANES]    Frequency of Communication with Friends and Family: Once a week    Frequency of Social Gatherings with Friends and Family: Once a week    Attends Religious Services: Never    Database administrator or Organizations: No     Attends Engineer, structural: Never    Marital Status: Never married   Family History  Problem Relation Age of Onset   Heart failure Mother    Heart disease Father    Breast cancer Sister 85       recur @ 26   Hypertension Sister    Prostate cancer Brother    Coronary artery disease Other        F, brothers x 2other family members   Colon cancer Neg Hx    Diabetes Neg Hx    Allergies  Allergen Reactions   Lisinopril Cough   Anesthetics, Ester Nausea And Vomiting    All Anesthetics   Other Dermatitis and Rash    Tegaderm (clear occlusive dressing) Pt was miserable   Prior to Admission medications   Medication Sig Start Date End Date Taking? Authorizing Provider  Calcium Carbonate-Vitamin D (CALCIUM 600/VITAMIN D PO) Take 1 capsule by mouth daily with lunch.   Yes [provider]  cholecalciferol (VITAMIN D) 1000 units tablet Take 1,000 Units by mouth daily.   Yes [provider]  ELIQUIS 2.5 MG TABS tablet TAKE 1 TABLET BY MOUTH TWICE  DAILY 11/08/22  Yes [place], Peter M, MD  ezetimibe (ZETIA) 10 MG tablet TAKE 1 TABLET BY MOUTH DAILY 04/07/23  Yes [place], Peter M, MD  losartan (COZAAR) 50 MG tablet TAKE 1 TABLET BY MOUTH DAILY 04/07/23  Yes [place], Peter M, MD  metoprolol tartrate (LOPRESSOR) 25 MG tablet TAKE ONE-HALF TABLET BY MOUTH  TWICE DAILY 04/07/23  Yes [place], Peter M, MD  Misc Natural Products (TART CHERRY ADVANCED) CAPS Take 1 capsule by mouth at bedtime.   Yes [provider]  multivitamin [facility]) per tablet Take 1 tablet by mouth daily.   Yes [provider]  Omega-3 Fatty Acids (FISH OIL) 1200 MG CAPS Take 1,200 mg by mouth daily as needed.   Yes [provider]    Family History Reviewed and non-contributory, no pertinent history of problems with bleeding or anesthesia    Review of Systems No fevers or chills No numbness or tingling No chest pain No shortness of breath No bowel or bladder  dysfunction No GI distress No headaches    OBJECTIVE  Vitals:Patient Vitals for the past 8 hrs:  BP Temp Temp src Pulse Resp SpO2  05/26/23 0747 (!) 148/81 97.6 F (36.4 C) -- 90 17 98 %  05/26/23 0500 124/78 98 F (36.7 C) Oral 67 18 95 %   General: Alert, no acute distress Cardiovascular: Extremities are warm Respiratory: No cyanosis, no use of accessory musculature Skin:  No lesions in the area of chief complaint  Neurologic: Sensation intact distally  Psychiatric: Patient is competent for consent with normal mood and affect Lymphatic: No swelling obvious and reported other than the area involved in the exam below Extremities  Right UE 2cm skin tear over anterior upper arm No active bleeding No bony TTP Full ROM NVI  Left LE: Extremity held in a fixed position.  ROM deferred due to known fracture.  Sensation is intact distally in the sural, saphenous, DP, SP, and plantar nerve distribution. 2+ DP pulse.  Toes are WWP.  Active motion intact in the TA/EHL/GS. Right LE: Sensation is intact distally in the sural, saphenous, DP, SP, and plantar nerve distribution. 2+ DP pulse.  Toes are WWP.  Active motion intact in the TA/EHL/GS. Tolerates gentle ROM of the hip.  No pain with axial loading.     Test Results Imaging CT Head Wo Contrast Result Date: 05/25/2023 CLINICAL DATA:  Fall, on anticoagulation. EXAM: CT HEAD WITHOUT CONTRAST TECHNIQUE: Contiguous axial images were obtained from the base of the skull through the vertex without intravenous contrast. RADIATION DOSE REDUCTION: This exam was performed according to the departmental dose-optimization program which includes automated exposure control, adjustment of the mA and/or kV according to patient size and/or use of iterative reconstruction technique. COMPARISON:  Head CT 09/11/2021 FINDINGS: Brain: No intracranial hemorrhage, mass effect, or midline shift. Stable degree of atrophy and chronic small vessel ischemia. No  hydrocephalus. The basilar cisterns are patent. No evidence of territorial infarct or acute ischemia. No extra-axial or intracranial fluid collection. Vascular: Atherosclerosis of skullbase vasculature without hyperdense vessel or abnormal calcification. Skull: No fracture or focal lesion. Sinuses/Orbits: No acute finding. Other: None. IMPRESSION: 1. No acute intracranial abnormality. No skull fracture. 2. Stable atrophy and chronic small vessel ischemia. Electronically Signed   By: Narda Rutherford M.D.   On: 05/25/2023 17:49   DG Humerus Right Result Date: 05/25/2023 CLINICAL DATA:  Fall with abrasion over right forearm. EXAM: RIGHT HUMERUS - 2+ VIEW COMPARISON:  None Available. FINDINGS: There is no evidence of fracture or other focal bone lesions. Cortical margins of the humerus are intact. Chronic change about the elbow. Chronic change about the glenohumeral joint. Soft tissues are unremarkable. IMPRESSION: No fracture of the humerus. Electronically Signed   By: Narda Rutherford M.D.   On: 05/25/2023 17:32   DG HIP UNILAT W OR W/O PELVIS 2-3 VIEWS LEFT Result Date: 05/25/2023 CLINICAL DATA:  Pain after fall. EXAM: DG HIP (WITH OR WITHOUT PELVIS) 2-3V LEFT; LEFT FEMUR 2 VIEWS COMPARISON:  None Available. FINDINGS: Pelvis and hip: Comminuted displaced intertrochanteric left proximal femur fracture. There is mild proximal migration of the femoral shaft. No hip dislocation. No additional fracture of the pelvis. Pubic rami are intact. Pubic symphysis and sacroiliac joints are congruent. Moderate right hip osteoarthritis with joint space narrowing and spurring. Femur: Proximal femur fracture as described above. The distal femur is intact. The bones are subjectively under mineralized. Knee osteoarthritis, no knee dislocation. Vascular calcifications are seen. IMPRESSION: Comminuted displaced intertrochanteric left proximal femur fracture. Electronically Signed   By: Narda Rutherford M.D.   On: 05/25/2023 17:31    DG Femur Min 2 Views Left Result Date: 05/25/2023 CLINICAL DATA:  Pain after fall. EXAM: DG HIP (WITH OR WITHOUT PELVIS) 2-3V LEFT; LEFT FEMUR 2 VIEWS COMPARISON:  None Available. FINDINGS: Pelvis and hip: Comminuted displaced intertrochanteric left proximal femur fracture. There is mild proximal migration of the femoral shaft. No hip dislocation.  No additional fracture of the pelvis. Pubic rami are intact. Pubic symphysis and sacroiliac joints are congruent. Moderate right hip osteoarthritis with joint space narrowing and spurring. Femur: Proximal femur fracture as described above. The distal femur is intact. The bones are subjectively under mineralized. Knee osteoarthritis, no knee dislocation. Vascular calcifications are seen. IMPRESSION: Comminuted displaced intertrochanteric left proximal femur fracture. Electronically Signed   By: Narda Rutherford M.D.   On: 05/25/2023 17:31   DG Chest 1 View Result Date: 05/25/2023 CLINICAL DATA:  Pain after fall. EXAM: CHEST  1 VIEW COMPARISON:  None Available. FINDINGS: Dual lead left-sided pacemaker in place. The cardiomediastinal contours are normal. The lungs are clear. Pulmonary vasculature is normal. No consolidation, pleural effusion, or pneumothorax. No acute osseous abnormalities are seen. IMPRESSION: No acute findings or evidence of traumatic injury. Electronically Signed   By: Narda Rutherford M.D.   On: 05/25/2023 17:29   Labs cbc Recent Labs    05/25/23 1715 05/26/23 0450  WBC 10.9* 13.8*  HGB 12.8 10.9*  HCT 39.3 32.5*  PLT 195 183    Labs inflam No results for input(s): "CRP" in the last 72 hours.  Invalid input(s): "ESR"  Labs coag Recent Labs    05/25/23 1715 05/26/23 0450  INR 1.1 1.1    Recent Labs    05/25/23 1715 05/26/23 0450  NA 139 137  K 3.7 4.4  CL 102 102  CO2 24 23  GLUCOSE 150* 113*  BUN 19 14  CREATININE 0.77 0.60  CALCIUM 9.1 9.2

## 2023-05-26 NOTE — Consult Note (Signed)
 WOC Nurse Consult Note: Reason for Consult: Requested to assess a wound on right arm Wound type: Skin tear type 2 (partial flap loss that cannot be repositioned). Pressure Injury POA: NA Measurement: 5 x 2 cm x 0.1 cm Wound bed: 100% red Drainage (amount, consistency, odor) Scant amount, no odor. Periwound: intact, fragile skin. Dressing procedure/placement/frequency: Apply foam silicon dressing, change every 3 days.  WOC team will not plan to follow further.  Please reconsult if further assistance is needed. Thank-you,  Denyse Amass BSN, RN, [employer], WOC  (Pager: [phone])

## 2023-05-26 NOTE — Transfer of Care (Signed)
 Immediate Anesthesia Transfer of Care Note  Patient: Peggy Mendez  Procedure(s) Performed: LEFT HIP CEPHALOMEDULLARY NAIL (Left)  Patient Location: PACU  Anesthesia Type:General  Level of Consciousness: awake and alert   Airway & Oxygen Therapy: Patient Spontanous Breathing and Patient connected to nasal cannula oxygen  Post-op Assessment: Report given to RN and Post -op Vital signs reviewed and stable  Post vital signs: Reviewed and stable  Last Vitals:  Vitals Value Taken Time  BP 114/87 05/26/23 1917  Temp 97.6 F 05/26/23 1920  Pulse 76 05/26/23 1920  Resp 20 05/26/23 1920  SpO2 100 % 05/26/23 1920  Vitals shown include unfiled device data.  Last Pain:  Vitals:   05/26/23 1553  TempSrc: Oral  PainSc:          Complications: No notable events documented.

## 2023-05-27 DIAGNOSIS — S72142A Displaced intertrochanteric fracture of left femur, initial encounter for closed fracture: Secondary | ICD-10-CM | POA: Diagnosis not present

## 2023-05-27 LAB — CBC
HCT: 28.6 % — ABNORMAL LOW (ref 36.0–46.0)
Hemoglobin: 9.6 g/dL — ABNORMAL LOW (ref 12.0–15.0)
MCH: 28.8 pg (ref 26.0–34.0)
MCHC: 33.6 g/dL (ref 30.0–36.0)
MCV: 85.9 fL (ref 80.0–100.0)
Platelets: 157 10*3/uL (ref 150–400)
RBC: 3.33 MIL/uL — ABNORMAL LOW (ref 3.87–5.11)
RDW: 14.3 % (ref 11.5–15.5)
WBC: 11.5 10*3/uL — ABNORMAL HIGH (ref 4.0–10.5)
nRBC: 0 % (ref 0.0–0.2)

## 2023-05-27 LAB — BASIC METABOLIC PANEL
Anion gap: 8 (ref 5–15)
BUN: 14 mg/dL (ref 8–23)
CO2: 25 mmol/L (ref 22–32)
Calcium: 8.8 mg/dL — ABNORMAL LOW (ref 8.9–10.3)
Chloride: 103 mmol/L (ref 98–111)
Creatinine, Ser: 0.73 mg/dL (ref 0.44–1.00)
GFR, Estimated: >60 mL/min (ref >60)
Glucose, Bld: 130 mg/dL — ABNORMAL HIGH (ref 70–99)
Potassium: 4.4 mmol/L (ref 3.5–5.1)
Sodium: 136 mmol/L (ref 135–145)

## 2023-05-27 LAB — VITAMIN B12: Vitamin B-12: 639 pg/mL (ref 180–914)

## 2023-05-27 MED ORDER — APIXABAN 2.5 MG PO TABS
2.5000 mg | ORAL_TABLET | Freq: Two times a day (BID) | ORAL | Status: DC
  Administered 2023-05-27 – 2023-05-30 (×7): 2.5 mg via ORAL
  Filled 2023-05-27 (×7): qty 1

## 2023-05-27 NOTE — [place] FL2 (Signed)
 [place] MEDICAID FL2 LEVEL OF CARE FORM     IDENTIFICATION  Patient Name: Peggy Mendez [facility] Birthdate: 1927/01/23 Sex: female Admission Date (Current Location): 05/25/2023  [facility] and [place] Number:  Producer, television/film/video and Address:  [facility], [address], [place], [place] [address]      Provider Number: [id number]  Attending Physician Name and Address:  Alba Cory, MD  Relative Name and Phone Number:       Current Level of Care: Hospital Recommended Level of Care: Skilled Nursing Facility Prior Approval Number:    Date Approved/Denied:   PASRR Number: [id number]  Discharge Plan: SNF    Current Diagnoses: Patient Active Problem List   Diagnosis Date Noted   Laceration of arm 05/25/2023   Leukocytosis 05/25/2023   Femoral fracture (HCC) 05/25/2023   Osteoporosis 05/25/2023   Degenerative arthritis of knee, bilateral 03/17/2016   PCP NOTES >>>> 01/24/2015   Angiodysplasia of colon 11/27/2012   History of colonic polyps 11/12/2012   Rosacea 08/24/2011   AV block, 2nd degree 01/21/2011   Annual physical exam >>>>>>>>>>>>>>>>>>> 07/30/2010   PAROXYSMAL ATRIAL FIBRILLATION 04/22/2010   Hypercholesterolemia 10/03/2006   Essential hypertension 10/03/2006   Disorder of bone and cartilage 10/03/2006   Abnormal mammogram 10/03/2006   Personal history presenting hazards to health 10/03/2006   PACEMAKER-Medtronic 06/03/2006    Orientation RESPIRATION BLADDER Height & Weight     Self, Time, Situation, Place  Normal Continent Weight: 115 lb (52.2 kg) Height:  5\' 3"  (160 cm)  BEHAVIORAL SYMPTOMS/MOOD NEUROLOGICAL BOWEL NUTRITION STATUS      Continent Diet (See dc summary)  AMBULATORY STATUS COMMUNICATION OF NEEDS Skin   Limited Assist Verbally Surgical wounds, Other (Comment) (Closed incision on hip; skin tear on arm)                       Personal Care Assistance Level of Assistance  Bathing, Feeding, Dressing  Bathing Assistance: Maximum assistance Feeding assistance: Independent Dressing Assistance: Limited assistance     Functional Limitations Info             SPECIAL CARE FACTORS FREQUENCY  PT (By licensed PT), OT (By licensed OT)     PT Frequency: 5x/week OT Frequency: 5x/week            Contractures Contractures Info: Not present    Additional Factors Info  Code Status, Allergies Code Status Info: Full Allergies Info: Lisinopril, Anesthetics, Ester, Other           Current Medications (05/27/2023):  This is the current hospital active medication list Current Facility-Administered Medications  Medication Dose Route Frequency Provider Last Rate Last Admin   0.9 %  sodium chloride infusion   Intravenous Continuous Nolberto Hanlon, MD 75 mL/hr at 05/27/23 1049 New Bag at 05/27/23 1049   acetaminophen (TYLENOL) tablet 650 mg  650 mg Oral Q6H PRN Nolberto Hanlon, MD       Or   acetaminophen (TYLENOL) suppository 650 mg  650 mg Rectal Q6H PRN Nolberto Hanlon, MD       acetaminophen (TYLENOL) tablet 650 mg  650 mg Oral Q4H Nolberto Hanlon, MD   650 mg at 05/27/23 1030   apixaban (ELIQUIS) tablet 2.5 mg  2.5 mg Oral BID Regalado, Belkys A, MD       calcium-vitamin D (OSCAL WITH D) 500-5 MG-MCG per tablet 1 tablet  1 tablet Oral Q lunch Nolberto Hanlon, MD   1 tablet at  05/26/23 1202   ceFAZolin (ANCEF) IVPB 1 g/50 mL premix  1 g Intravenous Q8H Luci Bank, MD 100 mL/hr at 05/27/23 0014 1 g at 05/27/23 0014   cholecalciferol (VITAMIN D3) 25 MCG (1000 UNIT) tablet 1,000 Units  1,000 Units Oral Daily Nolberto Hanlon, MD   1,000 Units at 05/27/23 1021   ezetimibe (ZETIA) tablet 10 mg  10 mg Oral Daily Nolberto Hanlon, MD   10 mg at 05/27/23 1021   HYDROmorphone (DILAUDID) injection 0.5 mg  0.5 mg Intravenous Q3H PRN Nolberto Hanlon, MD       metoprolol tartrate (LOPRESSOR) tablet 12.5 mg  12.5 mg Oral BID Nolberto Hanlon, MD   12.5 mg at 05/27/23 1021   ondansetron [facility]) injection 4 mg  4 mg Intravenous Q6H PRN  Nolberto Hanlon, MD   4 mg at 05/26/23 2130   oxyCODONE (Oxy IR/ROXICODONE) immediate release tablet 5 mg  5 mg Oral Q4H PRN Nolberto Hanlon, MD       polyethylene glycol (MIRALAX / GLYCOLAX) packet 17 g  17 g Oral Daily PRN Nolberto Hanlon, MD       scopolamine (TRANSDERM-SCOP) 1 MG/3DAYS 1.5 mg  1 patch Transdermal Once Nolberto Hanlon, MD   1.5 mg at 05/25/23 2025   senna-docusate (Senokot-S) tablet 2 tablet  2 tablet Oral BID Nolberto Hanlon, MD   2 tablet at 05/27/23 1020   sodium chloride flush (NS) 0.9 % injection 3 mL  3 mL Intravenous Q12H Nolberto Hanlon, MD   3 mL at 05/26/23 2135     Discharge Medications: Please see discharge summary for a list of discharge medications.  Relevant Imaging Results:  Relevant Lab Results:   Additional Information SSN: [id number] [place], [place]

## 2023-05-27 NOTE — Progress Notes (Addendum)
.  Subjective: 1 Day Post-Op Procedure(s) (LRB): LEFT HIP CEPHALOMEDULLARY NAIL (Left)  Patient seen and examined. Pain much improved from prior to surgery. Discussed with patient her poor bone quality at time of surgery and need for protected weight bearing  Activity level:  PWB x25% Diet tolerance:  as tolerated Patient reports pain as moderate.    Objective: Vital signs in last 24 hours: Temp:  [97.5 F (36.4 C)-98.1 F (36.7 C)] 97.5 F (36.4 C) (02/22 0413) Pulse Rate:  [71-96] 86 (02/22 0413) Resp:  [13-26] 17 (02/22 0832) BP: (92-147)/(50-99) 92/50 (02/22 0832) SpO2:  [93 %-100 %] 98 % (02/22 0832)  Labs: Recent Labs    05/25/23 1715 05/26/23 0450 05/27/23 0427  HGB 12.8 10.9* 9.6*   Recent Labs    05/26/23 0450 05/27/23 0427  WBC 13.8* 11.5*  RBC 3.83* 3.33*  HCT 32.5* 28.6*  PLT 183 157   Recent Labs    05/26/23 0450 05/27/23 0427  NA 137 136  K 4.4 4.4  CL 102 103  CO2 23 25  BUN 14 14  CREATININE 0.60 0.73  GLUCOSE 113* 130*  CALCIUM 9.2 8.8*   Recent Labs    05/25/23 1715 05/26/23 0450  INR 1.1 1.1    Physical Exam:  Neurologically intact Neurovascular intact Sensation intact distally Intact pulses distally Dorsiflexion/Plantar flexion intact Incision: dressing C/D/I Compartment soft  Assessment/Plan:  1 Day Post-Op Procedure(s) (LRB): LEFT HIP CEPHALOMEDULLARY NAIL (Left) PWB 25% for 2 weeks then can progress to WBAT PT/OT DVT ppx: can restart home Eliquis today Ancef q8 for 24 hours for surgical ppx Pain control as needed Hgb 9.6 from 10.9. Due to acute blood loss anemia from surgery and fracture. Continue to trend CBC and transfuse as needed   Luci Bank 05/27/2023, 10:07 AM

## 2023-05-27 NOTE — Progress Notes (Signed)
 Transition of Care [facility]) - CAGE-AID Screening   Patient Details  Name: Peggy Mendez MRN: [id number] Date of Birth: 04-Oct-1926  Transition of Care [facility] CM/SW Contact:    Leota Sauers, RN Phone Number: 05/27/2023, 8:17 PM   Clinical Narrative:  Patient denies the use of alcohol and illicit drugs. Resources not given at this time.   CAGE-AID Screening:    Have You Ever Felt You Ought to Cut Down on Your Drinking or Drug Use?: No Have People Annoyed You By Critizing Your Drinking Or Drug Use?: No Have You Felt Bad Or Guilty About Your Drinking Or Drug Use?: No Have You Ever Had a Drink or Used Drugs First Thing In The Morning to Steady Your Nerves or to Get Rid of a Hangover?: No CAGE-AID Score: 0

## 2023-05-27 NOTE — Progress Notes (Addendum)
 PROGRESS NOTE    Peggy Mendez  UJW:[id number] DOB: [birth year] DOA: 05/25/2023 PCP: Wanda Plump, MD   Brief Narrative: 89 year old with past medical history significant for A-fib, heart block status post pacemaker, chronic anticoagulation, lives independently, has a history of repeated falls, fell the day of admission, lost her balance fell down and twisted her left lower extremity.  Evaluation in the ED she was found to have left femoral fracture. She has a history of vertigo prone to motion sickness.  She received Dilaudid in the ER for pain.  Assessment & Plan:   Principal Problem:   Femoral fracture (HCC) Active Problems:   Essential hypertension   PACEMAKER-[employer]tr[employer]   Laceration of arm   Leukocytosis   Osteoporosis  1-Left Femoral Fracture: Presented after a fall, she has a history of recurrent falls. Pain management Ortho Consulted, underwent left cephalomedullary nail on 2/21 by Dr. Thornell Mule PRN Dilaudid. Partial weightbearing 25% for 2 weeks then can progress to WBAT  2-Osteoporosis: Normal Vitamin D. Follow up Out patient On calcium supplements.   Leukocytosis: UA negative  for infection Trending down  Laceration of arm: Local care  Chronic A-fib, heart block status post pacemaker On Metoprolol Ok to resume Eliquis today per Ortho  Essential hypertension On metoprolol.  Vertigo; Balance issues.  B 12; 639.  Acute Blood loss anemia; -post sx expected.of no clinical significance.  Estimated body mass index is 20.37 kg/m as calculated from the following:   Height as of this encounter: 5\' 3"  (1.6 m).   Weight as of this encounter: 52.2 kg.   DVT prophylaxis: SCD Code Status: Full code Family Communication: Care discussed with patient. Grandniece updated Disposition Plan:  Status is: Inpatient Remains inpatient appropriate because: management femoral fracture    Consultants:  Ortho  Procedures:  none  Antimicrobials:     Subjective: She is alert. Pain controlled.   Objective: Vitals:   05/26/23 2000 05/26/23 2021 05/27/23 0413 05/27/23 0832  BP: (!) 144/66 (!) 147/88 108/70 (!) 92/50  Pulse: 71 91 86   Resp: 13 18 18 17   Temp: 98.1 F (36.7 C) (!) 97.5 F (36.4 C) (!) 97.5 F (36.4 C)   TempSrc:      SpO2: 94% 100% 100% 98%  Weight:      Height:        Intake/Output Summary (Last 24 hours) at 05/27/2023 1212 Last data filed at 05/27/2023 5621 Gross per 24 hour  Intake 1449.32 ml  Output 350 ml  Net 1099.32 ml   Filed Weights   05/25/23 1638  Weight: 52.2 kg    Examination:  General exam: NAD Respiratory system: CTA Cardiovascular system: S 1, S 2 RRR Gastrointestinal system: BS present,soft, nt Central nervous system: Alert Extremities: Left left dressing   Data Reviewed: I have personally reviewed following labs and imaging studies  CBC: Recent Labs  Lab 05/25/23 1715 05/26/23 0450 05/27/23 0427  WBC 10.9* 13.8* 11.5*  HGB 12.8 10.9* 9.6*  HCT 39.3 32.5* 28.6*  MCV 87.9 84.9 85.9  PLT 195 183 157   Basic Metabolic Panel: Recent Labs  Lab 05/25/23 1715 05/26/23 0450 05/27/23 0427  NA 139 137 136  K 3.7 4.4 4.4  CL 102 102 103  CO2 24 23 25   GLUCOSE 150* 113* 130*  BUN 19 14 14   CREATININE 0.77 0.60 0.73  CALCIUM 9.1 9.2 8.8*  PHOS  --  4.3  --    GFR: Estimated Creatinine Clearance: 35.4 mL/min (by C-G formula  based on SCr of 0.73 mg/dL). Liver Function Tests: No results for input(s): "AST", "ALT", "ALKPHOS", "BILITOT", "PROT", "ALBUMIN" in the last 168 hours. No results for input(s): "LIPASE", "AMYLASE" in the last 168 hours. No results for input(s): "AMMONIA" in the last 168 hours. Coagulation Profile: Recent Labs  Lab 05/25/23 1715 05/26/23 0450  INR 1.1 1.1   Cardiac Enzymes: No results for input(s): "CKTOTAL", "CKMB", "CKMBINDEX", "TROPONINI" in the last 168 hours. BNP (last 3 results) No results for input(s): "PROBNP" in the last 8760  hours. HbA1C: No results for input(s): "HGBA1C" in the last 72 hours. CBG: No results for input(s): "GLUCAP" in the last 168 hours. Lipid Profile: No results for input(s): "CHOL", "HDL", "LDLCALC", "TRIG", "CHOLHDL", "LDLDIRECT" in the last 72 hours. Thyroid Function Tests: Recent Labs    05/26/23 0450  TSH 2.113   Anemia Panel: Recent Labs    05/27/23 0427  VITAMINB12 639   Sepsis Labs: No results for input(s): "PROCALCITON", "LATICACIDVEN" in the last 168 hours.  Recent Results (from the past 240 hours)  Surgical pcr screen     Status: None   Collection Time: 05/26/23  6:32 AM   Specimen: Nasal Mucosa; Nasal Swab  Result Value Ref Range Status   MRSA, PCR NEGATIVE NEGATIVE Final   Staphylococcus aureus NEGATIVE NEGATIVE Final    Comment: (NOTE) The Xpert SA Assay (FDA approved for NASAL specimens in patients 66 years of age and older), is one component of a comprehensive surveillance program. It is not intended to diagnose infection nor to guide or monitor treatment. Performed at [facility], [address] [place], [place] [address]          Radiology Studies: DG FEMUR MIN 2 VIEWS LEFT Result Date: 05/26/2023 CLINICAL DATA:  Elective surgery.  Left hip cephalomedullary nail. EXAM: LEFT FEMUR 2 VIEWS COMPARISON:  Left femur radiographs 05/25/2023 FINDINGS: Images were performed intraoperatively without the presence of a radiologist. The patient is undergoing cephalomedullary nail fixation of the previously seen intertrochanteric fracture. Improved alignment. Displaced lesser trochanter again noted. Total fluoroscopy images: 5 Total fluoroscopy time: 148 seconds Total dose: Radiation Exposure Index (as provided by the fluoroscopic device): 20.4 mGy air Kerma Please see intraoperative findings for further detail. IMPRESSION: Intraoperative fluoroscopy for left hip cephalomedullary nail fixation. Electronically Signed   By: Neita Garnet M.D.   On: 05/26/2023 21:04    DG C-Arm 1-60 Min-No Report Result Date: 05/26/2023 Fluoroscopy was utilized by the requesting physician.  No radiographic interpretation.   DG C-Arm 1-60 Min-No Report Result Date: 05/26/2023 Fluoroscopy was utilized by the requesting physician.  No radiographic interpretation.   CT Head Wo Contrast Result Date: 05/25/2023 CLINICAL DATA:  Fall, on anticoagulation. EXAM: CT HEAD WITHOUT CONTRAST TECHNIQUE: Contiguous axial images were obtained from the base of the skull through the vertex without intravenous contrast. RADIATION DOSE REDUCTION: This exam was performed according to the departmental dose-optimization program which includes automated exposure control, adjustment of the mA and/or kV according to patient size and/or use of iterative reconstruction technique. COMPARISON:  Head CT 09/11/2021 FINDINGS: Brain: No intracranial hemorrhage, mass effect, or midline shift. Stable degree of atrophy and chronic small vessel ischemia. No hydrocephalus. The basilar cisterns are patent. No evidence of territorial infarct or acute ischemia. No extra-axial or intracranial fluid collection. Vascular: Atherosclerosis of skullbase vasculature without hyperdense vessel or abnormal calcification. Skull: No fracture or focal lesion. Sinuses/Orbits: No acute finding. Other: None. IMPRESSION: 1. No acute intracranial abnormality. No skull fracture. 2.  Stable atrophy and chronic small vessel ischemia. Electronically Signed   By: Narda Rutherford M.D.   On: 05/25/2023 17:49   DG Humerus Right Result Date: 05/25/2023 CLINICAL DATA:  Fall with abrasion over right forearm. EXAM: RIGHT HUMERUS - 2+ VIEW COMPARISON:  None Available. FINDINGS: There is no evidence of fracture or other focal bone lesions. Cortical margins of the humerus are intact. Chronic change about the elbow. Chronic change about the glenohumeral joint. Soft tissues are unremarkable. IMPRESSION: No fracture of the humerus. Electronically Signed   By:  Narda Rutherford M.D.   On: 05/25/2023 17:32   DG HIP UNILAT W OR W/O PELVIS 2-3 VIEWS LEFT Result Date: 05/25/2023 CLINICAL DATA:  Pain after fall. EXAM: DG HIP (WITH OR WITHOUT PELVIS) 2-3V LEFT; LEFT FEMUR 2 VIEWS COMPARISON:  None Available. FINDINGS: Pelvis and hip: Comminuted displaced intertrochanteric left proximal femur fracture. There is mild proximal migration of the femoral shaft. No hip dislocation. No additional fracture of the pelvis. Pubic rami are intact. Pubic symphysis and sacroiliac joints are congruent. Moderate right hip osteoarthritis with joint space narrowing and spurring. Femur: Proximal femur fracture as described above. The distal femur is intact. The bones are subjectively under mineralized. Knee osteoarthritis, no knee dislocation. Vascular calcifications are seen. IMPRESSION: Comminuted displaced intertrochanteric left proximal femur fracture. Electronically Signed   By: Narda Rutherford M.D.   On: 05/25/2023 17:31   DG Femur Min 2 Views Left Result Date: 05/25/2023 CLINICAL DATA:  Pain after fall. EXAM: DG HIP (WITH OR WITHOUT PELVIS) 2-3V LEFT; LEFT FEMUR 2 VIEWS COMPARISON:  None Available. FINDINGS: Pelvis and hip: Comminuted displaced intertrochanteric left proximal femur fracture. There is mild proximal migration of the femoral shaft. No hip dislocation. No additional fracture of the pelvis. Pubic rami are intact. Pubic symphysis and sacroiliac joints are congruent. Moderate right hip osteoarthritis with joint space narrowing and spurring. Femur: Proximal femur fracture as described above. The distal femur is intact. The bones are subjectively under mineralized. Knee osteoarthritis, no knee dislocation. Vascular calcifications are seen. IMPRESSION: Comminuted displaced intertrochanteric left proximal femur fracture. Electronically Signed   By: Narda Rutherford M.D.   On: 05/25/2023 17:31   DG Chest 1 View Result Date: 05/25/2023 CLINICAL DATA:  Pain after fall. EXAM:  CHEST  1 VIEW COMPARISON:  None Available. FINDINGS: Dual lead left-sided pacemaker in place. The cardiomediastinal contours are normal. The lungs are clear. Pulmonary vasculature is normal. No consolidation, pleural effusion, or pneumothorax. No acute osseous abnormalities are seen. IMPRESSION: No acute findings or evidence of traumatic injury. Electronically Signed   By: Narda Rutherford M.D.   On: 05/25/2023 17:29        Scheduled Meds:  acetaminophen  650 mg Oral Q4H   apixaban  2.5 mg Oral BID   calcium-vitamin D  1 tablet Oral Q lunch   cholecalciferol  1,000 Units Oral Daily   ezetimibe  10 mg Oral Daily   metoprolol tartrate  12.5 mg Oral BID   scopolamine  1 patch Transdermal Once   senna-docusate  2 tablet Oral BID   sodium chloride flush  3 mL Intravenous Q12H   Continuous Infusions:  sodium chloride 75 mL/hr at 05/27/23 1049    ceFAZolin (ANCEF) IV 1 g (05/27/23 0014)     LOS: 2 days    Time spent: 35 minutes    Braylen Denunzio A Austen Oyster, MD [facility]   If 7PM-7AM, please contact night-coverage [email or web address]  05/27/2023, 12:12 PM

## 2023-05-28 DIAGNOSIS — S72142A Displaced intertrochanteric fracture of left femur, initial encounter for closed fracture: Secondary | ICD-10-CM | POA: Diagnosis not present

## 2023-05-28 LAB — CBC
HCT: 25.9 % — ABNORMAL LOW (ref 36.0–46.0)
Hemoglobin: 8.5 g/dL — ABNORMAL LOW (ref 12.0–15.0)
MCH: 28.7 pg (ref 26.0–34.0)
MCHC: 32.8 g/dL (ref 30.0–36.0)
MCV: 87.5 fL (ref 80.0–100.0)
Platelets: 147 10*3/uL — ABNORMAL LOW (ref 150–400)
RBC: 2.96 MIL/uL — ABNORMAL LOW (ref 3.87–5.11)
RDW: 14.6 % (ref 11.5–15.5)
WBC: 9.7 10*3/uL (ref 4.0–10.5)
nRBC: 0 % (ref 0.0–0.2)

## 2023-05-28 LAB — BASIC METABOLIC PANEL
Anion gap: 5 (ref 5–15)
BUN: 11 mg/dL (ref 8–23)
CO2: 24 mmol/L (ref 22–32)
Calcium: 8.3 mg/dL — ABNORMAL LOW (ref 8.9–10.3)
Chloride: 107 mmol/L (ref 98–111)
Creatinine, Ser: 0.72 mg/dL (ref 0.44–1.00)
GFR, Estimated: >60 mL/min (ref >60)
Glucose, Bld: 106 mg/dL — ABNORMAL HIGH (ref 70–99)
Potassium: 3.9 mmol/L (ref 3.5–5.1)
Sodium: 136 mmol/L (ref 135–145)

## 2023-05-28 MED ORDER — FOLIC ACID 1 MG PO TABS
1.0000 mg | ORAL_TABLET | Freq: Every day | ORAL | Status: DC
  Administered 2023-05-28 – 2023-05-30 (×3): 1 mg via ORAL
  Filled 2023-05-28 (×3): qty 1

## 2023-05-28 MED ORDER — POLYETHYLENE GLYCOL 3350 17 G PO PACK
17.0000 g | PACK | Freq: Two times a day (BID) | ORAL | Status: DC
  Administered 2023-05-28 – 2023-05-29 (×4): 17 g via ORAL
  Filled 2023-05-28 (×4): qty 1

## 2023-05-28 MED ORDER — POLYSACCHARIDE IRON COMPLEX 150 MG PO CAPS
150.0000 mg | ORAL_CAPSULE | Freq: Every day | ORAL | Status: DC
  Administered 2023-05-28 – 2023-05-30 (×3): 150 mg via ORAL
  Filled 2023-05-28 (×3): qty 1

## 2023-05-28 NOTE — Progress Notes (Signed)
 PROGRESS NOTE    Peggy Mendez  OZH:[id number] DOB: 1926/10/28 DOA: 05/25/2023 PCP: Wanda Plump, MD   Brief Narrative: 89 year old with past medical history significant for A-fib, heart block status post pacemaker, chronic anticoagulation, lives independently, has a history of repeated falls, fell the day of admission, lost her balance fell down and twisted her left lower extremity.  Evaluation in the ED she was found to have left femoral fracture. She has a history of vertigo prone to motion sickness.  She received Dilaudid in the ER for pain.  Assessment & Plan:   Principal Problem:   Femoral fracture (HCC) Active Problems:   Essential hypertension   PACEMAKER-[employer]   Laceration of arm   Leukocytosis   Osteoporosis  1-Left Femoral Fracture: Presented after a fall, she has a history of recurrent falls. Pain management Ortho Consulted, underwent left cephalomedullary nail on 2/21 by Dr. Thornell Mule PRN Dilaudid. Partial weightbearing 25% for 2 weeks then can progress to WBAT Needs rehab Monitor hb  2-Osteoporosis: Normal Vitamin D. Follow up Out patient On calcium supplements.   Leukocytosis: UA negative  for infection Trending down  Laceration of arm: Local care  Chronic A-fib, heart block status post pacemaker On Metoprolol Ok to resume Eliquis today per Ortho  Essential hypertension On metoprolol.  Vertigo; Balance issues.  B 12; 639.  Acute Blood loss anemia; -post sx expected.of no clinical significance. -hb down to 8.5 Start iron supplement, and folic acid  Acute Delirium, metabolic encephalopathy Post sx, in setting hospitalization. Grandniece noticed that patient was confused yesterday Delirium precaution Estimated body mass index is 20.37 kg/m as calculated from the following:   Height as of this encounter: 5\' 3"  (1.6 m).   Weight as of this encounter: 52.2 kg.   DVT prophylaxis: SCD Code Status: Full code Family Communication:  Care discussed with patient. Grandniece updated 2/22 Disposition Plan:  Status is: Inpatient Remains inpatient appropriate because: management femoral fracture    Consultants:  Ortho  Procedures:  none  Antimicrobials:    Subjective: She is alert, eating breakfast, denies dyspnea.  She report pain is controlled  Objective: Vitals:   05/27/23 1951 05/28/23 0510 05/28/23 0741 05/28/23 1440  BP: 117/62 130/66 139/70 (!) 127/53  Pulse: 92 80 83 87  Resp: 17 16 18 20   Temp: 98 F (36.7 C) 98.2 F (36.8 C) 98.2 F (36.8 C) 97.6 F (36.4 C)  TempSrc: Oral Oral  Oral  SpO2: 100% 98% 96% 97%  Weight:      Height:        Intake/Output Summary (Last 24 hours) at 05/28/2023 1449 Last data filed at 05/28/2023 0956 Gross per 24 hour  Intake 1743.83 ml  Output 1000 ml  Net 743.83 ml   Filed Weights   05/25/23 1638  Weight: 52.2 kg    Examination:  General exam: NAD Respiratory system: BL air movement. Cardiovascular system: S 1, S 2 RRR Gastrointestinal system: BS present, soft, nt Central nervous system: Alert, follows command Extremities: Left left dressing   Data Reviewed: I have personally reviewed following labs and imaging studies  CBC: Recent Labs  Lab 05/25/23 1715 05/26/23 0450 05/27/23 0427 05/28/23 0611  WBC 10.9* 13.8* 11.5* 9.7  HGB 12.8 10.9* 9.6* 8.5*  HCT 39.3 32.5* 28.6* 25.9*  MCV 87.9 84.9 85.9 87.5  PLT 195 183 157 147*   Basic Metabolic Panel: Recent Labs  Lab 05/25/23 1715 05/26/23 0450 05/27/23 0427 05/28/23 0611  NA 139 137 136 136  K 3.7 4.4 4.4 3.9  CL 102 102 103 107  CO2 24 23 25 24   GLUCOSE 150* 113* 130* 106*  BUN 19 14 14 11   CREATININE 0.77 0.60 0.73 0.72  CALCIUM 9.1 9.2 8.8* 8.3*  PHOS  --  4.3  --   --    GFR: Estimated Creatinine Clearance: 35.4 mL/min (by C-G formula based on SCr of 0.72 mg/dL). Liver Function Tests: No results for input(s): "AST", "ALT", "ALKPHOS", "BILITOT", "PROT", "ALBUMIN" in the  last 168 hours. No results for input(s): "LIPASE", "AMYLASE" in the last 168 hours. No results for input(s): "AMMONIA" in the last 168 hours. Coagulation Profile: Recent Labs  Lab 05/25/23 1715 05/26/23 0450  INR 1.1 1.1   Cardiac Enzymes: No results for input(s): "CKTOTAL", "CKMB", "CKMBINDEX", "TROPONINI" in the last 168 hours. BNP (last 3 results) No results for input(s): "PROBNP" in the last 8760 hours. HbA1C: No results for input(s): "HGBA1C" in the last 72 hours. CBG: No results for input(s): "GLUCAP" in the last 168 hours. Lipid Profile: No results for input(s): "CHOL", "HDL", "LDLCALC", "TRIG", "CHOLHDL", "LDLDIRECT" in the last 72 hours. Thyroid Function Tests: Recent Labs    05/26/23 0450  TSH 2.113   Anemia Panel: Recent Labs    05/27/23 0427  VITAMINB12 639   Sepsis Labs: No results for input(s): "PROCALCITON", "LATICACIDVEN" in the last 168 hours.  Recent Results (from the past 240 hours)  Surgical pcr screen     Status: None   Collection Time: 05/26/23  6:32 AM   Specimen: Nasal Mucosa; Nasal Swab  Result Value Ref Range Status   MRSA, PCR NEGATIVE NEGATIVE Final   Staphylococcus aureus NEGATIVE NEGATIVE Final    Comment: (NOTE) The Xpert SA Assay (FDA approved for NASAL specimens in patients 2 years of age and older), is one component of a comprehensive surveillance program. It is not intended to diagnose infection nor to guide or monitor treatment. Performed at [facility], [address] [place], [place] [address]          Radiology Studies: DG FEMUR MIN 2 VIEWS LEFT Result Date: 05/26/2023 CLINICAL DATA:  Elective surgery.  Left hip cephalomedullary nail. EXAM: LEFT FEMUR 2 VIEWS COMPARISON:  Left femur radiographs 05/25/2023 FINDINGS: Images were performed intraoperatively without the presence of a radiologist. The patient is undergoing cephalomedullary nail fixation of the previously seen intertrochanteric fracture. Improved  alignment. Displaced lesser trochanter again noted. Total fluoroscopy images: 5 Total fluoroscopy time: 148 seconds Total dose: Radiation Exposure Index (as provided by the fluoroscopic device): 20.4 mGy air Kerma Please see intraoperative findings for further detail. IMPRESSION: Intraoperative fluoroscopy for left hip cephalomedullary nail fixation. Electronically Signed   By: Neita Garnet M.D.   On: 05/26/2023 21:04   DG C-Arm 1-60 Min-No Report Result Date: 05/26/2023 Fluoroscopy was utilized by the requesting physician.  No radiographic interpretation.   DG C-Arm 1-60 Min-No Report Result Date: 05/26/2023 Fluoroscopy was utilized by the requesting physician.  No radiographic interpretation.        Scheduled Meds:  acetaminophen  650 mg Oral Q4H   apixaban  2.5 mg Oral BID   calcium-vitamin D  1 tablet Oral Q lunch   cholecalciferol  1,000 Units Oral Daily   ezetimibe  10 mg Oral Daily   folic acid  1 mg Oral Daily   iron polysaccharides  150 mg Oral Daily   metoprolol tartrate  12.5 mg Oral BID   polyethylene glycol  17 g Oral BID  scopolamine  1 patch Transdermal Once   senna-docusate  2 tablet Oral BID   sodium chloride flush  3 mL Intravenous Q12H   Continuous Infusions:  sodium chloride 100 mL/hr at 05/28/23 0857     LOS: 3 days    Time spent: 35 minutes    Kimmy Totten A Kimora Stankovic, MD [facility]   If 7PM-7AM, please contact night-coverage [email or web address]  05/28/2023, 2:49 PM

## 2023-05-28 NOTE — Op Note (Addendum)
 .Orthopaedic Surgery Operative Note (CSN: [id number])  Peggy Mendez  09/11/1926 Date of Surgery: 05/26/2023   Diagnoses:  Left Intertrochanteric Hip Fracture  Procedure: Left hip cephalomedullary nailing   Operative Finding Successful completion of the planned procedure.  Displaced left intertrochanteric hip fracture. Given the patient's native varus angle of her femoral neck a 125 degree nail was placed and only one cephalomedullary nail was placed. The patient was also noted to have very poor bone quality and fragmentation of her greater trochanter and medial calcar.  Post-operative plan: The patient will be PWB x25% for 2 weeks with progression to 50% for 2 weeks then WBAT.  The patient will be evaluated by PT and OT.  DVT prophylaxis- patient will be restated on her home Eliquis dose on POD#1.   Pain control with PRN pain medication preferring oral medicines.  Follow up plan will be scheduled in approximately 14 days for incision check and XR left hip.  Post-Op Diagnosis: Same Surgeons:Primary: Luci Bank, MD Assistants:None Location: [facility] OR ROOM 04 Anesthesia: General Antibiotics: Ancef 2 g Tourniquet time: N/A Estimated Blood Loss: 200 mL Complications: None Specimens: None Implants: Implant Name Type Inv. Item Serial No. Manufacturer Lot No. LRB No. Used Action  NAIL Ot[employer] [id number] - [id number] Nail NAIL [employer] [id number] Left 1 Implanted  SUBTROCH LAG SCREW    [employer] [id number] Left 1 Implanted  SCREW TRIGEN LOW PROF 5.0X32.5 - [id number] Screw SCREW TRIGEN LOW PROF 5.0X32.5  [employer] [id number] Left 1 Implanted    Indications for Surgery:   PENNY ARRAMBIDE is a 89 y.o. female with PMH HLD, HTN, A-fib on eliquis, AV block with pacemaker who had mechanical fall. who presented to the ED for evaluation after sustaining a mechanical fall. She was found to have a left  intertorchanteric hip fracture.  Benefits and risks of operative and nonoperative management were discussed prior to surgery with patient/guardian(s) and informed consent form was completed. These risks include but are not limited to infection, bleeding, damage to surrounding structures, malunion, nonunion, failure of hardware, prominent hardware, need for assistive devices, prolonged rehabilitation,  need for additional surgery, and cardiopulmonary complications from anesthesia   Procedure:   The patient was identified properly. Informed consent was obtained and the surgical site was marked. The patient was taken up to suite where general anesthesia was induced..The patient was placed supine on a fracture table and appropriate reduction was obtained and visualized on fluoroscopy prior to the beginning of the procedure. The left leg was prepped and draped in the usual sterile fashion. We made an incision proximal to the greater trochanter and dissected down through the fascia.  We then carefully placed our starting guidewire localizing under fluoroscopy prior to advancing the wire into the bone. There was noted to be significant comminution of the greater trochanter and poor bone quality. An entry reamer was used to open the femoral canal over the guidewire.  We selected a length of nail noted above.  At this point we placed our nail localizing under fluoroscopy that it was at the appropriate level prior to using the outrigger device to pass a wire and then the cephalo-medullary screw. Due to the native varus of her femoral neck, a single cephalomedullary screw was placed. The screw was locked proximally to avoid over collapse.  We took final shots at the proximal femur and then used the outrigger to place one distal interlock screw.  Final pictures were obtained.  The wounds were thoroughly irrigated closed in a multilayer fashion with absorable sutures and skin staples.  A sterile dressing was placed.   The patient was awoken from general anesthesia and taken to the PACU in stable condition without complication.

## 2023-05-28 NOTE — Evaluation (Signed)
 Occupational Therapy Evaluation Patient Details Name: Peggy Mendez MRN: [id number] DOB: September 30, 1926 Today's Date: 05/28/2023   History of Present Illness   89 y.o. female [facility] to [facility] 05/25/23 after falling while bending over at home. Admitted with L hip intertochanteric fx, s/p L hip cephalomedullary nail 2/21. PMHx: HTN, PAF, PPM (2008), chronic vertigo, osteoporosis     Clinical Impressions Pt reports ind at baseline with ADLs/functional mobility, was living alone. Pt currently needing up to max A for ADLs, mod-max A for bed mobility and for transfers with 2 person HHA. Pt with mild R lateral lean, attempting to offload L hip. Pt presenting with impairments listed below, will follow acutely. Patient will benefit from continued inpatient follow up therapy, <3 hours/day to maximize safety/ind with ADL/functional mobility.      If plan is discharge home, recommend the following:   Two people to help with walking and/or transfers;A lot of help with bathing/dressing/bathroom;Assistance with cooking/housework;Assist for transportation;Help with stairs or ramp for entrance     Functional Status Assessment   Patient has had a recent decline in their functional status and demonstrates the ability to make significant improvements in function in a reasonable and predictable amount of time.     Equipment Recommendations   Other (comment) (defer)     Recommendations for Other Services   PT consult     Precautions/Restrictions   Precautions Precautions: Fall Restrictions Weight Bearing Restrictions Per Provider Order: Yes LLE Weight Bearing Per Provider Order: Partial weight bearing LLE Partial Weight Bearing Percentage or Pounds: 25% x 2 weeks, then 50% x 2 weeks; then WBAT     Mobility Bed Mobility Overal bed mobility: Needs Assistance Bed Mobility: Supine to Sit     Supine to sit: Mod assist, Max assist, +2 for physical assistance     General bed mobility  comments: use of bed pad to scoot hips EOB    Transfers Overall transfer level: Needs assistance Equipment used: 2 person hand held assist Transfers: Sit to/from Stand, Bed to chair/wheelchair/BSC Sit to Stand: Mod assist, Max assist, +2 physical assistance Stand pivot transfers: Mod assist, Max assist, +2 physical assistance         General transfer comment: attempted initially to use RW, however pt unable to coordinate RW while pivoting RLE, opted for 2 person pivot transfer to chair      Balance Overall balance assessment: Needs assistance Sitting-balance support: Feet supported Sitting balance-Leahy Scale: Fair Sitting balance - Comments: mild R lat lean   Standing balance support: During functional activity, Reliant on assistive device for balance Standing balance-Leahy Scale: Poor Standing balance comment: reliant on external support                           ADL either performed or assessed with clinical judgement   ADL Overall ADL's : Needs assistance/impaired Eating/Feeding: Set up   Grooming: Set up;Sitting   Upper Body Bathing: Moderate assistance;Sitting   Lower Body Bathing: Maximal assistance;Bed level   Upper Body Dressing : Moderate assistance;Sitting   Lower Body Dressing: Maximal assistance;Bed level   Toilet Transfer: Moderate assistance;+2 for physical assistance;Maximal assistance   Toileting- Clothing Manipulation and Hygiene: Maximal assistance       Functional mobility during ADLs: Moderate assistance;Maximal assistance;+2 for physical assistance       Vision   Vision Assessment?: No apparent visual deficits     Perception Perception: Not tested       Praxis Praxis:  Not tested       Pertinent Vitals/Pain Pain Assessment Pain Assessment: Faces Pain Score: 4  Faces Pain Scale: Hurts even more Pain Location: LLE Pain Descriptors / Indicators: Discomfort Pain Intervention(s): Limited activity within patient's  tolerance, Monitored during session, Repositioned     Extremity/Trunk Assessment Upper Extremity Assessment Upper Extremity Assessment: Generalized weakness   Lower Extremity Assessment Lower Extremity Assessment: Defer to PT evaluation   Cervical / Trunk Assessment Cervical / Trunk Assessment: Kyphotic   Communication Communication Communication: No apparent difficulties   Cognition Arousal: Alert Behavior During Therapy: WFL for tasks assessed/performed Cognition: No apparent impairments                               Following commands: Intact       Cueing  General Comments   Cueing Techniques: Verbal cues      Exercises     Shoulder Instructions      Home Living Family/patient expects to be discharged to:: Private residence Living Arrangements: Alone Available Help at Discharge: Friend(s);Available PRN/intermittently;Family Type of Home: House Home Access: Stairs to enter [employer] of Steps: 2 Entrance Stairs-Rails: Left Home Layout: Two level;Bed/bath upstairs Alternate Level Stairs-Number of Steps: 12 Alternate Level Stairs-Rails: Left Bathroom Shower/Tub: Tub/shower unit   Bathroom Toilet: Handicapped height     Home Equipment: Grab bars - toilet;Grab bars - tub/shower;Cane - single Librarian, academic (2 wheels)          Prior Functioning/Environment Prior Level of Function : Independent/Modified Independent;History of Falls (last six months);Driving             Mobility Comments: ind/ w/ no AD in the home, cane in community. Uses RW at night ADLs Comments: ind    OT Problem List: Decreased strength;Decreased range of motion;Decreased activity tolerance;Impaired balance (sitting and/or standing);Decreased safety awareness;Decreased knowledge of use of DME or AE   OT Treatment/Interventions: Self-care/ADL training;Therapeutic exercise;Energy conservation;DME and/or AE instruction;Therapeutic activities;Balance  training;Patient/family education      OT Goals(Current goals can be found in the care plan section)   Acute Rehab OT Goals Patient Stated Goal: to get OOB OT Goal Formulation: With patient Time For Goal Achievement: 06/11/23 Potential to Achieve Goals: Good ADL Goals Pt Will Perform Upper Body Dressing: with supervision;sitting Pt Will Perform Lower Body Dressing: with min assist;sitting/lateral leans;sit to/from stand Pt Will Transfer to Toilet: with min assist;ambulating;regular height toilet Additional ADL Goal #1: pt will perform bed mobility with CGA in prep for ADLs   OT Frequency:  Min 1X/week    Co-evaluation PT/OT/SLP Co-Evaluation/Treatment: Yes Reason for Co-Treatment: For patient/therapist safety;To address functional/ADL transfers PT goals addressed during session: Balance;Mobility/safety with mobility;Proper use of DME OT goals addressed during session: ADL's and self-care;Strengthening/ROM      AM-PAC OT "6 Clicks" Daily Activity     Outcome Measure Help from another person eating meals?: A Little Help from another person taking care of personal grooming?: A Little Help from another person toileting, which includes using toliet, bedpan, or urinal?: A Lot Help from another person bathing (including washing, rinsing, drying)?: A Lot Help from another person to put on and taking off regular upper body clothing?: A Lot Help from another person to put on and taking off regular lower body clothing?: A Lot 6 Click Score: 14   End of Session Equipment Utilized During Treatment: Gait belt;Rolling walker (2 wheels) Nurse Communication: Mobility status  Activity Tolerance: Patient tolerated treatment  well Patient left: with call bell/phone within reach;in chair;with chair alarm set  OT Visit Diagnosis: Unsteadiness on feet (R26.81);Other abnormalities of gait and mobility (R26.89);Muscle weakness (generalized) (M62.81)                Time: 1610-9604 OT Time  Calculation (min): 29 min Charges:  OT General Charges $OT Visit: 1 Visit OT Evaluation $OT Eval Moderate Complexity: 1 Mod  Shaquil Aldana K, OTD, OTR/L SecureChat Preferred Acute Rehab [phone]   Carver Fila Koonce 05/28/2023, 12:11 PM

## 2023-05-28 NOTE — Progress Notes (Addendum)
.  Subjective: 2 Days Post-Op Procedure(s) (LRB): LEFT HIP CEPHALOMEDULLARY NAIL (Left)  Patient seen and examined. Able to work with PT this AM. No pain at rest   Activity level:  PWB x25% Diet tolerance:  as tolerated Patient reports pain as moderate.    Objective: Vital signs in last 24 hours: Temp:  [98 F (36.7 C)-98.6 F (37 C)] 98.2 F (36.8 C) (02/23 0741) Pulse Rate:  [80-92] 83 (02/23 0741) Resp:  [16-18] 18 (02/23 0741) BP: (117-139)/(62-94) 139/70 (02/23 0741) SpO2:  [96 %-100 %] 96 % (02/23 0741)  Labs: Recent Labs    05/25/23 1715 05/26/23 0450 05/27/23 0427 05/28/23 0611  HGB 12.8 10.9* 9.6* 8.5*   Recent Labs    05/27/23 0427 05/28/23 0611  WBC 11.5* 9.7  RBC 3.33* 2.96*  HCT 28.6* 25.9*  PLT 157 147*   Recent Labs    05/27/23 0427 05/28/23 0611  NA 136 136  K 4.4 3.9  CL 103 107  CO2 25 24  BUN 14 11  CREATININE 0.73 0.72  GLUCOSE 130* 106*  CALCIUM 8.8* 8.3*   Recent Labs    05/25/23 1715 05/26/23 0450  INR 1.1 1.1    Physical Exam: Neurologically intact Neurovascular intact Sensation intact distally Intact pulses distally Dorsiflexion/Plantar flexion intact Incision: dressing C/D/I Compartment soft  Assessment/Plan:  2 Days Post-Op Procedure(s) (LRB): LEFT HIP CEPHALOMEDULLARY NAIL (Left) PWB 25% for 2 weeks then can progress to WBAT PT/OT DVT ppx: Home Eliquis restarted Ancef q8 for 24 hours for surgical ppx Pain control as needed Hgb 8.5 from 9.6. Due to acute blood loss anemia from surgery and fracture. Continue to trend CBC and transfuse as needed   Luci Bank 05/28/2023, 10:15 AM

## 2023-05-28 NOTE — Plan of Care (Signed)

## 2023-05-28 NOTE — Progress Notes (Signed)
 PIV occluded. Pt asked not to have it replaced. Dr. Sunnie Nielsen made aware. OK by Provider to leave out.

## 2023-05-28 NOTE — Evaluation (Addendum)
 Physical Therapy Evaluation Patient Details Name: Peggy Mendez MRN: [id number] DOB: 27-Mar-1927 Today's Date: 05/28/2023  History of Present Illness  89 y.o. female presents to [facility] 05/25/23 after falling while bending over at home. Admitted with L hip intertochanteric fx, s/p L hip cephalomedullary nail 2/21. PMHx: HTN, PAF, PPM (2008), chronic vertigo, osteoporosis   Clinical Impression  Pt in bed upon arrival and agreeable to PT eval. PTA, pt was independent with no AD in the home with use of SP cane in the community. In today's session, pt required ModA/MaxAx2 for bed mobility and for stand pivot transfer via HHA. Pt needed physical assist to keep LLE <25% WB during transfer to the right. Pt also leans towards the right in sitting due to pain in LLE. Pt currently with functional limitations due to the deficits listed below (see PT Problem List). Pt would benefit from acute skilled PT to address functional impairments. Recommending post-acute rehab <3hrs to work towards independence with mobility. Acute PT to follow.         If plan is discharge home, recommend the following: A lot of help with walking and/or transfers;A lot of help with bathing/dressing/bathroom;Assist for transportation;Help with stairs or ramp for entrance;Assistance with cooking/housework   Can travel by private vehicle   No    Equipment Recommendations BSC/3in1;Wheelchair (measurements PT);Wheelchair cushion (measurements PT)     Functional Status Assessment Patient has had a recent decline in their functional status and demonstrates the ability to make significant improvements in function in a reasonable and predictable amount of time.     Precautions / Restrictions Precautions Precautions: Fall Restrictions Weight Bearing Restrictions Per Provider Order: Yes LLE Weight Bearing Per Provider Order: Partial weight bearing LLE Partial Weight Bearing Percentage or Pounds: 25% x 2 weeks, then 50% x 2 weeks; then  WBAT      Mobility  Bed Mobility Overal bed mobility: Needs Assistance Bed Mobility: Supine to Sit    Supine to sit: Mod assist, Max assist, +2 for physical assistance    General bed mobility comments: use of bed pad to scoot hips EOB, assist to raise trunk    Transfers Overall transfer level: Needs assistance Equipment used: 2 person hand held assist, Rolling walker (2 wheels) Transfers: Sit to/from Stand, Bed to chair/wheelchair/BSC Sit to Stand: Mod assist, Max assist, +2 physical assistance Stand pivot transfers: Mod assist, Max assist, +2 physical assistance      General transfer comment: attempted initially to use RW, however pt unable to coordinate RW while pivoting on RLE, opted for 2 person pivot transfer to chair to the R. Therapist support to prevent >25% WB on LLE    Ambulation/Gait    General Gait Details: unable    Balance Overall balance assessment: Needs assistance Sitting-balance support: Feet supported Sitting balance-Leahy Scale: Fair Sitting balance - Comments: mild R lat lean   Standing balance support: During functional activity, Reliant on assistive device for balance Standing balance-Leahy Scale: Poor Standing balance comment: reliant on external support       Pertinent Vitals/Pain Pain Assessment Pain Assessment: Faces Faces Pain Scale: Hurts even more Pain Location: LLE Pain Descriptors / Indicators: Discomfort Pain Intervention(s): Limited activity within patient's tolerance, Monitored during session, Repositioned    Home Living Family/patient expects to be discharged to:: Private residence Living Arrangements: Alone Available Help at Discharge: Friend(s);Available PRN/intermittently;Family Type of Home: House Home Access: Stairs to enter Entrance Stairs-Rails: Left Entrance Stairs-Number of Steps: 2 Alternate Level Stairs-Number of Steps: 12 Home Layout:  Two level;Bed/bath upstairs Home Equipment: Grab bars - toilet;Grab bars -  tub/shower;Cane - single Librarian, academic (2 wheels)      Prior Function Prior Level of Function : Independent/Modified Independent;History of Falls (last six months);Driving  Mobility Comments: ind w/ no AD in the home, cane in community. Uses RW at night ADLs Comments: ind     Extremity/Trunk Assessment   Upper Extremity Assessment Upper Extremity Assessment: Defer to OT evaluation    Lower Extremity Assessment Lower Extremity Assessment: LLE deficits/detail LLE Deficits / Details: tibial external rotation at rest, unable to achieve PROM to neutral 2/2 pain. Reports IR at baseline prior to fall. Limited knee flexion/ext 2/2 pain LLE: Unable to fully assess due to pain LLE Sensation: WNL    Cervical / Trunk Assessment Cervical / Trunk Assessment: Kyphotic  Communication   Communication Communication: No apparent difficulties    Cognition Arousal: Alert Behavior During Therapy: WFL for tasks assessed/performed   PT - Cognitive impairments: No apparent impairments  Following commands: Intact       Cueing Cueing Techniques: Verbal cues     General Comments General comments (skin integrity, edema, etc.): VSS on RA, dressing dry and intact. Redish discoloration B shins     PT Assessment Patient needs continued PT services  PT Problem List Decreased strength;Decreased range of motion;Decreased activity tolerance;Decreased balance;Decreased mobility;Decreased knowledge of use of DME;Decreased knowledge of precautions;Decreased coordination       PT Treatment Interventions DME instruction;Gait training;Functional mobility training;Therapeutic activities;Therapeutic exercise;Balance training;Neuromuscular re-education;Patient/family education;Wheelchair mobility training    PT Goals (Current goals can be found in the Care Plan section)  Acute Rehab PT Goals Patient Stated Goal: to get better PT Goal Formulation: With patient Time For Goal Achievement:  06/11/23 Potential to Achieve Goals: Good    Frequency Min 1X/week     Co-evaluation PT/OT/SLP Co-Evaluation/Treatment: Yes Reason for Co-Treatment: For patient/therapist safety;To address functional/ADL transfers PT goals addressed during session: Balance;Mobility/safety with mobility;Proper use of DME OT goals addressed during session: ADL's and self-care;Strengthening/ROM       AM-PAC PT "6 Clicks" Mobility  Outcome Measure Help needed turning from your back to your side while in a flat bed without using bedrails?: Total Help needed moving from lying on your back to sitting on the side of a flat bed without using bedrails?: Total Help needed moving to and from a bed to a chair (including a wheelchair)?: Total Help needed standing up from a chair using your arms (e.g., wheelchair or bedside chair)?: Total Help needed to walk in hospital room?: Total Help needed climbing 3-5 steps with a railing? : Total 6 Click Score: 6    End of Session Equipment Utilized During Treatment: Gait belt Activity Tolerance: Patient limited by pain Patient left: in chair;with call bell/phone within reach;with chair alarm set Nurse Communication: Mobility status;Precautions;Weight bearing status PT Visit Diagnosis: Other abnormalities of gait and mobility (R26.89);Muscle weakness (generalized) (M62.81);History of falling (Z91.81);Unsteadiness on feet (R26.81)    Time: 1005-1035 PT Time Calculation (min) (ACUTE ONLY): 30 min   Charges:   PT Evaluation $PT Eval Low Complexity: 1 Low   PT General Charges $$ ACUTE PT VISIT: 1 Visit       Hilton Cork, PT, DPT Secure Chat Preferred  Rehab Office [phone]   Arturo Morton Brion Aliment 05/28/2023, 12:41 PM

## 2023-05-28 NOTE — Plan of Care (Signed)
  Problem: Education: Goal: Knowledge of General Education information will improve Description: Including pain rating scale, medication(s)/side effects and non-pharmacologic comfort measures 05/28/2023 2240 by Dahlia Bailiff, RN Outcome: Progressing 05/28/2023 2239 by Dahlia Bailiff, RN Outcome: Progressing   Problem: Health Behavior/Discharge Planning: Goal: Ability to manage health-related needs will improve 05/28/2023 2240 by Dahlia Bailiff, RN Outcome: Progressing 05/28/2023 2239 by Dahlia Bailiff, RN Outcome: Progressing   Problem: Clinical Measurements: Goal: Ability to maintain clinical measurements within normal limits will improve 05/28/2023 2240 by Dahlia Bailiff, RN Outcome: Progressing 05/28/2023 2239 by Dahlia Bailiff, RN Outcome: Progressing

## 2023-05-29 DIAGNOSIS — S72142A Displaced intertrochanteric fracture of left femur, initial encounter for closed fracture: Secondary | ICD-10-CM | POA: Diagnosis not present

## 2023-05-29 LAB — BPAM RBC
Blood Product Expiration Date: 202503212359
Blood Product Expiration Date: 202503212359
Blood Product Expiration Date: 202503212359
Unit Type and Rh: 6200
Unit Type and Rh: 6200
Unit Type and Rh: 6200

## 2023-05-29 LAB — TYPE AND SCREEN
ABO/RH(D): A POS
Antibody Screen: POSITIVE
Unit division: 0
Unit division: 0
Unit division: 0

## 2023-05-29 LAB — CBC
HCT: 25.5 % — ABNORMAL LOW (ref 36.0–46.0)
Hemoglobin: 8.4 g/dL — ABNORMAL LOW (ref 12.0–15.0)
MCH: 28.6 pg (ref 26.0–34.0)
MCHC: 32.9 g/dL (ref 30.0–36.0)
MCV: 86.7 fL (ref 80.0–100.0)
Platelets: 161 10*3/uL (ref 150–400)
RBC: 2.94 MIL/uL — ABNORMAL LOW (ref 3.87–5.11)
RDW: 14.6 % (ref 11.5–15.5)
WBC: 9.4 10*3/uL (ref 4.0–10.5)
nRBC: 0 % (ref 0.0–0.2)

## 2023-05-29 MED ORDER — SENNOSIDES-DOCUSATE SODIUM 8.6-50 MG PO TABS: 2.0000 | ORAL_TABLET | Freq: Two times a day (BID) | ORAL | 0 refills | Status: DC

## 2023-05-29 MED ORDER — POLYSACCHARIDE IRON COMPLEX 150 MG PO CAPS: 150.0000 mg | ORAL_CAPSULE | Freq: Every day | ORAL | 0 refills | Status: DC

## 2023-05-29 MED ORDER — FOLIC ACID 1 MG PO TABS: 1.0000 mg | ORAL_TABLET | Freq: Every day | ORAL | 0 refills | Status: DC

## 2023-05-29 MED ORDER — ACETAMINOPHEN 325 MG PO TABS
650.0000 mg | ORAL_TABLET | Freq: Four times a day (QID) | ORAL | 0 refills | Status: AC | PRN
Start: 2023-05-29 — End: ?

## 2023-05-29 MED ORDER — OXYCODONE HCL 5 MG PO TABS: 5.0000 mg | ORAL_TABLET | Freq: Four times a day (QID) | ORAL | 0 refills | Status: AC | PRN

## 2023-05-29 MED ORDER — POLYETHYLENE GLYCOL 3350 17 G PO PACK
17.0000 g | PACK | Freq: Two times a day (BID) | ORAL | 0 refills | Status: AC
Start: 2023-05-29 — End: ?

## 2023-05-29 NOTE — Discharge Summary (Signed)
 Physician Discharge Summary   Patient: Peggy Mendez MRN: [id number] DOB: 01/25/1927  Admit date:     05/25/2023  Discharge date: 05/29/23  Discharge Physician: Alba Cory   PCP: Wanda Plump, MD   Recommendations at discharge:   Needs to follow up with Ortho in 1 week PWB 25% for 2 weeks then can progress to WBAT  Monitor Hb   Discharge Diagnoses: Principal Problem:   Femoral fracture (HCC) Active Problems:   Essential hypertension   PACEMAKER-[employer]   Laceration of arm   Leukocytosis   Osteoporosis  Resolved Problems:   * No resolved hospital problems. *  Hospital Course: 89 year old with past medical history significant for A-fib, heart block status post pacemaker, chronic anticoagulation, lives independently, has a history of repeated falls, fell the day of admission, lost her balance fell down and twisted her left lower extremity.  Evaluation in the ED she was found to have left femoral fracture. She has a history of vertigo prone to motion sickness.  She received Dilaudid in the ER for pain.    Assessment and Plan: 1-Left Femoral Fracture: Presented after a fall, she has a history of recurrent falls. Pain management Ortho Consulted, underwent left cephalomedullary nail on 2/21 by Dr. Thornell Mule PRN Dilaudid. Partial weightbearing 25% for 2 weeks then can progress to WBAT Needs rehab Monitor hb Stable for discharge  2-Osteoporosis: Normal Vitamin D. Follow up Out patient On calcium supplements.    Leukocytosis: UA negative  for infection Normalized   Laceration of arm: Local care   Chronic A-fib, heart block status post pacemaker On Metoprolol Eliquis was resume.    Essential hypertension On metoprolol.   Vertigo; Balance issues.  B 12; 639.   Acute Blood loss anemia; -post sx expected.of no clinical significance. -hb down to 8.5 Started  iron supplement, and folic acid Hb stable.   Acute Delirium, metabolic  encephalopathy Post sx, in setting hospitalization. Grandniece noticed that patient was confused yesterday Delirium precaution  Estimated body mass index is 20.37 kg/m as calculated from the following:   Height as of this encounter: 5\' 3"  (1.6 m).   Weight as of this encounter: 52.2 kg.        Consultants: Dr Thornell Mule  Procedures performed: Ortho Consulted, underwent left cephalomedullary nail on 2/21 by Dr. Thornell Mule Disposition: Skilled nursing facility Diet recommendation:  Discharge Diet Orders (From admission, onward)     Start     Ordered   05/29/23 0000  Diet - low sodium heart healthy        05/29/23 0955           Cardiac diet DISCHARGE MEDICATION: Allergies as of 05/29/2023       Reactions   Lisinopril Cough   Anesthetics, Ester Nausea And Vomiting   All Anesthetics   Other Dermatitis, Rash   Tegaderm (clear occlusive dressing) Pt was miserable        Medication List     TAKE these medications    acetaminophen 325 MG tablet Commonly known as: TYLENOL Take 2 tablets (650 mg total) by mouth every 6 (six) hours as needed for mild pain (pain score 1-3) (or Fever >/= 101).   CALCIUM 600/VITAMIN D PO Take 1 capsule by mouth daily with lunch.   cholecalciferol 1000 units tablet Commonly known as: VITAMIN D Take 1,000 Units by mouth daily.   Eliquis 2.5 MG Tabs tablet Generic drug: apixaban TAKE 1 TABLET BY MOUTH TWICE  DAILY  ezetimibe 10 MG tablet Commonly known as: ZETIA TAKE 1 TABLET BY MOUTH DAILY   Fish Oil 1200 MG Caps Take 1,200 mg by mouth daily as needed.   folic acid 1 MG tablet Commonly known as: FOLVITE Take 1 tablet (1 mg total) by mouth daily.   iron polysaccharides 150 MG capsule Commonly known as: NIFEREX Take 1 capsule (150 mg total) by mouth daily.   losartan 50 MG tablet Commonly known as: COZAAR TAKE 1 TABLET BY MOUTH DAILY   metoprolol tartrate 25 MG tablet Commonly known as: LOPRESSOR TAKE ONE-HALF  TABLET BY MOUTH  TWICE DAILY   multivitamin per tablet Take 1 tablet by mouth daily.   oxyCODONE 5 MG immediate release tablet Commonly known as: Oxy IR/ROXICODONE Take 1 tablet (5 mg total) by mouth every 6 (six) hours as needed for up to 3 days for moderate pain (pain score 4-6).   polyethylene glycol 17 g packet Commonly known as: MIRALAX / GLYCOLAX Take 17 g by mouth 2 (two) times daily.   senna-docusate 8.6-50 MG tablet Commonly known as: Senokot-S Take 2 tablets by mouth 2 (two) times daily.   Tart Cherry Advanced Caps Take 1 capsule by mouth at bedtime.        Follow-up Information     Wanda Plump, MD Follow up.   Specialty: Internal Medicine Contact information: [address] [place] [address] [phone]                Discharge Exam: Ceasar Mons Weights   05/25/23 1638  Weight: 52.2 kg   General; NAD  Condition at discharge: stable  The results of significant diagnostics from this hospitalization (including imaging, microbiology, ancillary and laboratory) are listed below for reference.   Imaging Studies: DG FEMUR MIN 2 VIEWS LEFT Result Date: 05/26/2023 CLINICAL DATA:  Elective surgery.  Left hip cephalomedullary nail. EXAM: LEFT FEMUR 2 VIEWS COMPARISON:  Left femur radiographs 05/25/2023 FINDINGS: Images were performed intraoperatively without the presence of a radiologist. The patient is undergoing cephalomedullary nail fixation of the previously seen intertrochanteric fracture. Improved alignment. Displaced lesser trochanter again noted. Total fluoroscopy images: 5 Total fluoroscopy time: 148 seconds Total dose: Radiation Exposure Index (as provided by the fluoroscopic device): 20.4 mGy air Kerma Please see intraoperative findings for further detail. IMPRESSION: Intraoperative fluoroscopy for left hip cephalomedullary nail fixation. Electronically Signed   By: Neita Garnet M.D.   On: 05/26/2023 21:04   DG C-Arm 1-60 Min-No  Report Result Date: 05/26/2023 Fluoroscopy was utilized by the requesting physician.  No radiographic interpretation.   DG C-Arm 1-60 Min-No Report Result Date: 05/26/2023 Fluoroscopy was utilized by the requesting physician.  No radiographic interpretation.   CT Head Wo Contrast Result Date: 05/25/2023 CLINICAL DATA:  Fall, on anticoagulation. EXAM: CT HEAD WITHOUT CONTRAST TECHNIQUE: Contiguous axial images were obtained from the base of the skull through the vertex without intravenous contrast. RADIATION DOSE REDUCTION: This exam was performed according to the departmental dose-optimization program which includes automated exposure control, adjustment of the mA and/or kV according to patient size and/or use of iterative reconstruction technique. COMPARISON:  Head CT 09/11/2021 FINDINGS: Brain: No intracranial hemorrhage, mass effect, or midline shift. Stable degree of atrophy and chronic small vessel ischemia. No hydrocephalus. The basilar cisterns are patent. No evidence of territorial infarct or acute ischemia. No extra-axial or intracranial fluid collection. Vascular: Atherosclerosis of skullbase vasculature without hyperdense vessel or abnormal calcification. Skull: No fracture or focal lesion. Sinuses/Orbits: No acute  finding. Other: None. IMPRESSION: 1. No acute intracranial abnormality. No skull fracture. 2. Stable atrophy and chronic small vessel ischemia. Electronically Signed   By: Narda Rutherford M.D.   On: 05/25/2023 17:49   DG Humerus Right Result Date: 05/25/2023 CLINICAL DATA:  Fall with abrasion over right forearm. EXAM: RIGHT HUMERUS - 2+ VIEW COMPARISON:  None Available. FINDINGS: There is no evidence of fracture or other focal bone lesions. Cortical margins of the humerus are intact. Chronic change about the elbow. Chronic change about the glenohumeral joint. Soft tissues are unremarkable. IMPRESSION: No fracture of the humerus. Electronically Signed   By: Narda Rutherford M.D.   On:  05/25/2023 17:32   DG HIP UNILAT W OR W/O PELVIS 2-3 VIEWS LEFT Result Date: 05/25/2023 CLINICAL DATA:  Pain after fall. EXAM: DG HIP (WITH OR WITHOUT PELVIS) 2-3V LEFT; LEFT FEMUR 2 VIEWS COMPARISON:  None Available. FINDINGS: Pelvis and hip: Comminuted displaced intertrochanteric left proximal femur fracture. There is mild proximal migration of the femoral shaft. No hip dislocation. No additional fracture of the pelvis. Pubic rami are intact. Pubic symphysis and sacroiliac joints are congruent. Moderate right hip osteoarthritis with joint space narrowing and spurring. Femur: Proximal femur fracture as described above. The distal femur is intact. The bones are subjectively under mineralized. Knee osteoarthritis, no knee dislocation. Vascular calcifications are seen. IMPRESSION: Comminuted displaced intertrochanteric left proximal femur fracture. Electronically Signed   By: Narda Rutherford M.D.   On: 05/25/2023 17:31   DG Femur Min 2 Views Left Result Date: 05/25/2023 CLINICAL DATA:  Pain after fall. EXAM: DG HIP (WITH OR WITHOUT PELVIS) 2-3V LEFT; LEFT FEMUR 2 VIEWS COMPARISON:  None Available. FINDINGS: Pelvis and hip: Comminuted displaced intertrochanteric left proximal femur fracture. There is mild proximal migration of the femoral shaft. No hip dislocation. No additional fracture of the pelvis. Pubic rami are intact. Pubic symphysis and sacroiliac joints are congruent. Moderate right hip osteoarthritis with joint space narrowing and spurring. Femur: Proximal femur fracture as described above. The distal femur is intact. The bones are subjectively under mineralized. Knee osteoarthritis, no knee dislocation. Vascular calcifications are seen. IMPRESSION: Comminuted displaced intertrochanteric left proximal femur fracture. Electronically Signed   By: Narda Rutherford M.D.   On: 05/25/2023 17:31   DG Chest 1 View Result Date: 05/25/2023 CLINICAL DATA:  Pain after fall. EXAM: CHEST  1 VIEW COMPARISON:  None  Available. FINDINGS: Dual lead left-sided pacemaker in place. The cardiomediastinal contours are normal. The lungs are clear. Pulmonary vasculature is normal. No consolidation, pleural effusion, or pneumothorax. No acute osseous abnormalities are seen. IMPRESSION: No acute findings or evidence of traumatic injury. Electronically Signed   By: Narda Rutherford M.D.   On: 05/25/2023 17:29   CUP PACEART REMOTE DEVICE CHECK Result Date: 05/19/2023 Scheduled remote reviewed. Normal device function.  14 VHR episodes, longest 15 sec, Marker Channels and AEGM only available for review, c/w AF with RVR, on Eliquis and Metoprolol. Next remote 91 days. MC, CVRS   Microbiology: Results for orders placed or performed during the hospital encounter of 05/25/23  Surgical pcr screen     Status: None   Collection Time: 05/26/23  6:32 AM   Specimen: Nasal Mucosa; Nasal Swab  Result Value Ref Range Status   MRSA, PCR NEGATIVE NEGATIVE Final   Staphylococcus aureus NEGATIVE NEGATIVE Final    Comment: (NOTE) The Xpert SA Assay (FDA approved for NASAL specimens in patients 74 years of age and older), is one component of a comprehensive surveillance  program. It is not intended to diagnose infection nor to guide or monitor treatment. Performed at [facility], [address], [place], [place] [address]     Labs: CBC: Recent Labs  Lab 05/25/23 1715 05/26/23 0450 05/27/23 0427 05/28/23 0611 05/29/23 0628  WBC 10.9* 13.8* 11.5* 9.7 9.4  HGB 12.8 10.9* 9.6* 8.5* 8.4*  HCT 39.3 32.5* 28.6* 25.9* 25.5*  MCV 87.9 84.9 85.9 87.5 86.7  PLT 195 183 157 147* 161   Basic Metabolic Panel: Recent Labs  Lab 05/25/23 1715 05/26/23 0450 05/27/23 0427 05/28/23 0611  NA 139 137 136 136  K 3.7 4.4 4.4 3.9  CL 102 102 103 107  CO2 24 23 25 24   GLUCOSE 150* 113* 130* 106*  BUN 19 14 14 11   CREATININE 0.77 0.60 0.73 0.72  CALCIUM 9.1 9.2 8.8* 8.3*  PHOS  --  4.3  --   --    Liver Function Tests: No  results for input(s): "AST", "ALT", "ALKPHOS", "BILITOT", "PROT", "ALBUMIN" in the last 168 hours. CBG: No results for input(s): "GLUCAP" in the last 168 hours.  Discharge time spent: greater than 30 minutes.  Signed: Alba Cory, MD [facility] 05/29/2023

## 2023-05-29 NOTE — TOC Initial Note (Signed)
 Transition of Care [facility]) - Initial/Assessment Note    Patient Details  Name: Peggy Mendez MRN: [id number] Date of Birth: 1926-06-23  Transition of Care [facility]) CM/SW Contact:    Lorri Frederick, LCSW Phone Number: 05/29/2023, 4:02 PM  Clinical Narrative:     CSW met with pt regarding PT recommendation for SNF.    Pt is agreeable to this, medicare choice document provided, permission given to send out referral in hub.  Permission given to speak with nephew Chip, niece Pam.  Pt from home alone, no current services.       CSW spoke with niece Elita Quick, updated her-she is in [place] but will be in [place] tomorrow.      Expected Discharge Plan: Skilled Nursing Facility Barriers to Discharge: Continued Medical Work up, SNF Pending bed offer   Patient Goals and CMS Choice Patient states their goals for this hospitalization and ongoing recovery are:: "I'm not sure yet" CMS Medicare.gov Compare Post Acute Care list provided to:: Patient Choice offered to / list presented to : Patient      Expected Discharge Plan and Services In-house Referral: Clinical Social Work   Post Acute Care Choice: Skilled Nursing Facility Living arrangements for the past 2 months: Single Family Home Expected Discharge Date: 05/29/23                                    Prior Living Arrangements/Services Living arrangements for the past 2 months: Single Family Home Lives with:: Self Patient language and need for interpreter reviewed:: Yes Do you feel safe going back to the place where you live?: Yes      Need for Family Participation in Patient Care: Yes (Comment) Care giver support system in place?: Yes (comment) Current home services: Other (comment) (none) Criminal Activity/Legal Involvement Pertinent to Current Situation/Hospitalization: No - Comment as needed  Activities of Daily Living   ADL Screening (condition at time of admission) Independently performs ADLs?: No Does the  patient have a NEW difficulty with bathing/dressing/toileting/self-feeding that is expected to last >3 days?: Yes (Initiates electronic notice to provider for possible OT consult) (left hip fracture) Does the patient have a NEW difficulty with getting in/out of bed, walking, or climbing stairs that is expected to last >3 days?: Yes (Initiates electronic notice to provider for possible PT consult) (left hip fracture) Does the patient have a NEW difficulty with communication that is expected to last >3 days?: No Is the patient deaf or have difficulty hearing?: No Does the patient have difficulty seeing, even when wearing glasses/contacts?: No Does the patient have difficulty concentrating, remembering, or making decisions?: No  Permission Sought/Granted Permission sought to share information with : Family Supports Permission granted to share information with : Yes, Verbal Permission Granted  Share Information with NAME: niece Pam, nephew Chip  Permission granted to share info w AGENCY: SNF        Emotional Assessment Appearance:: Appears stated age Attitude/Demeanor/Rapport: Engaged Affect (typically observed): Appropriate, Pleasant Orientation: : Oriented to Self, Oriented to Place, Oriented to  Time, Oriented to Situation      Admission diagnosis:  Femoral fracture (HCC) [S72.90XA] Closed intertrochanteric fracture of hip, left, initial encounter [facility]) [S72.142A] Patient Active Problem List   Diagnosis Date Noted   Laceration of arm 05/25/2023   Leukocytosis 05/25/2023   Femoral fracture (HCC) 05/25/2023   Osteoporosis 05/25/2023   Degenerative arthritis of knee, bilateral 03/17/2016  PCP NOTES >>>> 01/24/2015   Angiodysplasia of colon 11/27/2012   History of colonic polyps 11/12/2012   Rosacea 08/24/2011   AV block, 2nd degree 01/21/2011   Annual physical exam >>>>>>>>>>>>>>>>>>> 07/30/2010   PAROXYSMAL ATRIAL FIBRILLATION 04/22/2010   Hypercholesterolemia 10/03/2006    Essential hypertension 10/03/2006   Disorder of bone and cartilage 10/03/2006   Abnormal mammogram 10/03/2006   Personal history presenting hazards to health 10/03/2006   PACEMAKER-Medtronic 06/03/2006   PCP:  Wanda Plump, MD Pharmacy:   [phone] - [address], [place] - [address] [place] [address] Phone: [phone] Fax: [phone]  [facility] - [place], [place] - [phone] [address] [place] [address] Phone: [phone] Fax: [phone]     Social Drivers of Health (SDOH) Social History: SDOH Screenings   Food Insecurity: No Food Insecurity (05/25/2023)  Housing: Low Risk  (05/25/2023)  Transportation Needs: No Transportation Needs (05/25/2023)  Utilities: Not At Risk (05/25/2023)  Alcohol Screen: Low Risk  (02/16/2018)  Depression (PHQ2-9): Low Risk  (04/07/2023)  Social Connections: Socially Isolated (05/25/2023)  Tobacco Use: Medium Risk (05/26/2023)   SDOH Interventions:     Readmission Risk Interventions     No data to display

## 2023-05-29 NOTE — Care Management Important Message (Signed)
 Important Message  Patient Details  Name: Peggy Mendez MRN: [id number] Date of Birth: 1926/07/26   Important Message Given:  Yes - Medicare IM     Sherilyn Banker 05/29/2023, 3:42 PM

## 2023-05-29 NOTE — Progress Notes (Signed)
.  Subjective: 3 Days Post-Op Procedure(s) (LRB): LEFT HIP CEPHALOMEDULLARY NAIL (Left)  Patient seen and examined. Pain slowly improving  Activity level:  PWB x25% Diet tolerance:  as tolerated Patient reports pain as moderate.    Objective: Vital signs in last 24 hours: Temp:  [97.6 F (36.4 C)-98.4 F (36.9 C)] 98.4 F (36.9 C) (02/24 0733) Pulse Rate:  [87-107] 87 (02/24 0733) Resp:  [18-20] 18 (02/24 0555) BP: (127-153)/(53-75) 138/66 (02/24 0733) SpO2:  [97 %] 97 % (02/24 0733)  Labs: Recent Labs    05/27/23 0427 05/28/23 0611 05/29/23 0628  HGB 9.6* 8.5* 8.4*   Recent Labs    05/28/23 0611 05/29/23 0628  WBC 9.7 9.4  RBC 2.96* 2.94*  HCT 25.9* 25.5*  PLT 147* 161   Recent Labs    05/27/23 0427 05/28/23 0611  NA 136 136  K 4.4 3.9  CL 103 107  CO2 25 24  BUN 14 11  CREATININE 0.73 0.72  GLUCOSE 130* 106*  CALCIUM 8.8* 8.3*   No results for input(s): "LABPT", "INR" in the last 72 hours.  Physical Exam: Neurologically intact Neurovascular intact Sensation intact distally Intact pulses distally Dorsiflexion/Plantar flexion intact Incision: dressing C/D/I Compartment soft  Assessment/Plan:  3 Days Post-Op Procedure(s) (LRB): LEFT HIP CEPHALOMEDULLARY NAIL (Left) PWB 25% for 2 weeks then can progress to WBAT PT/OT DVT ppx: Home Eliquis restarted Pain control as needed Hgb 8.4 from 8.5. Due to acute blood loss anemia from surgery and fracture. Continue to trend CBC and transfuse as needed DC pending SNF    Luci Bank 05/29/2023, 12:10 PM

## 2023-05-30 ENCOUNTER — Other Ambulatory Visit (HOSPITAL_COMMUNITY): Payer: Self-pay

## 2023-05-30 ENCOUNTER — Encounter (HOSPITAL_COMMUNITY): Payer: Self-pay | Admitting: Orthopedic Surgery

## 2023-05-30 DIAGNOSIS — S72142A Displaced intertrochanteric fracture of left femur, initial encounter for closed fracture: Secondary | ICD-10-CM | POA: Diagnosis not present

## 2023-05-30 MED ORDER — APIXABAN 2.5 MG PO TABS: 2.5000 mg | ORAL_TABLET | Freq: Two times a day (BID) | ORAL | 0 refills | Status: DC

## 2023-05-30 MED ORDER — APIXABAN 2.5 MG PO TABS
2.5000 mg | ORAL_TABLET | Freq: Two times a day (BID) | ORAL | 0 refills | Status: DC
  Filled 2023-05-30: qty 60, 30d supply, fill #0

## 2023-05-30 MED ORDER — MECLIZINE HCL 25 MG PO TABS
25.0000 mg | ORAL_TABLET | Freq: Once | ORAL | Status: AC
  Administered 2023-05-30: 25 mg via ORAL
  Filled 2023-05-30: qty 1

## 2023-05-30 NOTE — TOC Progression Note (Addendum)
 Transition of Care [facility]) - Progression Note    Patient Details  Name: Peggy Mendez MRN: [id number] Date of Birth: 18-Apr-1926  Transition of Care [facility]) CM/SW Contact  Lorri Frederick, LCSW Phone Number: 05/30/2023, 9:40 AM  Clinical Narrative:   Bed offers provided to pt, she will review.  60: CSW spoke with niece Pam, also provided her bed offers, she will speak with pt.   1010: TC Pam, they want to accept offer at [facility].    Auth submitted in [facility] and approved: [id number], 3 days: 2/25-2/27.  MD informed.   CSW confirmed with [facility] that they can receive pt today.   Expected Discharge Plan: Skilled Nursing Facility Barriers to Discharge: Continued Medical Work up, SNF Pending bed offer  Expected Discharge Plan and Services In-house Referral: Clinical Social Work   Post Acute Care Choice: Skilled Nursing Facility Living arrangements for the past 2 months: Single Family Home Expected Discharge Date: 05/29/23                                     Social Determinants of Health (SDOH) Interventions SDOH Screenings   Food Insecurity: No Food Insecurity (05/25/2023)  Housing: Low Risk  (05/25/2023)  Transportation Needs: No Transportation Needs (05/25/2023)  Utilities: Not At Risk (05/25/2023)  Alcohol Screen: Low Risk  (02/16/2018)  Depression (PHQ2-9): Low Risk  (04/07/2023)  Social Connections: Socially Isolated (05/25/2023)  Tobacco Use: Medium Risk (05/26/2023)    Readmission Risk Interventions     No data to display

## 2023-05-30 NOTE — Discharge Summary (Signed)
 Physician Discharge Summary   Patient: Peggy Mendez MRN: [id number] DOB: 25-May-1926  Admit date:     05/25/2023  Discharge date: 05/30/23  Discharge Physician: Alba Cory   PCP: Wanda Plump, MD   Recommendations at discharge:   Needs to follow up with Ortho in 1 week PWB 25% for 2 weeks then can progress to WBAT  Monitor Hb   Discharge Diagnoses: Principal Problem:   Femoral fracture (HCC) Active Problems:   Essential hypertension   PACEMAKER-Medtronic   Laceration of arm   Leukocytosis   Osteoporosis  Resolved Problems:   * No resolved hospital problems. *  Hospital Course: 89 year old with past medical history significant for A-fib, heart block status post pacemaker, chronic anticoagulation, lives independently, has a history of repeated falls, fell the day of admission, lost her balance fell down and twisted her left lower extremity.  Evaluation in the ED she was found to have left femoral fracture. She has a history of vertigo prone to motion sickness.  She received Dilaudid in the ER for pain.    Assessment and Plan: 1-Left Femoral Fracture: Presented after a fall, she has a history of recurrent falls. Pain management Ortho Consulted, underwent left cephalomedullary nail on 2/21 by Dr. Thornell Mule PRN Dilaudid. Partial weightbearing 25% for 2 weeks then can progress to WBAT Needs rehab Monitor hb Stable for discharge  2-Osteoporosis: Normal Vitamin D. Follow up Out patient On calcium supplements.    Leukocytosis: UA negative  for infection Normalized   Laceration of arm: Local care   Chronic A-fib, heart block status post pacemaker On Metoprolol Eliquis was resume.    Essential hypertension On metoprolol.   Vertigo; Balance issues.  B 12; 639.   Acute Blood loss anemia; -post sx expected.of no clinical significance. -hb down to 8.5 Started  iron supplement, and folic acid Hb stable.   Acute Delirium, metabolic  encephalopathy Post sx, in setting hospitalization. Grandniece noticed that patient was confused yesterday Delirium precaution  Estimated body mass index is 20.37 kg/m as calculated from the following:   Height as of this encounter: 5\' 3"  (1.6 m).   Weight as of this encounter: 52.2 kg.  No changes in medical condition. Stable for discharge      Consultants: Dr Thornell Mule  Procedures performed: Ortho Consulted, underwent left cephalomedullary nail on 2/21 by Dr. Thornell Mule Disposition: Skilled nursing facility Diet recommendation:  Discharge Diet Orders (From admission, onward)     Start     Ordered   05/29/23 0000  Diet - low sodium heart healthy        05/29/23 0955           Cardiac diet DISCHARGE MEDICATION: Allergies as of 05/30/2023       Reactions   Lisinopril Cough   Anesthetics, Ester Nausea And Vomiting   All Anesthetics   Other Dermatitis, Rash   Tegaderm (clear occlusive dressing) Pt was miserable        Medication List     TAKE these medications    acetaminophen 325 MG tablet Commonly known as: TYLENOL Take 2 tablets (650 mg total) by mouth every 6 (six) hours as needed for mild pain (pain score 1-3) (or Fever >/= 101).   apixaban 2.5 MG Tabs tablet Commonly known as: Eliquis Take 1 tablet (2.5 mg total) by mouth 2 (two) times daily. What changed: how much to take   CALCIUM 600/VITAMIN D PO Take 1 capsule by mouth daily with lunch.  cholecalciferol 1000 units tablet Commonly known as: VITAMIN D Take 1,000 Units by mouth daily.   ezetimibe 10 MG tablet Commonly known as: ZETIA TAKE 1 TABLET BY MOUTH DAILY   Fish Oil 1200 MG Caps Take 1,200 mg by mouth daily as needed.   folic acid 1 MG tablet Commonly known as: FOLVITE Take 1 tablet (1 mg total) by mouth daily.   iron polysaccharides 150 MG capsule Commonly known as: NIFEREX Take 1 capsule (150 mg total) by mouth daily.   losartan 50 MG tablet Commonly known as:  COZAAR TAKE 1 TABLET BY MOUTH DAILY   metoprolol tartrate 25 MG tablet Commonly known as: LOPRESSOR TAKE ONE-HALF TABLET BY MOUTH  TWICE DAILY   multivitamin per tablet Take 1 tablet by mouth daily.   oxyCODONE 5 MG immediate release tablet Commonly known as: Oxy IR/ROXICODONE Take 1 tablet (5 mg total) by mouth every 6 (six) hours as needed for up to 3 days for moderate pain (pain score 4-6).   polyethylene glycol 17 g packet Commonly known as: MIRALAX / GLYCOLAX Take 17 g by mouth 2 (two) times daily.   senna-docusate 8.6-50 MG tablet Commonly known as: Senokot-S Take 2 tablets by mouth 2 (two) times daily.   Tart Cherry Advanced Caps Take 1 capsule by mouth at bedtime.        Follow-up Information     Wanda Plump, MD Follow up.   Specialty: Internal Medicine Contact information: [address] [place] [address] [phone]                Discharge Exam: Ceasar Mons Weights   05/25/23 1638  Weight: 52.2 kg   General; NAD  Condition at discharge: stable  The results of significant diagnostics from this hospitalization (including imaging, microbiology, ancillary and laboratory) are listed below for reference.   Imaging Studies: DG FEMUR MIN 2 VIEWS LEFT Result Date: 05/26/2023 CLINICAL DATA:  Elective surgery.  Left hip cephalomedullary nail. EXAM: LEFT FEMUR 2 VIEWS COMPARISON:  Left femur radiographs 05/25/2023 FINDINGS: Images were performed intraoperatively without the presence of a radiologist. The patient is undergoing cephalomedullary nail fixation of the previously seen intertrochanteric fracture. Improved alignment. Displaced lesser trochanter again noted. Total fluoroscopy images: 5 Total fluoroscopy time: 148 seconds Total dose: Radiation Exposure Index (as provided by the fluoroscopic device): 20.4 mGy air Kerma Please see intraoperative findings for further detail. IMPRESSION: Intraoperative fluoroscopy for left hip cephalomedullary  nail fixation. Electronically Signed   By: Neita Garnet M.D.   On: 05/26/2023 21:04   DG C-Arm 1-60 Min-No Report Result Date: 05/26/2023 Fluoroscopy was utilized by the requesting physician.  No radiographic interpretation.   DG C-Arm 1-60 Min-No Report Result Date: 05/26/2023 Fluoroscopy was utilized by the requesting physician.  No radiographic interpretation.   CT Head Wo Contrast Result Date: 05/25/2023 CLINICAL DATA:  Fall, on anticoagulation. EXAM: CT HEAD WITHOUT CONTRAST TECHNIQUE: Contiguous axial images were obtained from the base of the skull through the vertex without intravenous contrast. RADIATION DOSE REDUCTION: This exam was performed according to the departmental dose-optimization program which includes automated exposure control, adjustment of the mA and/or kV according to patient size and/or use of iterative reconstruction technique. COMPARISON:  Head CT 09/11/2021 FINDINGS: Brain: No intracranial hemorrhage, mass effect, or midline shift. Stable degree of atrophy and chronic small vessel ischemia. No hydrocephalus. The basilar cisterns are patent. No evidence of territorial infarct or acute ischemia. No extra-axial or intracranial fluid collection. Vascular: Atherosclerosis of  skullbase vasculature without hyperdense vessel or abnormal calcification. Skull: No fracture or focal lesion. Sinuses/Orbits: No acute finding. Other: None. IMPRESSION: 1. No acute intracranial abnormality. No skull fracture. 2. Stable atrophy and chronic small vessel ischemia. Electronically Signed   By: Narda Rutherford M.D.   On: 05/25/2023 17:49   DG Humerus Right Result Date: 05/25/2023 CLINICAL DATA:  Fall with abrasion over right forearm. EXAM: RIGHT HUMERUS - 2+ VIEW COMPARISON:  None Available. FINDINGS: There is no evidence of fracture or other focal bone lesions. Cortical margins of the humerus are intact. Chronic change about the elbow. Chronic change about the glenohumeral joint. Soft tissues are  unremarkable. IMPRESSION: No fracture of the humerus. Electronically Signed   By: Narda Rutherford M.D.   On: 05/25/2023 17:32   DG HIP UNILAT W OR W/O PELVIS 2-3 VIEWS LEFT Result Date: 05/25/2023 CLINICAL DATA:  Pain after fall. EXAM: DG HIP (WITH OR WITHOUT PELVIS) 2-3V LEFT; LEFT FEMUR 2 VIEWS COMPARISON:  None Available. FINDINGS: Pelvis and hip: Comminuted displaced intertrochanteric left proximal femur fracture. There is mild proximal migration of the femoral shaft. No hip dislocation. No additional fracture of the pelvis. Pubic rami are intact. Pubic symphysis and sacroiliac joints are congruent. Moderate right hip osteoarthritis with joint space narrowing and spurring. Femur: Proximal femur fracture as described above. The distal femur is intact. The bones are subjectively under mineralized. Knee osteoarthritis, no knee dislocation. Vascular calcifications are seen. IMPRESSION: Comminuted displaced intertrochanteric left proximal femur fracture. Electronically Signed   By: Narda Rutherford M.D.   On: 05/25/2023 17:31   DG Femur Min 2 Views Left Result Date: 05/25/2023 CLINICAL DATA:  Pain after fall. EXAM: DG HIP (WITH OR WITHOUT PELVIS) 2-3V LEFT; LEFT FEMUR 2 VIEWS COMPARISON:  None Available. FINDINGS: Pelvis and hip: Comminuted displaced intertrochanteric left proximal femur fracture. There is mild proximal migration of the femoral shaft. No hip dislocation. No additional fracture of the pelvis. Pubic rami are intact. Pubic symphysis and sacroiliac joints are congruent. Moderate right hip osteoarthritis with joint space narrowing and spurring. Femur: Proximal femur fracture as described above. The distal femur is intact. The bones are subjectively under mineralized. Knee osteoarthritis, no knee dislocation. Vascular calcifications are seen. IMPRESSION: Comminuted displaced intertrochanteric left proximal femur fracture. Electronically Signed   By: Narda Rutherford M.D.   On: 05/25/2023 17:31    DG Chest 1 View Result Date: 05/25/2023 CLINICAL DATA:  Pain after fall. EXAM: CHEST  1 VIEW COMPARISON:  None Available. FINDINGS: Dual lead left-sided pacemaker in place. The cardiomediastinal contours are normal. The lungs are clear. Pulmonary vasculature is normal. No consolidation, pleural effusion, or pneumothorax. No acute osseous abnormalities are seen. IMPRESSION: No acute findings or evidence of traumatic injury. Electronically Signed   By: Narda Rutherford M.D.   On: 05/25/2023 17:29   CUP PACEART REMOTE DEVICE CHECK Result Date: 05/19/2023 Scheduled remote reviewed. Normal device function.  14 VHR episodes, longest 15 sec, Marker Channels and AEGM only available for review, c/w AF with RVR, on Eliquis and Metoprolol. Next remote 91 days. [facility], [facility]   Microbiology: Results for orders placed or performed during the hospital encounter of 05/25/23  Surgical pcr screen     Status: None   Collection Time: 05/26/23  6:32 AM   Specimen: Nasal Mucosa; Nasal Swab  Result Value Ref Range Status   MRSA, PCR NEGATIVE NEGATIVE Final   Staphylococcus aureus NEGATIVE NEGATIVE Final    Comment: (NOTE) The Xpert SA Assay (FDA approved for  NASAL specimens in patients 10 years of age and older), is one component of a comprehensive surveillance program. It is not intended to diagnose infection nor to guide or monitor treatment. Performed at [facility], [address], [place], [place] [address]     Labs: CBC: Recent Labs  Lab 05/25/23 1715 05/26/23 0450 05/27/23 0427 05/28/23 0611 05/29/23 0628  WBC 10.9* 13.8* 11.5* 9.7 9.4  HGB 12.8 10.9* 9.6* 8.5* 8.4*  HCT 39.3 32.5* 28.6* 25.9* 25.5*  MCV 87.9 84.9 85.9 87.5 86.7  PLT 195 183 157 147* 161   Basic Metabolic Panel: Recent Labs  Lab 05/25/23 1715 05/26/23 0450 05/27/23 0427 05/28/23 0611  NA 139 137 136 136  K 3.7 4.4 4.4 3.9  CL 102 102 103 107  CO2 24 23 25 24   GLUCOSE 150* 113* 130* 106*  BUN 19 14 14 11    CREATININE 0.77 0.60 0.73 0.72  CALCIUM 9.1 9.2 8.8* 8.3*  PHOS  --  4.3  --   --    Liver Function Tests: No results for input(s): "AST", "ALT", "ALKPHOS", "BILITOT", "PROT", "ALBUMIN" in the last 168 hours. CBG: No results for input(s): "GLUCAP" in the last 168 hours.  Discharge time spent: greater than 30 minutes.  Signed: Alba Cory, MD [facility] 05/30/2023

## 2023-05-30 NOTE — Progress Notes (Signed)
Report called to [facility] 

## 2023-05-30 NOTE — TOC Transition Note (Signed)
 Transition of Care [facility]) - Discharge Note   Patient Details  Name: Peggy Mendez MRN: [id number] Date of Birth: 02-27-1927  Transition of [facility]) CM/SW Contact:  Lorri Frederick, LCSW Phone Number: 05/30/2023, 1:26 PM   Clinical Narrative:   Pt discharging to [facility], room 405.  RN call report to [phone].    Final next level of care: Skilled Nursing Facility Barriers to Discharge: Barriers Resolved   Patient Goals and CMS Choice Patient states their goals for this hospitalization and ongoing recovery are:: "I'm not sure yet" CMS Medicare.gov Compare Post Acute Care list provided to:: Patient Choice offered to / list presented to : Patient      Discharge Placement              Patient chooses bed at: [facility] Patient to be transferred to facility by: [facility] Name of family member notified: niece Pam Patient and family notified of of transfer: 05/30/23  Discharge Plan and Services Additional resources added to the After Visit Summary for   In-house Referral: Clinical Social Work   Post Acute Care Choice: Skilled Nursing Facility                               Social Drivers of Health (SDOH) Interventions SDOH Screenings   Food Insecurity: No Food Insecurity (05/25/2023)  Housing: Low Risk  (05/25/2023)  Transportation Needs: No Transportation Needs (05/25/2023)  Utilities: Not At Risk (05/25/2023)  Alcohol Screen: Low Risk  (02/16/2018)  Depression (PHQ2-9): Low Risk  (04/07/2023)  Social Connections: Socially Isolated (05/25/2023)  Tobacco Use: Medium Risk (05/26/2023)     Readmission Risk Interventions     No data to display

## 2023-05-30 NOTE — Progress Notes (Signed)
 Physical Therapy Treatment Patient Details Name: Peggy Mendez MRN: [id number] DOB: 06/08/1926 Today's Date: 05/30/2023   History of Present Illness 89 y.o. female presents to [facility] 05/25/23 after falling while bending over at home. Admitted with L hip intertochanteric fx, s/p L hip cephalomedullary nail 2/21. PMHx: HTN, PAF, PPM (2008), chronic vertigo, osteoporosis.    PT Comments  Pt received in supine after premedication by RN, pt agreeable to bed-level therapy session to work on BLE ROM/strengthening but defers EOB/OOB mobility due to upcoming DC and pt wanting to avoid severe pain. Pt self-directed but needs multimodal cues for unfamiliar tasks and increased time to process some cues this date. Pt needing AAROM for LLE exercises but mostly AROM on RLE and HEP handout given to reinforce LLE ROM and also included instruction on LLE PWB 25% in handout. Pt will continue to benefit from skilled rehab in a post acute setting to maximize functional gains before returning home.    If plan is discharge home, recommend the following: A lot of help with bathing/dressing/bathroom;Assist for transportation;Help with stairs or ramp for entrance;Assistance with cooking/housework;Two people to help with walking and/or transfers;Supervision due to cognitive status   Can travel by private vehicle     No  Equipment Recommendations  BSC/3in1;Wheelchair (measurements PT);Wheelchair cushion (measurements PT)    Recommendations for Other Services       Precautions / Restrictions Precautions Precautions: Fall Recall of Precautions/Restrictions: Impaired Precaution/Restrictions Comments: pt initially states "I can't put weight through my leg" then when PTA explains PWB 25% she expresses understanding Restrictions Weight Bearing Restrictions Per Provider Order: Yes LLE Weight Bearing Per Provider Order: Partial weight bearing LLE Partial Weight Bearing Percentage or Pounds: 25% x 2 weeks, then 50% x 2  weeks; then WBAT     Mobility  Bed Mobility Overal bed mobility: Needs Assistance Bed Mobility: Supine to Sit     Supine to sit: Mod assist, HOB elevated     General bed mobility comments: modA to long sitting with cues for core activation; pt defers EOB due to upcoming DC and anticipatory pain so returned to supine and instead worked on bed-level crunches with arms crossed at chest.    Transfers Overall transfer level: Needs assistance                 General transfer comment: Pt defers due to upcoming DC, only agreeable to bed-level ROM exercises; PTAR arriving at end of session to transport pt to SNF.    Ambulation/Gait               General Gait Details: Pt not yet agreeable to attempt; PTA providing visual/verbal demo for 25% PWB technique.   Stairs             Wheelchair Mobility     Tilt Bed    Modified Rankin (Stroke Patients Only)       Balance Overall balance assessment: Needs assistance Sitting-balance support: Feet unsupported, No upper extremity supported Sitting balance-Leahy Scale: Poor Sitting balance - Comments: limited assessment due to pt refusing EOB/OOB, pt needing trunk support for unsupported long sitting in her bed.       Standing balance comment: pt defers today                            Communication Communication Communication: No apparent difficulties  Cognition Arousal: Alert Behavior During Therapy: WFL for tasks assessed/performed   PT - Cognitive impairments: Memory,  Problem solving, Sequencing                       PT - Cognition Comments: Pt with decreased understanding of bed-level exercises despite verbal cues and needs dense 1-step multimodal cues for isometric exercises; pt very particular and perseverating on missed phone call for ~8 mins prior to being agreeable to participate in session. Pt agreeable to bed-level activity only and is aware of upcoming DC. Following commands:  Intact      Cueing Cueing Techniques: Verbal cues  Exercises General Exercises - Lower Extremity Ankle Circles/Pumps: AROM, Both, 10 reps, Supine Quad Sets: AROM, AAROM, Both, 10 reps, Supine (tactile cues for technique) Short Arc Quad: AROM, AAROM, Left, 10 reps, Supine Heel Slides: AROM, AAROM, Both, 10 reps, Supine (AROM on RLE, AA on LLE using pillow case as pt reports her skin is too delicate for therapist to touch her calf) Hip ABduction/ADduction: AAROM, AROM, Both, 10 reps, Supine (AA on LLE) Straight Leg Raises: AROM, Right, 10 reps, Supine Other Exercises Other Exercises: Bed crunches x10 reps in reclined supine posture; arms crossed at chest pt able to use core musculature; including 2 brief rest breaks to achieve 10 reps, needs PTA light trunk assist to reach full upright    General Comments General comments (skin integrity, edema, etc.): No acute s/sx distress other than c/o anticipated pain in LLE with ROM. Pt defers ice for pain mgmt      Pertinent Vitals/Pain Pain Assessment Pain Assessment: Faces Faces Pain Scale: Hurts little more Pain Location: LLE with A/AAROM Pain Descriptors / Indicators: Discomfort, Guarding Pain Intervention(s): Limited activity within patient's tolerance, Monitored during session, Premedicated before session, Repositioned, Other (comment) (pt reluctant to increase pain prior to DC so self-limiting but eager to progress OOB mobility once at facility, per her report.)    Home Living                          Prior Function            PT Goals (current goals can now be found in the care plan section) Acute Rehab PT Goals Patient Stated Goal: to get better PT Goal Formulation: With patient Time For Goal Achievement: 06/11/23 Progress towards PT goals: Progressing toward goals    Frequency    Min 1X/week      PT Plan      Co-evaluation              AM-PAC PT "6 Clicks" Mobility   Outcome Measure  Help needed  turning from your back to your side while in a flat bed without using bedrails?: A Lot Help needed moving from lying on your back to sitting on the side of a flat bed without using bedrails?: A Lot Help needed moving to and from a bed to a chair (including a wheelchair)?: Total Help needed standing up from a chair using your arms (e.g., wheelchair or bedside chair)?: Total Help needed to walk in hospital room?: Total Help needed climbing 3-5 steps with a railing? : Total 6 Click Score: 8    End of Session   Activity Tolerance: Patient limited by pain;Other (comment) (pt refusing EOB/OOB due to anticipatory pain/upcoming DC) Patient left: with call bell/phone within reach;in bed;with bed alarm set;Other (comment) (heels floated) Nurse Communication: Mobility status;Precautions;Weight bearing status PT Visit Diagnosis: Other abnormalities of gait and mobility (R26.89);Muscle weakness (generalized) (M62.81);History of falling (Z91.81);Unsteadiness on feet (  R26.81)     Time: 7846-9629 PT Time Calculation (min) (ACUTE ONLY): 24 min  Charges:    $Therapeutic Exercise: 23-37 mins PT General Charges $$ ACUTE PT VISIT: 1 Visit                     Ronda Kazmi P., PTA Acute Rehabilitation Services Secure Chat Preferred 9a-5:30pm Office: [phone]    Dorathy Kinsman [facility] 05/30/2023, 3:20 PM

## 2023-05-30 NOTE — Progress Notes (Signed)
.  Subjective: 4 Days Post-Op Procedure(s) (LRB): LEFT HIP CEPHALOMEDULLARY NAIL (Left)  Patient seen and examined. Pain slowly improving but still painful with movement and trying to mobilize   Activity level:  PWB x25% Diet tolerance:  as tolerated Patient reports pain as moderate.   Objective: Vital signs in last 24 hours: Temp:  [97.8 F (36.6 C)-98.2 F (36.8 C)] 98.1 F (36.7 C) (02/25 0711) Pulse Rate:  [90-100] 90 (02/25 0711) Resp:  [16] 16 (02/24 2000) BP: (112-132)/(77-84) 127/77 (02/25 0711) SpO2:  [97 %-98 %] 97 % (02/25 0711)  Labs: Recent Labs    05/28/23 0611 05/29/23 0628  HGB 8.5* 8.4*   Recent Labs    05/28/23 0611 05/29/23 0628  WBC 9.7 9.4  RBC 2.96* 2.94*  HCT 25.9* 25.5*  PLT 147* 161   Recent Labs    05/28/23 0611  NA 136  K 3.9  CL 107  CO2 24  BUN 11  CREATININE 0.72  GLUCOSE 106*  CALCIUM 8.3*   No results for input(s): "LABPT", "INR" in the last 72 hours.  Physical Exam:  Neurologically intact Neurovascular intact Sensation intact distally Intact pulses distally Dorsiflexion/Plantar flexion intact Incision: dressing C/D/I Compartment soft  Assessment/Plan:  4 Days Post-Op Procedure(s) (LRB): LEFT HIP CEPHALOMEDULLARY NAIL (Left) PWB 25% for 2 weeks then can progress to WBAT PT/OT DVT ppx: Home Eliquis restarted Pain control as needed Hgb stabilized DC pending SNF   Luci Bank 05/30/2023, 9:54 AM

## 2023-05-30 NOTE — Progress Notes (Signed)
 Reached out to Ortho per Attendings request. Pt has three blisters around surgical incision site. Advised this could be related to the Tegaderm used. Per Dr Hulda Humphrey, can replace with mepilex and pt is clear for discharge.

## 2023-06-05 IMAGING — MG MM DIGITAL DIAGNOSTIC UNILAT*R*
4 series · 4 of 4 positions shown · non-contrast
Comparison: Previous exam(s).

CLINICAL DATA: Patient returns after screening study for evaluation
of possible RIGHT breast calcifications.

EXAM:
DIGITAL DIAGNOSTIC UNILATERAL RIGHT MAMMOGRAM
TECHNIQUE: Right digital diagnostic mammography was performed. Mammographic
images were processed with CAD.

[R CC]
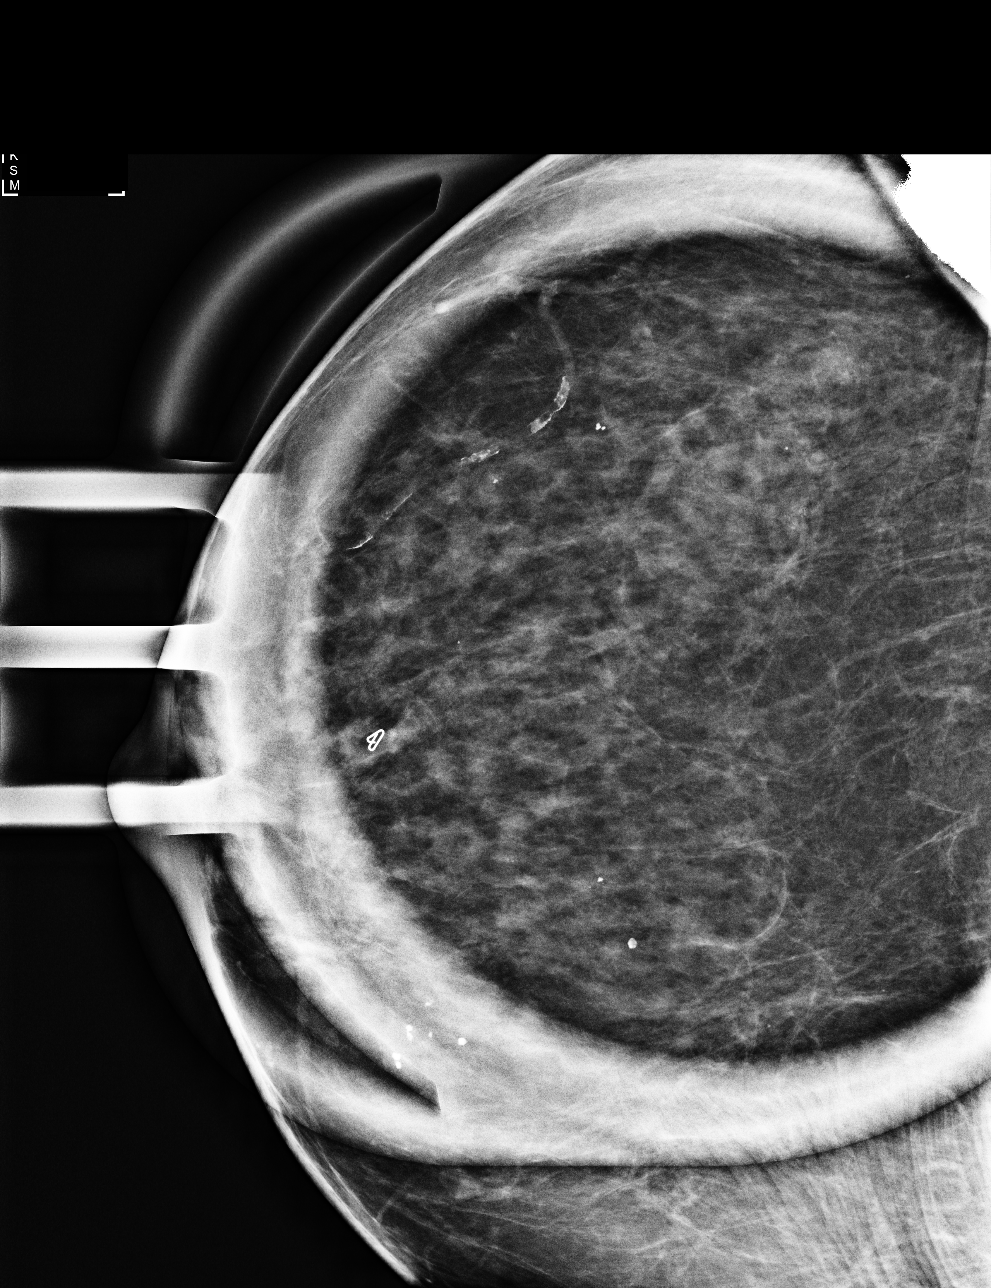

[R ML (1 of 3)]
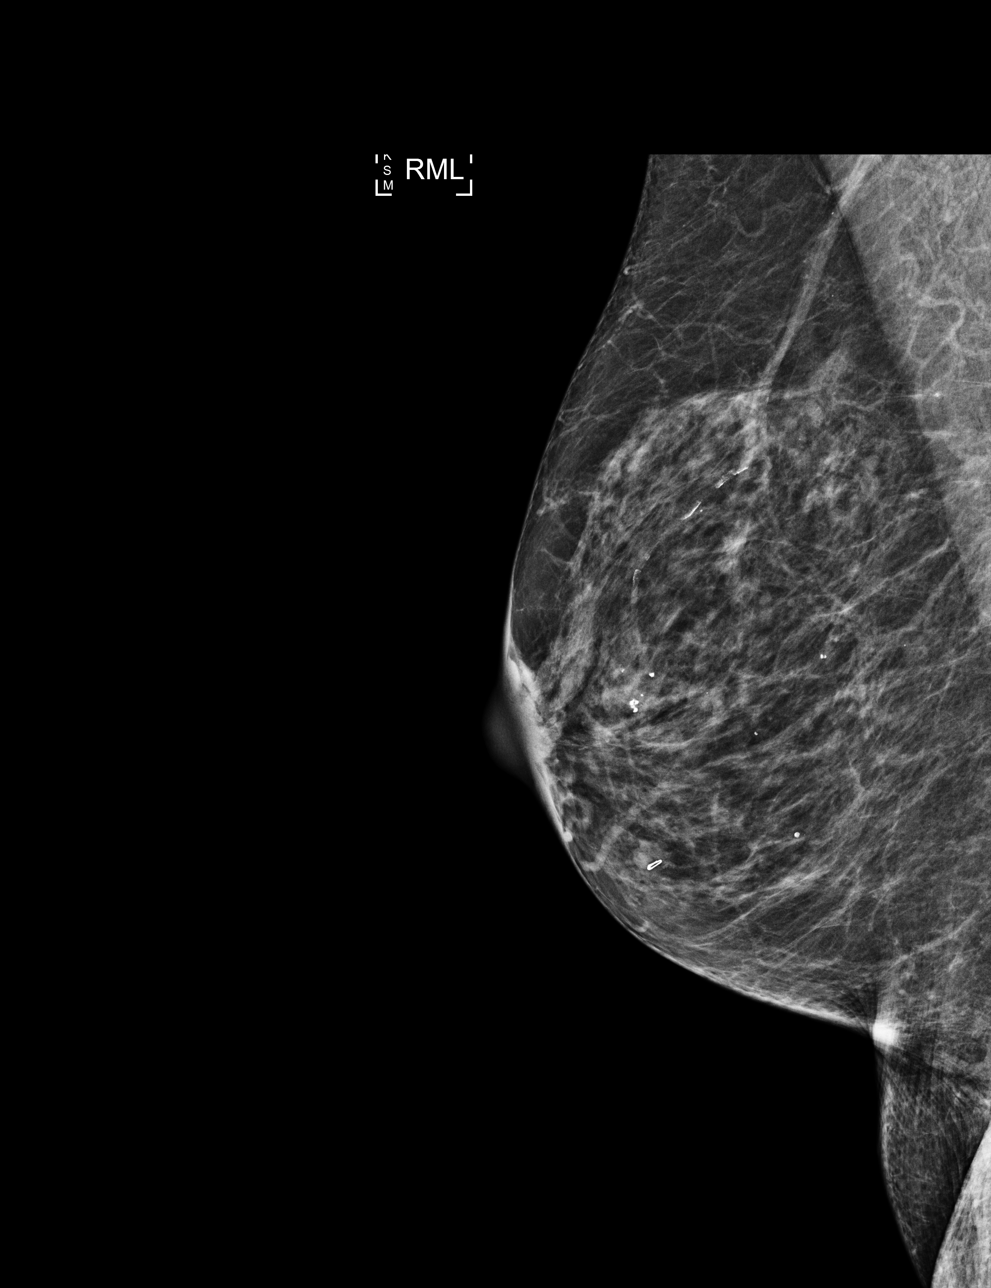

[R ML (2 of 3)]
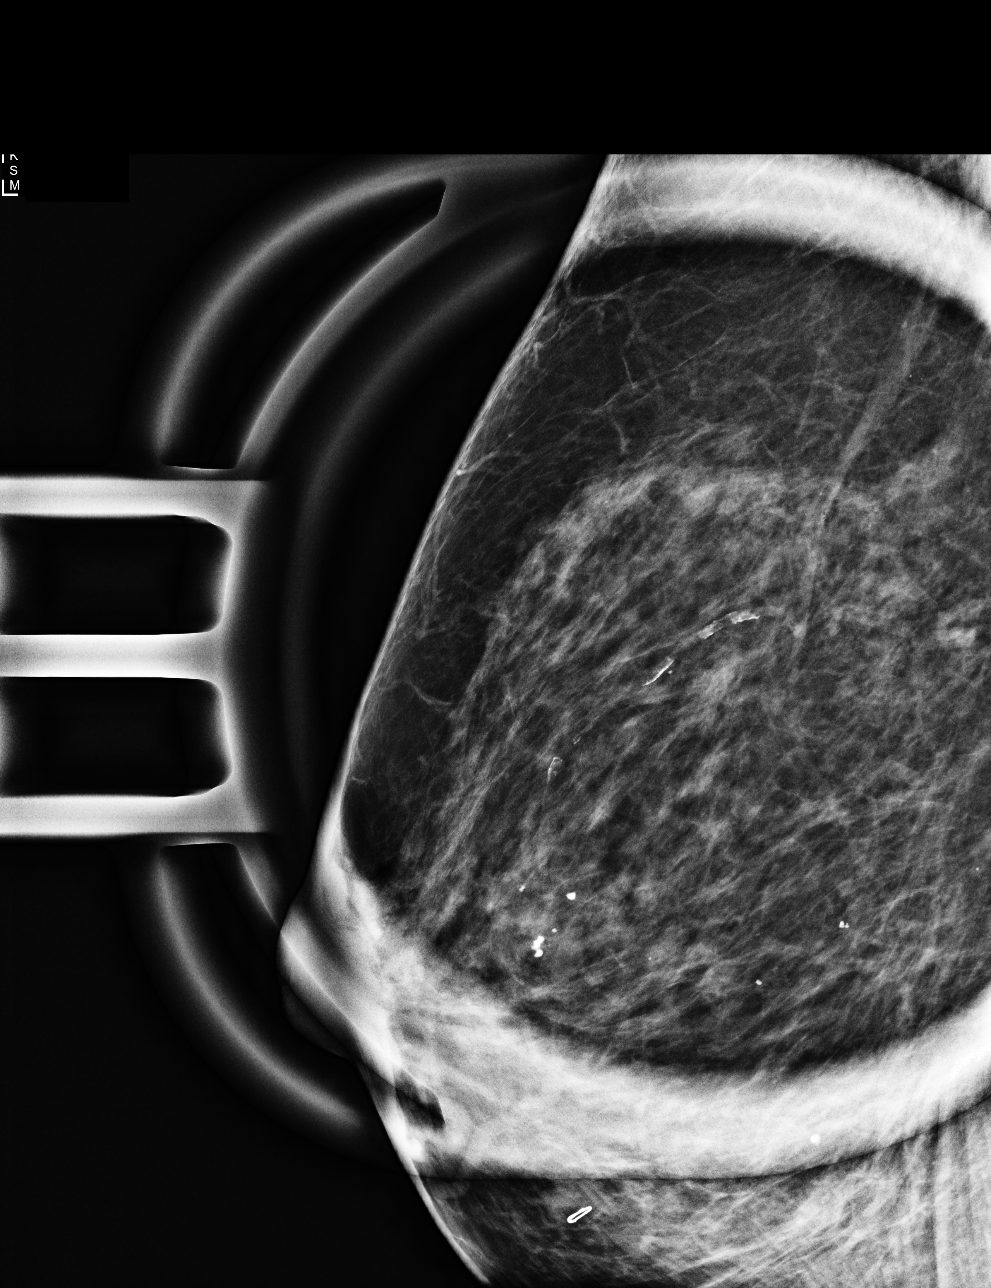

[R ML (3 of 3)]
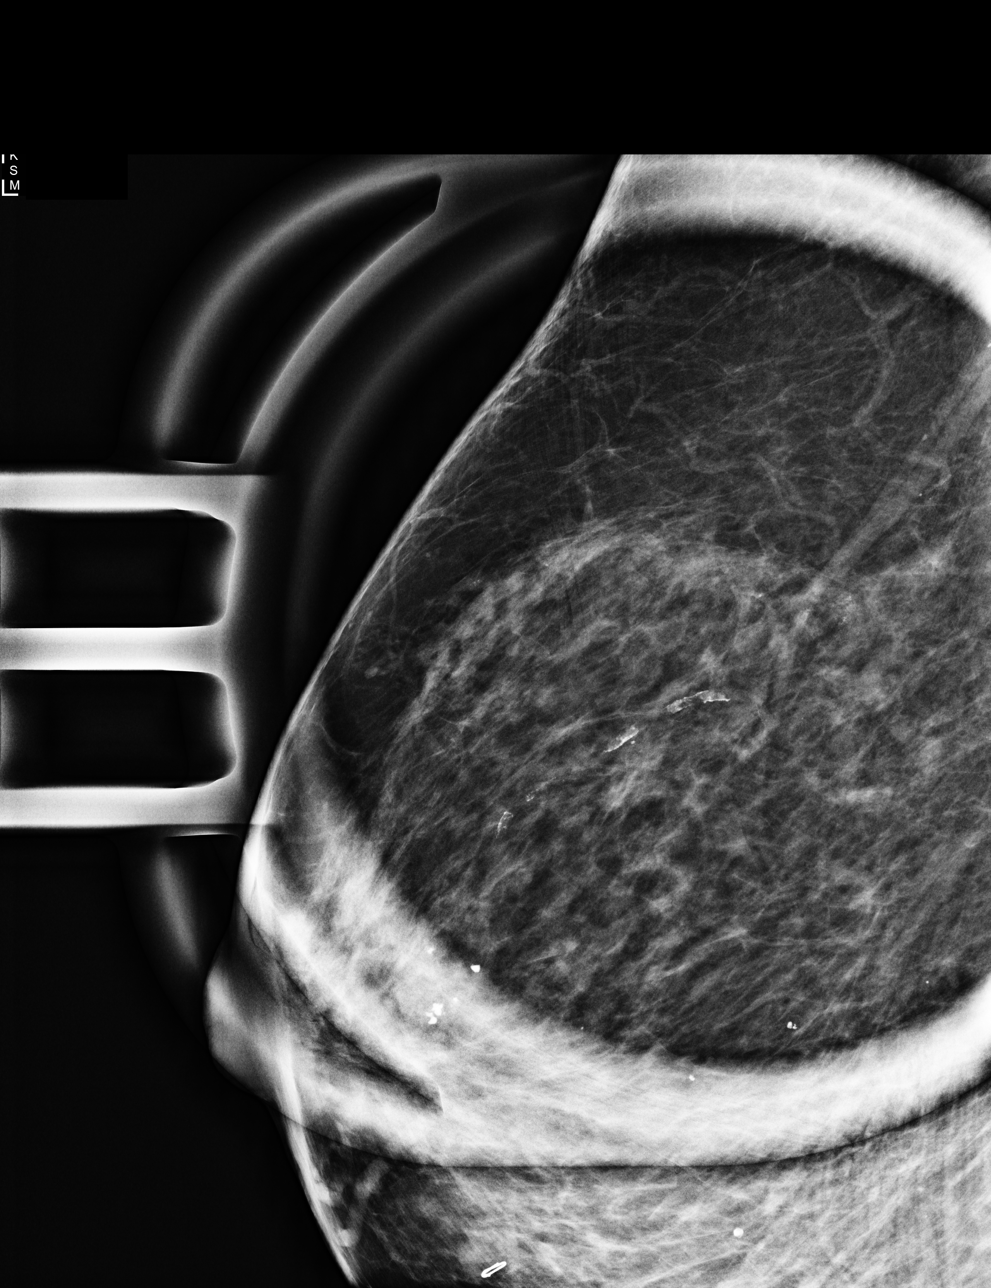

[4 of 4 positions shown; findings below may reference images not displayed]

ACR Breast Density Category c: The breast tissue is heterogeneously
dense, which may obscure small masses.
FINDINGS: Magnified views are performed of calcifications in the UPPER-OUTER
QUADRANT of the RIGHT breast. These views demonstrate a 3 millimeter
group of faint pleomorphic calcifications. There is no associated
mass or distortion.
IMPRESSION: Indeterminate 3 millimeter group of calcifications in the
UPPER-OUTER QUADRANT of the RIGHT breast.

RECOMMENDATION:
Stereotactic biopsy of RIGHT breast calcifications.

I have discussed the findings and recommendations with the patient.
If applicable, a reminder letter will be sent to the patient
regarding the next appointment.

BI-RADS CATEGORY  4: Suspicious.

## 2023-06-22 NOTE — Progress Notes (Signed)
 Remote pacemaker transmission.

## 2023-06-22 NOTE — Addendum Note (Signed)
 Addended by: Elease Etienne A on: 06/22/2023 08:58 AM   Modules accepted: Orders

## 2023-07-27 ENCOUNTER — Other Ambulatory Visit: Payer: Self-pay | Admitting: Internal Medicine

## 2023-07-27 DIAGNOSIS — I480 Paroxysmal atrial fibrillation: Secondary | ICD-10-CM

## 2023-08-16 ENCOUNTER — Ambulatory Visit (INDEPENDENT_AMBULATORY_CARE_PROVIDER_SITE_OTHER): Payer: Medicare Other

## 2023-08-16 DIAGNOSIS — I442 Atrioventricular block, complete: Secondary | ICD-10-CM

## 2023-08-16 LAB — CUP PACEART REMOTE DEVICE CHECK
Battery Impedance: 576 Ohm
Battery Remaining Longevity: 95 mo
Battery Voltage: 2.78 V
Brady Statistic RV Percent Paced: 28 %
Date Time Interrogation Session: 20250514084444
Implantable Lead Connection Status: 753985
Implantable Lead Connection Status: 753985
Implantable Lead Implant Date: 20080318
Implantable Lead Implant Date: 20080318
Implantable Lead Location: 753859
Implantable Lead Location: 753860
Implantable Lead Model: 5076
Implantable Lead Model: 5076
Implantable Pulse Generator Implant Date: 20170825
Lead Channel Impedance Value: 549 Ohm
Lead Channel Impedance Value: 67 Ohm
Lead Channel Pacing Threshold Amplitude: 0.875 V
Lead Channel Pacing Threshold Pulse Width: 0.4 ms
Lead Channel Setting Pacing Amplitude: 2.5 V
Lead Channel Setting Pacing Pulse Width: 0.4 ms
Lead Channel Setting Sensing Sensitivity: 5.6 mV
Zone Setting Status: 755011
Zone Setting Status: 755011

## 2023-08-20 ENCOUNTER — Ambulatory Visit: Payer: Self-pay | Admitting: Cardiology

## 2023-09-15 NOTE — Progress Notes (Signed)
 Cardiology Office Note:    Date:  09/20/2023   ID:  Peggy Mendez, DOB 02-08-1927, MRN [id number]  PCP:  Ezell Hollow, MD   [facility] Providers Cardiologist:  Chetan Mehring [place], MD Electrophysiologist:  Boyce Byes, MD {  Referring MD: Ezell Hollow, MD   Chief Complaint  Patient presents with   Atrial Fibrillation     History of Present Illness:    Peggy Mendez is a 89 y.o. female with a hx of HTN, HLD, history of syncope, PAF on Eliquis , sick sinus syndrome s/p PPM.  She initially had a Medtronic dual-chamber pacemaker placed in 2008 for complete heart block.  She underwent generator change out in August 2017.  She was noted to be in atrial fibrillation on previous pacemaker interrogation and was placed on Eliquis .  She has a history of statin intolerance and has been on Zetia .  She has been staying in atrial fibrillation 100% of the time since the end of February 2022.  She is asymptomatic. Last pacer check May was acceptable.   She did fall in Feb and fractured her hip. Had intramedullary nail placed. Was in Rehab at [place] and has since moved to assisted living at [place]. Getting PT still. Noted swelling in left leg to point of needing it wrapped. This has improved. Walking some with a walker and getting around in wheelchair.   Past Medical History:  Diagnosis Date   Cataract    REMOVED   History of colonic polyps    Hyperlipidemia    Hypertension    Osteopenia    Paroxysmal atrial fibrillation (HCC)    PONV (postoperative nausea and vomiting)    Sick sinus syndrome (HCC)    Skin cancer 2010   Left leg, SCC, sees derm   Syncope 11/2005    Past Surgical History:  Procedure Laterality Date   APPENDECTOMY     BREAST BIOPSY Bilateral    several, 11/2014 showed fibrocystic changes, no evidence of malignancy   BREAST BIOPSY Right 11/2015   cataracts Bilateral    COLONOSCOPY W/ BIOPSIES     EP IMPLANTABLE DEVICE N/A 11/27/2015   MDT Adapta L gen  change by Dr Nunzio Belch   HEMORRHOID SURGERY     INTRAMEDULLARY (IM) NAIL INTERTROCHANTERIC Left 05/26/2023   Procedure: LEFT HIP CEPHALOMEDULLARY NAIL;  Surgeon: Gaylon Kea, MD;  Location: [facility] OR;  Service: Orthopedics;  Laterality: Left;   PPM  2008   MDT dual chamber PPM implanted by Dr Ardyce Bee for sick sinus and intermittent CHB   SKIN CANCER EXCISION Right 03/2022   R face   TOTAL ABDOMINAL HYSTERECTOMY     oophorectomy   US  ECHOCARDIOGRAPHY  12/08/2005   EF 55-60%    Current Medications: Current Meds  Medication Sig   apixaban  (ELIQUIS ) 2.5 MG TABS tablet Take 1 tablet (2.5 mg total) by mouth 2 (two) times daily.   Calcium  Carbonate-Vitamin D  (CALCIUM  600/VITAMIN D  PO) Take 1 capsule by mouth daily with lunch.   cholecalciferol  (VITAMIN D ) 1000 units tablet Take 1,000 Units by mouth daily.   ezetimibe  (ZETIA ) 10 MG tablet TAKE 1 TABLET BY MOUTH DAILY   folic acid  (FOLVITE ) 1 MG tablet Take 1 tablet (1 mg total) by mouth daily.   losartan  (COZAAR ) 50 MG tablet TAKE 1 TABLET BY MOUTH DAILY   metoprolol  tartrate (LOPRESSOR ) 25 MG tablet TAKE ONE-HALF TABLET BY MOUTH  TWICE DAILY   Misc Natural Products (TART CHERRY ADVANCED) CAPS Take 1 capsule by  mouth at bedtime.   multivitamin (THERAGRAN) per tablet Take 1 tablet by mouth daily.   Omega-3 Fatty Acids (FISH OIL) 1200 MG CAPS Take 1,200 mg by mouth daily as needed. (Patient taking differently: Take 1,200 mg by mouth daily at 6 (six) AM.)     Allergies:   Lisinopril; Anesthetics, ester; and Other   Social History   Socioeconomic History   Marital status: Single    Spouse name: Not on file   Number of children: 0   Years of education: Not on file   Highest education level: Not on file  Occupational History   Occupation: retired     Associate Professor: RETIRED  Tobacco Use   Smoking status: Former    Current packs/day: 0.00    Types: Cigarettes    Quit date: 08/23/1988    Years since quitting: 35.0   Smokeless tobacco: Never    Tobacco comments:    >20 yrs ago, used to smoke ~ 1 ppd  Vaping Use   Vaping status: Never Used  Substance and Sexual Activity   Alcohol use: Yes    Comment: socially   Drug use: Yes    Types: IV   Sexual activity: Not on file  Other Topics Concern   Not on file  Social History Narrative   Lives by herself, in a condominium   Still drives.   Lost her  twin sister 12/2019   Has one great niece in town, communicate w/ extended family members     Brother in Social worker passed 11-2020       Social Drivers of Health   Financial Resource Strain: Not on file  Food Insecurity: No Food Insecurity (05/25/2023)   Hunger Vital Sign    Worried About Running Out of Food in the Last Year: Never true    Ran Out of Food in the Last Year: Never true  Transportation Needs: No Transportation Needs (05/25/2023)   PRAPARE - Administrator, Civil Service (Medical): No    Lack of Transportation (Non-Medical): No  Physical Activity: Not on file  Stress: Not on file  Social Connections: Socially Isolated (05/25/2023)   Social Connection and Isolation Panel    Frequency of Communication with Friends and Family: Once a week    Frequency of Social Gatherings with Friends and Family: Once a week    Attends Religious Services: Never    Database administrator or Organizations: No    Attends Engineer, structural: Never    Marital Status: Never married     Family History: The patient's family history includes Breast cancer (age of onset: 36) in her sister; Coronary artery disease in an other family member; Heart disease in her father; Heart failure in her mother; Hypertension in her sister; Prostate cancer in her brother. There is no history of Colon cancer or Diabetes.  ROS:   Please see the history of present illness.     All other systems reviewed and are negative.  EKGs/Labs/Other Studies Reviewed:    The following studies were reviewed today:  N/A      Recent Labs: 10/05/2022: ALT  21 05/26/2023: TSH 2.113 05/28/2023: BUN 11; Creatinine, Ser 0.72; Potassium 3.9; Sodium 136 05/29/2023: Hemoglobin 8.4; Platelets 161  Recent Lipid Panel    Component Value Date/Time   CHOL 198 04/07/2023 1017   CHOL 220 (H) 12/01/2017 1100   TRIG 80.0 04/07/2023 1017   TRIG 62 06/17/2009 0000   HDL 83.70 04/07/2023 1017   HDL  108 12/01/2017 1100   CHOLHDL 2 04/07/2023 1017   VLDL 16.0 04/07/2023 1017   LDLCALC 99 04/07/2023 1017   LDLCALC 101 (H) 12/01/2017 1100   LDLDIRECT 105.8 09/07/2012 0843     Risk Assessment/Calculations:       Physical Exam:    VS:  BP 114/62 (BP Location: Left Arm, Patient Position: Sitting, Cuff Size: Normal)   Pulse 88   Ht 5' 3 (1.6 m)   Wt 115 lb (52.2 kg)   SpO2 96%   BMI 20.37 kg/m     Wt Readings from Last 3 Encounters:  09/20/23 115 lb (52.2 kg)  05/25/23 115 lb (52.2 kg)  04/07/23 115 lb 4 oz (52.3 kg)     GEN:  Well nourished, thin in no acute distress HEENT: Normal NECK: No JVD; No carotid bruits LYMPHATICS: No lymphadenopathy CARDIAC: IRRR, no murmurs, rubs, gallops RESPIRATORY:  Clear to auscultation without rales, wheezing or rhonchi  ABDOMEN: Soft, non-tender, non-distended MUSCULOSKELETAL:  1+ LLE edema; No deformity  SKIN: Warm and dry NEUROLOGIC:  Alert and oriented x 3 PSYCHIATRIC:  Normal affect   ASSESSMENT:    1. Permanent atrial fibrillation (HCC)   2. Pacemaker   3. Heart block AV complete (HCC)          PLAN:    In order of problems listed above:  Persistent atrial fibrillation: Has been in 100% atrial fibrillation since Feb 2022.  She has no cardiac awareness of atrial fibrillation, heart rate is well controlled.   We will continue with rate control strategy an anticoagulation  Hypertension: Blood pressure is well controlled.   Hyperlipidemia: Continue Zetia  and fish oil  History of pacemaker: Followed by EP service.- Now in VVI mode. Recent pacer check normal.  5.  S/p left hip fracture.  Now in assisted living. Progressing.   Follow  up in 6 months  Medication Adjustments/Labs and Tests Ordered: Current medicines are reviewed at length with the patient today.  Concerns regarding medicines are outlined above.  No orders of the defined types were placed in this encounter.   No orders of the defined types were placed in this encounter.    There are no Patient Instructions on file for this visit.   Signed, Jaques Mineer [place], MD  09/20/2023 3:20 PM    [facility]

## 2023-09-20 ENCOUNTER — Ambulatory Visit: Attending: Cardiology | Admitting: Cardiology

## 2023-09-20 ENCOUNTER — Encounter: Payer: Self-pay | Admitting: Cardiology

## 2023-09-20 VITALS — BP 114/62 | HR 88 | Ht 63.0 in | Wt 115.0 lb

## 2023-09-20 DIAGNOSIS — I4821 Permanent atrial fibrillation: Secondary | ICD-10-CM

## 2023-09-20 DIAGNOSIS — Z950 Presence of cardiac pacemaker: Secondary | ICD-10-CM

## 2023-09-20 DIAGNOSIS — I442 Atrioventricular block, complete: Secondary | ICD-10-CM | POA: Diagnosis not present

## 2023-09-20 NOTE — Patient Instructions (Signed)
 Medication Instructions:  Continue same medications *If you need a refill on your cardiac medications before your next appointment, please call your pharmacy*  Lab Work: None ordered  Testing/Procedures: None ordered  Follow-Up: At [facility], you and your health needs are our priority.  As part of our continuing mission to provide you with exceptional heart care, our providers are all part of one team.  This team includes your primary Cardiologist (physician) and Advanced Practice Providers or APPs (Physician Assistants and Nurse Practitioners) who all work together to provide you with the care you need, when you need it.  Your next appointment:  6 months    Call in July to schedule Dec appointment     Provider:  Dr.Jordan    We recommend signing up for the patient portal called MyChart.  Sign up information is provided on this After Visit Summary.  MyChart is used to connect with patients for Virtual Visits (Telemedicine).  Patients are able to view lab/test results, encounter notes, upcoming appointments, etc.  Non-urgent messages can be sent to your provider as well.   To learn more about what you can do with MyChart, go to [email or web address].

## 2023-09-26 NOTE — Progress Notes (Signed)
 Remote pacemaker transmission.

## 2023-11-06 ENCOUNTER — Ambulatory Visit: Payer: Medicare Other | Admitting: Internal Medicine

## 2023-11-15 ENCOUNTER — Ambulatory Visit: Payer: Medicare Other

## 2023-11-15 DIAGNOSIS — I442 Atrioventricular block, complete: Secondary | ICD-10-CM | POA: Diagnosis not present

## 2023-11-16 ENCOUNTER — Ambulatory Visit: Payer: Self-pay | Admitting: Cardiology

## 2023-11-16 LAB — CUP PACEART REMOTE DEVICE CHECK
Battery Impedance: 653 Ohm
Battery Remaining Longevity: 89 mo
Battery Voltage: 2.79 V
Brady Statistic RV Percent Paced: 26 %
Date Time Interrogation Session: 20250813092737
Implantable Lead Connection Status: 753985
Implantable Lead Connection Status: 753985
Implantable Lead Implant Date: 20080318
Implantable Lead Implant Date: 20080318
Implantable Lead Location: 753859
Implantable Lead Location: 753860
Implantable Lead Model: 5076
Implantable Lead Model: 5076
Implantable Pulse Generator Implant Date: 20170825
Lead Channel Impedance Value: 491 Ohm
Lead Channel Impedance Value: 67 Ohm
Lead Channel Pacing Threshold Amplitude: 1 V
Lead Channel Pacing Threshold Pulse Width: 0.4 ms
Lead Channel Setting Pacing Amplitude: 2.5 V
Lead Channel Setting Pacing Pulse Width: 0.4 ms
Lead Channel Setting Sensing Sensitivity: 5.6 mV
Zone Setting Status: 755011
Zone Setting Status: 755011

## 2023-11-22 ENCOUNTER — Other Ambulatory Visit: Payer: Self-pay | Admitting: Cardiology

## 2023-11-22 DIAGNOSIS — I480 Paroxysmal atrial fibrillation: Secondary | ICD-10-CM

## 2023-11-23 NOTE — Telephone Encounter (Signed)
 Prescription refill request for Eliquis  received. Indication: Afib  Last office visit: 09/20/23 ([place])  Scr: 0.72 (05/28/23)  Age: 89 Weight: 52.2kg  Appropriate dose. Refill sent.

## 2023-12-28 NOTE — Progress Notes (Signed)
 Remote PPM Transmission

## 2024-02-14 ENCOUNTER — Ambulatory Visit: Payer: Medicare Other

## 2024-02-14 DIAGNOSIS — I480 Paroxysmal atrial fibrillation: Secondary | ICD-10-CM | POA: Diagnosis not present

## 2024-02-15 LAB — CUP PACEART REMOTE DEVICE CHECK
Battery Impedance: 679 Ohm
Battery Remaining Longevity: 88 mo
Battery Voltage: 2.79 V
Brady Statistic RV Percent Paced: 26 %
Date Time Interrogation Session: 20251112082609
Implantable Lead Connection Status: 753985
Implantable Lead Connection Status: 753985
Implantable Lead Implant Date: 20080318
Implantable Lead Implant Date: 20080318
Implantable Lead Location: 753859
Implantable Lead Location: 753860
Implantable Lead Model: 5076
Implantable Lead Model: 5076
Implantable Pulse Generator Implant Date: 20170825
Lead Channel Impedance Value: 546 Ohm
Lead Channel Impedance Value: 67 Ohm
Lead Channel Pacing Threshold Amplitude: 1 V
Lead Channel Pacing Threshold Pulse Width: 0.4 ms
Lead Channel Setting Pacing Amplitude: 2.5 V
Lead Channel Setting Pacing Pulse Width: 0.4 ms
Lead Channel Setting Sensing Sensitivity: 5.6 mV
Zone Setting Status: 755011
Zone Setting Status: 755011

## 2024-02-19 NOTE — Progress Notes (Signed)
 Remote PPM Transmission

## 2024-02-20 ENCOUNTER — Ambulatory Visit: Payer: Self-pay | Admitting: Cardiology

## 2024-03-21 ENCOUNTER — Ambulatory Visit: Admitting: Cardiology

## 2024-04-03 ENCOUNTER — Encounter (HOSPITAL_BASED_OUTPATIENT_CLINIC_OR_DEPARTMENT_OTHER): Payer: Self-pay

## 2024-04-03 ENCOUNTER — Ambulatory Visit (HOSPITAL_BASED_OUTPATIENT_CLINIC_OR_DEPARTMENT_OTHER)

## 2024-04-03 VITALS — BP 130/90 | HR 90 | Ht 63.0 in | Wt 118.0 lb

## 2024-04-03 DIAGNOSIS — I10 Essential (primary) hypertension: Secondary | ICD-10-CM | POA: Diagnosis not present

## 2024-04-03 DIAGNOSIS — Z7689 Persons encountering health services in other specified circumstances: Secondary | ICD-10-CM | POA: Diagnosis not present

## 2024-04-03 DIAGNOSIS — E7800 Pure hypercholesterolemia, unspecified: Secondary | ICD-10-CM | POA: Diagnosis not present

## 2024-04-03 DIAGNOSIS — E782 Mixed hyperlipidemia: Secondary | ICD-10-CM | POA: Diagnosis not present

## 2024-04-03 DIAGNOSIS — Z0000 Encounter for general adult medical examination without abnormal findings: Secondary | ICD-10-CM

## 2024-04-03 NOTE — Progress Notes (Signed)
 "  Subjective:   Peggy Mendez [facility] 31-Oct-1926  04/03/2024   CC: Chief Complaint  Patient presents with   Transitions Of Care    Patient is here to get established with PCP. States she would like her blood checked.     HPI: Peggy Mendez is a 89 y.o. female who presents to establish care and for a routine health maintenance exam.  Labs collected at time of visit.   HYPERTENSION: Peggy Mendez presents for the medical management of hypertension.  Patient's current hypertension medication regimen is: Losartan  50mg , Metoprolol  25mg  Patient is  currently taking prescribed medications for HTN.  Patient is  regularly keeping a check on BP at home.  Adhering to low sodium diet: yes Exercising Regularly: intermittently at the nursing home Denies headache, dizziness, CP, SHOB, vision changes.   BP Readings from Last 3 Encounters:  04/03/24 (!) 130/90  09/20/23 114/62  05/30/23 127/77    HYPERLIPIDEMIA: Peggy Mendez presents for the medical management of hyperlipidemia.  Patient's current HLD regimen is: Zetia  Patient is currently taking prescribed medications for HLD.  Adhering to heathy diet: yes Exercising regularly: intermittently at her nursing home Denies myalgias.  Lab Results  Component Value Date   CHOL 198 04/07/2023   HDL 83.70 04/07/2023   LDLCALC 99 04/07/2023   LDLDIRECT 105.8 09/07/2012   TRIG 80.0 04/07/2023   CHOLHDL 2 04/07/2023      HEALTH SCREENINGS: - Vision Screening: up to date - Dental Visits: done elsewhere - Pap smear: not applicable - Breast Exam: up to date - STD Screening: Not applicable - Mammogram (40+): Not applicable  - Colonoscopy (45+): Not applicable  - Bone Density (65+ or under 65 with predisposing conditions): Refused  - Lung CA screening with low-dose CT:  Declined Adults age 88-80 who are current cigarette smokers or quit within the last 15 years. Must have 20 pack year history.   Depression and Anxiety  Screen done today and results listed below:     04/07/2023    9:50 AM 10/05/2022   10:00 AM 04/06/2022    9:59 AM 12/07/2021   10:49 AM 04/02/2021    8:53 AM  Depression screen PHQ 2/9  Decreased Interest 0 0 0 0 0  Down, Depressed, Hopeless 0 0 0 0 0  PHQ - 2 Score 0 0 0 0 0       No data to display          IMMUNIZATIONS: - Tdap: Tetanus vaccination status reviewed: last tetanus booster within 10 years. - HPV: Refused - Influenza: Given Elsewhere - Pneumovax: Up to date - Prevnar 20: Up to date - Shingrix (50+): Up to date   Past medical history, surgical history, medications, allergies, family history and social history reviewed with patient today and changes made to appropriate areas of the chart.   Past Medical History:  Diagnosis Date   Cataract    REMOVED   History of colonic polyps    Hyperlipidemia    Hypertension    Osteopenia    Paroxysmal atrial fibrillation (HCC)    PONV (postoperative nausea and vomiting)    Sick sinus syndrome (HCC)    Skin cancer 2010   Left leg, SCC, sees derm   Syncope 11/2005    Past Surgical History:  Procedure Laterality Date   APPENDECTOMY     BREAST BIOPSY Bilateral    several, 11/2014 showed fibrocystic changes, no evidence of malignancy   BREAST BIOPSY Right 11/2015  cataracts Bilateral    COLONOSCOPY W/ BIOPSIES     EP IMPLANTABLE DEVICE N/A 11/27/2015   MDT Adapta L gen change by Dr Kelsie   HEMORRHOID SURGERY     INTRAMEDULLARY (IM) NAIL INTERTROCHANTERIC Left 05/26/2023   Procedure: LEFT HIP CEPHALOMEDULLARY NAIL;  Surgeon: Sherida Adine BROCKS, MD;  Location: [facility] OR;  Service: Orthopedics;  Laterality: Left;   PPM  2008   MDT dual chamber PPM implanted by Dr Ellin for sick sinus and intermittent CHB   SKIN CANCER EXCISION Right 03/2022   R face   TOTAL ABDOMINAL HYSTERECTOMY     oophorectomy   US  ECHOCARDIOGRAPHY  12/08/2005   EF 55-60%    Current Outpatient Medications on File Prior to Visit  Medication Sig    acetaminophen  (TYLENOL ) 325 MG tablet Take 2 tablets (650 mg total) by mouth every 6 (six) hours as needed for mild pain (pain score 1-3) (or Fever >/= 101).   apixaban  (ELIQUIS ) 2.5 MG TABS tablet TAKE 1 TABLET BY MOUTH TWICE  DAILY   Calcium  Carbonate-Vitamin D  (CALCIUM  600/VITAMIN D  PO) Take 1 capsule by mouth daily with lunch.   cholecalciferol  (VITAMIN D ) 1000 units tablet Take 1,000 Units by mouth daily.   ezetimibe  (ZETIA ) 10 MG tablet TAKE 1 TABLET BY MOUTH DAILY   losartan  (COZAAR ) 50 MG tablet TAKE 1 TABLET BY MOUTH DAILY   metoprolol  tartrate (LOPRESSOR ) 25 MG tablet TAKE ONE-HALF TABLET BY MOUTH  TWICE DAILY   Misc Natural Products (TART CHERRY ADVANCED) CAPS Take 1 capsule by mouth at bedtime.   multivitamin (THERAGRAN) per tablet Take 1 tablet by mouth daily.   Omega-3 Fatty Acids (FISH OIL) 1200 MG CAPS Take 1,200 mg by mouth daily as needed.   No current facility-administered medications on file prior to visit.    Allergies[1]   Social History   Socioeconomic History   Marital status: Single    Spouse name: Not on file   Number of children: 0   Years of education: Not on file   Highest education level: Not on file  Occupational History   Occupation: retired     Associate Professor: RETIRED  Tobacco Use   Smoking status: Former    Current packs/day: 0.00    Types: Cigarettes    Quit date: 08/23/1988    Years since quitting: 35.6   Smokeless tobacco: Never   Tobacco comments:    >20 yrs ago, used to smoke ~ 1 ppd  Vaping Use   Vaping status: Never Used  Substance and Sexual Activity   Alcohol use: Yes    Comment: socially   Drug use: Yes    Types: IV   Sexual activity: Not on file  Other Topics Concern   Not on file  Social History Narrative   Lives by herself, in a condominium   Still drives.   Lost her  twin sister 12/2019   Has one great niece in town, communicate w/ extended family members     Brother in social worker passed 11-2020       Social Drivers of Health    Tobacco Use: Medium Risk (04/03/2024)   Patient History    Smoking Tobacco Use: Former    Smokeless Tobacco Use: Never    Passive Exposure: Not on Actuary Strain: Not on file  Food Insecurity: No Food Insecurity (05/25/2023)   Hunger Vital Sign    Worried About Running Out of Food in the Last Year: Never true    Ran Out  of Food in the Last Year: Never true  Transportation Needs: No Transportation Needs (05/25/2023)   PRAPARE - Administrator, Civil Service (Medical): No    Lack of Transportation (Non-Medical): No  Physical Activity: Not on file  Stress: No Stress Concern Present (04/03/2024)   [employer] of Occupational Health - Occupational Stress Questionnaire    Feeling of Stress: Not at all  Social Connections: Socially Isolated (05/25/2023)   Social Connection and Isolation Panel    Frequency of Communication with Friends and Family: Once a week    Frequency of Social Gatherings with Friends and Family: Once a week    Attends Religious Services: Never    Database Administrator or Organizations: No    Attends Banker Meetings: Never    Marital Status: Never married  Intimate Partner Violence: Not At Risk (05/25/2023)   Humiliation, Afraid, Rape, and Kick questionnaire    Fear of Current or Ex-Partner: No    Emotionally Abused: No    Physically Abused: No    Sexually Abused: No  Depression (PHQ2-9): Low Risk (04/07/2023)   Depression (PHQ2-9)    PHQ-2 Score: 0  Alcohol Screen: Not on file  Housing: Low Risk (05/25/2023)   Housing Stability Vital Sign    Unable to Pay for Housing in the Last Year: No    Number of Times Moved in the Last Year: 0    Homeless in the Last Year: No  Utilities: Not At Risk (05/25/2023)   AHC Utilities    Threatened with loss of utilities: No  Health Literacy: Adequate Health Literacy (04/03/2024)   B1300 Health Literacy    Frequency of need for help with medical instructions: Never   Tobacco Use  History[2] Social History   Substance and Sexual Activity  Alcohol Use Yes   Comment: socially    Family History  Problem Relation Age of Onset   Heart failure Mother    Heart disease Father    Breast cancer Sister 66       recur @ 4   Hypertension Sister    Prostate cancer Brother    Coronary artery disease Other        F, brothers x 2other family members   Colon cancer Neg Hx    Diabetes Neg Hx      ROS: Denies fever, fatigue, unexplained weight loss/gain, chest pain, SHOB, and palpitations. Denies neurological deficits, gastrointestinal or genitourinary complaints, and skin changes.   Objective:   Today's Vitals   04/03/24 1357 04/03/24 1535  BP: (!) 156/99 (!) 130/90  Pulse: 90   SpO2: 98%   Weight: 118 lb (53.5 kg)   Height: 5' 3 (1.6 m)     GENERAL APPEARANCE: Well-appearing, in NAD. Well nourished. SKIN: Pink, warm and dry. Turgor normal. Appropriate for age. No rash, lesion, ulceration, or ecchymoses. Hair evenly distributed.  HEENT: HEAD: Normocephalic.  EYES: PERRLA. EOMI. Lids intact w/o defect. Sclera white, Conjunctiva pink w/o exudate.  EARS: External ear w/o redness, swelling, masses or lesions. EAC clear. TM's intact, translucent w/o bulging, appropriate landmarks visualized. Appropriate acuity to conversational tones.  NOSE: Septum midline w/o deformity. Nares patent, mucosa pink and non-inflamed w/o drainage. No sinus tenderness.  THROAT: Uvula midline. Oropharynx clear. Tonsils non-inflamed w/o exudate. Oral mucosa pink and moist.  NECK: Supple, Trachea midline. Full ROM w/o pain or tenderness. No lymphadenopathy. Thyroid  non-tender w/o enlargement or palpable masses. BREASTS: Declined RESPIRATORY: Chest wall symmetrical w/o masses. Respirations even and  non-labored. Breath sounds clear to auscultation bilaterally. No wheezes, rales, rhonchi, or crackles. CARDIAC: S1, S2 present, regular rate and rhythm. No gallops, murmurs, rubs, or clicks. PMI w/o  lifts, heaves, or thrills. No carotid bruits. Capillary refill <2 seconds. Peripheral pulses 2+ bilaterally. GI: Abdomen soft w/o distention. Normoactive bowel sounds. No palpable masses or tenderness. No guarding or rebound tenderness. Liver and spleen w/o tenderness or enlargement. No CVA tenderness.  GU: Declined MSK: Muscle tone and strength appropriate for age, w/o atrophy or abnormal movement.  EXTREMITIES: Active ROM intact, w/o tenderness, crepitus, or contracture. No obvious joint deformities or effusions. No clubbing, edema, or cyanosis.  NEUROLOGIC: CN's II-XII intact. Motor strength symmetrical with no obvious weakness. No sensory deficits. Pt in a wheelchair from hip injury in February. Strength equal in bilateral lower extremities.  PSYCH/MENTAL STATUS: Alert, oriented x 3. Cooperative, appropriate mood and affect.   Results for orders placed or performed in visit on 02/14/24  CUP PACEART REMOTE DEVICE CHECK   Collection Time: 02/14/24  8:26 AM  Result Value Ref Range   Date Time Interrogation Session [phone]    Pulse Generator Manufacturer MERM    Pulse Gen Model ADDRL1 Adapta    Pulse Gen Serial Number [id number]    Clinic Name [facility]    Implantable Pulse Generator Type Implantable Pulse Generator    Implantable Pulse Generator Implant Date [id number]    Implantable Lead Manufacturer MERM    Implantable Lead Model 5076 CapSureFix Novus    Implantable Lead Serial Number [id number]    Implantable Lead Implant Date 79919681    Implantable Lead Location Detail 1 APPENDAGE    Implantable Lead Location [id number]    Implantable Lead Connection Status [id number]    Implantable Lead Manufacturer MERM    Implantable Lead Model 5076 CapSureFix Novus    Implantable Lead Serial Number [id number]    Implantable Lead Implant Date 79919681    Implantable Lead Location Detail 1 APEX    Implantable Lead Location [id number]    Implantable Lead Connection Status [id number]    Lead Channel  Setting Sensing Sensitivity 5.60 mV   Lead Channel Setting Pacing Pulse Width 0.40 ms   Lead Channel Setting Pacing Amplitude 2.500 V   Zone Setting Status 755011    Zone Setting Status 755011    Lead Channel Impedance Value 67 ohm   Lead Channel Impedance Value 546 ohm   Lead Channel Pacing Threshold Amplitude 1.000 V   Lead Channel Pacing Threshold Pulse Width 0.40 ms   Battery Status OK    Battery Remaining Longevity 88 mo   Battery Voltage 2.79 V   Battery Impedance 679 ohm   Brady Statistic RV Percent Paced 26 %    Assessment & Plan:  1. Encounter to establish care with new doctor (Primary) Discussed role of NP and expectations of the Primary Care Clinic. Discussed medical, surgical, and family history.   2. Annual physical exam Discussed preventative screenings, vaccines, lab work, and healthy lifestyle with patient.  - CBC with Differential/Platelet - Comprehensive metabolic panel with GFR - Hemoglobin A1c - Iron , TIBC and Ferritin Panel - VITAMIN D  25 Hydroxy (Vit-D Deficiency, Fractures) - TSH - Lipid panel  3. Primary hypertension Controlled on current regimen. Has an appointment with Cardiologist on 2/5.  4. Moderate mixed hyperlipidemia not requiring statin therapy Lab work today.  5. Hypercholesterolemia Lab work today.   PATIENT COUNSELING:  - Encouraged a healthy well-balanced diet. Patient may adjust caloric intake to maintain or achieve  ideal body weight. May reduce intake of dietary saturated fat and total fat and have adequate dietary potassium and calcium  preferably from fresh fruits, vegetables, and low-fat dairy products.   - Advised to avoid cigarette smoking. - Discussed with the patient that most people either abstain from alcohol or drink within safe limits (<=14/week and <=4 drinks/occasion for males, <=7/weeks and <= 3 drinks/occasion for females) and that the risk for alcohol disorders and other health effects rises proportionally with the  number of drinks per week and how often a drinker exceeds daily limits. - Discussed cessation/primary prevention of drug use and availability of treatment for abuse.  - Discussed sexually transmitted diseases, avoidance of unintended pregnancy and contraceptive alternatives. - Stressed the importance of regular exercise - Injury prevention: Discussed safety belts, safety helmets, smoke detector, smoking near bedding or upholstery.  - Dental health: Discussed importance of regular tooth brushing, flossing, and dental visits.   NEXT PREVENTATIVE PHYSICAL DUE IN 1 YEAR.  Return in about 3 months (around 07/02/2024) for follow up chronic conditions.  Patient to reach out to office if new, worrisome, or unresolved symptoms arise or if no improvement in patient's condition. Patient verbalized understanding and is agreeable to treatment plan. All questions answered to patient's satisfaction.    Lauraine Almarie Angus DNP, FNP-C      [1]  Allergies Allergen Reactions   Lisinopril Cough   Anesthetics, Ester Nausea And Vomiting    All Anesthetics   Other Dermatitis and Rash    Tegaderm (clear occlusive dressing) Pt was miserable  [2]  Social History Tobacco Use  Smoking Status Former   Current packs/day: 0.00   Types: Cigarettes   Quit date: 08/23/1988   Years since quitting: 35.6  Smokeless Tobacco Never  Tobacco Comments   >20 yrs ago, used to smoke ~ 1 ppd   "

## 2024-04-04 LAB — CBC WITH DIFFERENTIAL/PLATELET
Basophils Absolute: 0.1 x10E3/uL (ref 0.0–0.2)
Basos: 1 %
EOS (ABSOLUTE): 0.1 x10E3/uL (ref 0.0–0.4)
Eos: 1 %
Hematocrit: 44.5 % (ref 34.0–46.6)
Hemoglobin: 14.8 g/dL (ref 11.1–15.9)
Immature Grans (Abs): 0 x10E3/uL (ref 0.0–0.1)
Immature Granulocytes: 0 %
Lymphocytes Absolute: 2.2 x10E3/uL (ref 0.7–3.1)
Lymphs: 26 %
MCH: 29 pg (ref 26.6–33.0)
MCHC: 33.3 g/dL (ref 31.5–35.7)
MCV: 87 fL (ref 79–97)
Monocytes Absolute: 0.9 x10E3/uL (ref 0.1–0.9)
Monocytes: 10 %
Neutrophils Absolute: 5.4 x10E3/uL (ref 1.4–7.0)
Neutrophils: 62 %
Platelets: 223 x10E3/uL (ref 150–450)
RBC: 5.11 x10E6/uL (ref 3.77–5.28)
RDW: 13.4 % (ref 11.7–15.4)
WBC: 8.7 x10E3/uL (ref 3.4–10.8)

## 2024-04-04 LAB — COMPREHENSIVE METABOLIC PANEL WITH GFR
ALT: 26 IU/L (ref 0–32)
AST: 34 IU/L (ref 0–40)
Albumin: 4.2 g/dL (ref 3.6–4.6)
Alkaline Phosphatase: 109 IU/L (ref 48–129)
BUN/Creatinine Ratio: 27 (ref 12–28)
BUN: 17 mg/dL (ref 10–36)
Bilirubin Total: 0.7 mg/dL (ref 0.0–1.2)
CO2: 24 mmol/L (ref 20–29)
Calcium: 10.2 mg/dL (ref 8.7–10.3)
Chloride: 102 mmol/L (ref 96–106)
Creatinine, Ser: 0.64 mg/dL (ref 0.57–1.00)
Globulin, Total: 2.7 g/dL (ref 1.5–4.5)
Glucose: 86 mg/dL (ref 70–99)
Potassium: 5 mmol/L (ref 3.5–5.2)
Sodium: 141 mmol/L (ref 134–144)
Total Protein: 6.9 g/dL (ref 6.0–8.5)
eGFR: 81 mL/min/1.73

## 2024-04-04 LAB — TSH: TSH: 2.67 u[IU]/mL (ref 0.450–4.500)

## 2024-04-04 LAB — IRON,TIBC AND FERRITIN PANEL
Ferritin: 211 ng/mL — ABNORMAL HIGH (ref 15–150)
Iron Saturation: 20 % (ref 15–55)
Iron: 61 ug/dL (ref 27–139)
Total Iron Binding Capacity: 306 ug/dL (ref 250–450)
UIBC: 245 ug/dL (ref 118–369)

## 2024-04-04 LAB — LIPID PANEL
Chol/HDL Ratio: 2 ratio (ref 0.0–4.4)
Cholesterol, Total: 206 mg/dL — ABNORMAL HIGH (ref 100–199)
HDL: 101 mg/dL
LDL Chol Calc (NIH): 94 mg/dL (ref 0–99)
Triglycerides: 59 mg/dL (ref 0–149)
VLDL Cholesterol Cal: 11 mg/dL (ref 5–40)

## 2024-04-04 LAB — HEMOGLOBIN A1C
Est. average glucose Bld gHb Est-mCnc: 117 mg/dL
Hgb A1c MFr Bld: 5.7 % — ABNORMAL HIGH (ref 4.8–5.6)

## 2024-04-04 LAB — VITAMIN D 25 HYDROXY (VIT D DEFICIENCY, FRACTURES): Vit D, 25-Hydroxy: 70.7 ng/mL (ref 30.0–100.0)

## 2024-04-05 ENCOUNTER — Ambulatory Visit (HOSPITAL_BASED_OUTPATIENT_CLINIC_OR_DEPARTMENT_OTHER): Payer: Self-pay

## 2024-04-05 NOTE — Progress Notes (Signed)
 Hi! Can we call the patient and let her know that all of her lab work looks close to perfect! I will follow up with her as scheduled! Thanks!

## 2024-04-08 ENCOUNTER — Encounter (HOSPITAL_BASED_OUTPATIENT_CLINIC_OR_DEPARTMENT_OTHER): Payer: Self-pay

## 2024-04-09 NOTE — Progress Notes (Signed)
 Called and spoke with patient on the phone using two identifiers. Explained lab work results in further detail per patient request. Patient had no further questions. Will keep scheduled follow up appointment.   Lauraine Almarie Angus DNP, FNP-C

## 2024-04-16 ENCOUNTER — Other Ambulatory Visit: Payer: Self-pay | Admitting: Cardiology

## 2024-04-16 DIAGNOSIS — I480 Paroxysmal atrial fibrillation: Secondary | ICD-10-CM

## 2024-05-09 ENCOUNTER — Ambulatory Visit: Admitting: Medical

## 2024-05-09 VITALS — BP 118/62 | HR 81 | Ht 63.0 in | Wt 113.0 lb

## 2024-05-09 DIAGNOSIS — I480 Paroxysmal atrial fibrillation: Secondary | ICD-10-CM

## 2024-05-09 DIAGNOSIS — Z950 Presence of cardiac pacemaker: Secondary | ICD-10-CM

## 2024-05-09 DIAGNOSIS — I4821 Permanent atrial fibrillation: Secondary | ICD-10-CM

## 2024-05-09 LAB — CUP PACEART INCLINIC DEVICE CHECK
Battery Impedance: 703 Ohm
Battery Remaining Longevity: 85 mo
Battery Voltage: 2.78 V
Brady Statistic RV Percent Paced: 25 %
Date Time Interrogation Session: 20260205155708
Implantable Lead Connection Status: 753985
Implantable Lead Connection Status: 753985
Implantable Lead Implant Date: 20080318
Implantable Lead Implant Date: 20080318
Implantable Lead Location: 753859
Implantable Lead Location: 753860
Implantable Lead Model: 5076
Implantable Lead Model: 5076
Implantable Pulse Generator Implant Date: 20170825
Lead Channel Impedance Value: 493 Ohm
Lead Channel Impedance Value: 67 Ohm
Lead Channel Pacing Threshold Amplitude: 0.75 V
Lead Channel Pacing Threshold Amplitude: 1 V
Lead Channel Pacing Threshold Pulse Width: 0.4 ms
Lead Channel Pacing Threshold Pulse Width: 0.46 ms
Lead Channel Sensing Intrinsic Amplitude: 15.67 mV
Lead Channel Setting Pacing Amplitude: 2 V
Lead Channel Setting Pacing Pulse Width: 0.46 ms
Lead Channel Setting Sensing Sensitivity: 5.6 mV
Zone Setting Status: 755011
Zone Setting Status: 755011

## 2024-05-09 NOTE — Patient Instructions (Signed)
 Medication Instructions:   Your physician recommends that you continue on your current medications as directed. Please refer to the Current Medication list given to you today.   If you need a refill on your cardiac medications before your next appointment, please call your pharmacy*   Lab Work: NONE ORDERED  TODAY    If you have labs (blood work) drawn today and your tests are completely normal, you will receive your results only by: MyChart Message (if you have MyChart) OR A paper copy in the mail If you have any lab test that is abnormal or we need to change your treatment, we will call you to review the results.   Testing/Procedures: NONE ORDERED  TODAY     Follow-Up: At [facility], you and your health needs are our priority.  As part of our continuing mission to provide you with exceptional heart care, our providers are all part of one team.  This team includes your primary Cardiologist (physician) and Advanced Practice Providers or APPs (Physician Assistants and Nurse Practitioners) who all work together to provide you with the care you need, when you need it.  Your next appointment:    18  month(s)  Provider:    You may see Will Gladis Norton, MD or one of the following Advanced Practice Providers on your designated Care Team:   Odis [place], [place]   We recommend signing up for the patient portal called MyChart.  Sign up information is provided on this After Visit Summary.  MyChart is used to connect with patients for Virtual Visits (Telemedicine).  Patients are able to view lab/test results, encounter notes, upcoming appointments, etc.  Non-urgent messages can be sent to your provider as well.   To learn more about what you can do with MyChart, go to [email or web address].   Other Instructions

## 2024-05-09 NOTE — Assessment & Plan Note (Signed)
 Normal Pacer function VVIR 60-120 with sleep rate at 50

## 2024-05-15 ENCOUNTER — Ambulatory Visit

## 2024-05-17 ENCOUNTER — Ambulatory Visit: Admitting: Cardiology

## 2024-07-02 ENCOUNTER — Ambulatory Visit: Admitting: Internal Medicine

## 2024-07-02 ENCOUNTER — Ambulatory Visit (HOSPITAL_BASED_OUTPATIENT_CLINIC_OR_DEPARTMENT_OTHER)

## 2024-08-14 ENCOUNTER — Ambulatory Visit

## 2024-11-13 ENCOUNTER — Ambulatory Visit

## 2025-02-12 ENCOUNTER — Ambulatory Visit

## 2025-05-14 ENCOUNTER — Ambulatory Visit
